# Patient Record
Sex: Female | Born: 1950 | Race: Black or African American | Hispanic: No | Marital: Single | Smoking: Current every day smoker
Health system: Southern US, Community
[De-identification: ages and names within clinical notes are randomized; demographics above are authoritative.]

## PROBLEM LIST (undated history)

## (undated) DIAGNOSIS — C801 Malignant (primary) neoplasm, unspecified: Secondary | ICD-10-CM

## (undated) DIAGNOSIS — M51369 Other intervertebral disc degeneration, lumbar region without mention of lumbar back pain or lower extremity pain: Secondary | ICD-10-CM

## (undated) DIAGNOSIS — F419 Anxiety disorder, unspecified: Secondary | ICD-10-CM

## (undated) DIAGNOSIS — T7840XA Allergy, unspecified, initial encounter: Secondary | ICD-10-CM

## (undated) DIAGNOSIS — E785 Hyperlipidemia, unspecified: Secondary | ICD-10-CM

## (undated) DIAGNOSIS — K219 Gastro-esophageal reflux disease without esophagitis: Secondary | ICD-10-CM

## (undated) DIAGNOSIS — G709 Myoneural disorder, unspecified: Secondary | ICD-10-CM

## (undated) DIAGNOSIS — I10 Essential (primary) hypertension: Secondary | ICD-10-CM

## (undated) DIAGNOSIS — M5136 Other intervertebral disc degeneration, lumbar region: Secondary | ICD-10-CM

## (undated) HISTORY — DX: Gastro-esophageal reflux disease without esophagitis: K21.9

## (undated) HISTORY — DX: Myoneural disorder, unspecified: G70.9

## (undated) HISTORY — PX: CHOLECYSTECTOMY: SHX55

## (undated) HISTORY — DX: Allergy, unspecified, initial encounter: T78.40XA

## (undated) HISTORY — DX: Malignant (primary) neoplasm, unspecified: C80.1

## (undated) HISTORY — DX: Hyperlipidemia, unspecified: E78.5

## (undated) HISTORY — DX: Anxiety disorder, unspecified: F41.9

## (undated) HISTORY — DX: Other intervertebral disc degeneration, lumbar region without mention of lumbar back pain or lower extremity pain: M51.369

## (undated) HISTORY — DX: Other intervertebral disc degeneration, lumbar region: M51.36

## (undated) HISTORY — PX: ABDOMINAL HYSTERECTOMY: SHX81

---

## 2007-12-20 ENCOUNTER — Emergency Department (HOSPITAL_COMMUNITY): Admission: EM | Admit: 2007-12-20 | Discharge: 2007-12-20 | Payer: Self-pay | Admitting: Emergency Medicine

## 2008-09-25 ENCOUNTER — Emergency Department (HOSPITAL_COMMUNITY): Admission: EM | Admit: 2008-09-25 | Discharge: 2008-09-25 | Payer: Self-pay | Admitting: Emergency Medicine

## 2008-10-30 ENCOUNTER — Encounter: Admission: RE | Admit: 2008-10-30 | Discharge: 2008-10-30 | Payer: Self-pay | Admitting: Internal Medicine

## 2009-04-22 ENCOUNTER — Encounter: Admission: RE | Admit: 2009-04-22 | Discharge: 2009-04-22 | Payer: Self-pay | Admitting: Internal Medicine

## 2009-10-08 ENCOUNTER — Inpatient Hospital Stay (HOSPITAL_COMMUNITY): Admission: EM | Admit: 2009-10-08 | Discharge: 2009-10-10 | Payer: Self-pay | Admitting: Emergency Medicine

## 2010-03-24 ENCOUNTER — Inpatient Hospital Stay (HOSPITAL_COMMUNITY): Admission: EM | Admit: 2010-03-24 | Discharge: 2010-03-27 | Payer: Self-pay | Admitting: Emergency Medicine

## 2010-03-25 ENCOUNTER — Encounter (INDEPENDENT_AMBULATORY_CARE_PROVIDER_SITE_OTHER): Payer: Self-pay | Admitting: General Surgery

## 2010-04-12 ENCOUNTER — Emergency Department (HOSPITAL_COMMUNITY): Admission: EM | Admit: 2010-04-12 | Discharge: 2010-04-13 | Payer: Self-pay | Admitting: Emergency Medicine

## 2010-08-04 ENCOUNTER — Observation Stay (HOSPITAL_COMMUNITY)
Admission: EM | Admit: 2010-08-04 | Discharge: 2010-08-08 | Payer: Self-pay | Source: Home / Self Care | Attending: Internal Medicine | Admitting: Internal Medicine

## 2010-08-05 ENCOUNTER — Encounter (INDEPENDENT_AMBULATORY_CARE_PROVIDER_SITE_OTHER): Payer: Self-pay | Admitting: Internal Medicine

## 2010-08-20 ENCOUNTER — Encounter
Admission: RE | Admit: 2010-08-20 | Discharge: 2010-09-09 | Payer: Self-pay | Source: Home / Self Care | Attending: Internal Medicine | Admitting: Internal Medicine

## 2010-09-09 ENCOUNTER — Encounter
Admission: RE | Admit: 2010-09-09 | Discharge: 2010-09-29 | Payer: Self-pay | Source: Home / Self Care | Attending: Internal Medicine | Admitting: Internal Medicine

## 2010-10-02 ENCOUNTER — Other Ambulatory Visit: Payer: Self-pay | Admitting: General Surgery

## 2010-10-02 DIAGNOSIS — E041 Nontoxic single thyroid nodule: Secondary | ICD-10-CM

## 2010-10-15 ENCOUNTER — Other Ambulatory Visit: Payer: Self-pay

## 2010-10-22 ENCOUNTER — Other Ambulatory Visit (HOSPITAL_COMMUNITY)
Admission: RE | Admit: 2010-10-22 | Discharge: 2010-10-22 | Disposition: A | Discharge status: Home / Self Care | Payer: Medicaid Other | Source: Ambulatory Visit | Attending: Diagnostic Radiology | Admitting: Diagnostic Radiology

## 2010-10-22 ENCOUNTER — Ambulatory Visit
Admission: RE | Admit: 2010-10-22 | Discharge: 2010-10-22 | Disposition: A | Discharge status: Home / Self Care | Payer: Medicaid Other | Source: Ambulatory Visit | Attending: General Surgery | Admitting: General Surgery

## 2010-10-22 ENCOUNTER — Other Ambulatory Visit: Payer: Self-pay | Admitting: Diagnostic Radiology

## 2010-10-22 DIAGNOSIS — E041 Nontoxic single thyroid nodule: Secondary | ICD-10-CM

## 2010-10-22 DIAGNOSIS — E049 Nontoxic goiter, unspecified: Secondary | ICD-10-CM | POA: Insufficient documentation

## 2010-11-03 ENCOUNTER — Other Ambulatory Visit (HOSPITAL_COMMUNITY): Payer: Medicaid Other

## 2010-11-07 ENCOUNTER — Encounter (HOSPITAL_COMMUNITY): Payer: Medicaid Other

## 2010-11-07 ENCOUNTER — Other Ambulatory Visit: Payer: Self-pay | Admitting: General Surgery

## 2010-11-07 DIAGNOSIS — Z01818 Encounter for other preprocedural examination: Secondary | ICD-10-CM | POA: Insufficient documentation

## 2010-11-07 DIAGNOSIS — Z01812 Encounter for preprocedural laboratory examination: Secondary | ICD-10-CM | POA: Insufficient documentation

## 2010-11-07 LAB — SURGICAL PCR SCREEN: Staphylococcus aureus: POSITIVE — AB

## 2010-11-07 LAB — CBC
HCT: 38.4 % (ref 36.0–46.0)
MCH: 28.9 pg (ref 26.0–34.0)
MCV: 92.5 fL (ref 78.0–100.0)
Platelets: 231 10*3/uL (ref 150–400)
RBC: 4.15 MIL/uL (ref 3.87–5.11)

## 2010-11-07 LAB — BASIC METABOLIC PANEL
BUN: 23 mg/dL (ref 6–23)
Chloride: 105 mEq/L (ref 96–112)
Creatinine, Ser: 1.64 mg/dL — ABNORMAL HIGH (ref 0.4–1.2)
Glucose, Bld: 179 mg/dL — ABNORMAL HIGH (ref 70–99)

## 2010-11-10 ENCOUNTER — Ambulatory Visit (HOSPITAL_COMMUNITY)
Admission: RE | Admit: 2010-11-10 | Discharge: 2010-11-11 | Disposition: A | Discharge status: Home / Self Care | Payer: Medicaid Other | Source: Ambulatory Visit | Attending: General Surgery | Admitting: General Surgery

## 2010-11-10 DIAGNOSIS — E119 Type 2 diabetes mellitus without complications: Secondary | ICD-10-CM | POA: Insufficient documentation

## 2010-11-10 DIAGNOSIS — Z794 Long term (current) use of insulin: Secondary | ICD-10-CM | POA: Insufficient documentation

## 2010-11-10 DIAGNOSIS — I10 Essential (primary) hypertension: Secondary | ICD-10-CM | POA: Insufficient documentation

## 2010-11-10 DIAGNOSIS — K219 Gastro-esophageal reflux disease without esophagitis: Secondary | ICD-10-CM | POA: Insufficient documentation

## 2010-11-10 DIAGNOSIS — Z01812 Encounter for preprocedural laboratory examination: Secondary | ICD-10-CM | POA: Insufficient documentation

## 2010-11-10 DIAGNOSIS — R110 Nausea: Secondary | ICD-10-CM | POA: Insufficient documentation

## 2010-11-10 DIAGNOSIS — D34 Benign neoplasm of thyroid gland: Secondary | ICD-10-CM | POA: Insufficient documentation

## 2010-11-10 DIAGNOSIS — E669 Obesity, unspecified: Secondary | ICD-10-CM | POA: Insufficient documentation

## 2010-11-10 LAB — GLUCOSE, CAPILLARY
Glucose-Capillary: 192 mg/dL — ABNORMAL HIGH (ref 70–99)
Glucose-Capillary: 166 mg/dL — ABNORMAL HIGH (ref 70–99)
Glucose-Capillary: 269 mg/dL — ABNORMAL HIGH (ref 70–99)

## 2010-11-10 LAB — URINALYSIS, ROUTINE W REFLEX MICROSCOPIC
Bilirubin Urine: NEGATIVE
Glucose, UA: NEGATIVE mg/dL
Hgb urine dipstick: NEGATIVE
Specific Gravity, Urine: 1.017 (ref 1.005–1.030)
Urobilinogen, UA: 1 mg/dL (ref 0.0–1.0)
pH: 5.5 (ref 5.0–8.0)

## 2010-11-11 ENCOUNTER — Other Ambulatory Visit: Payer: Self-pay | Admitting: General Surgery

## 2010-11-11 LAB — CBC
HCT: 34.4 % — ABNORMAL LOW (ref 36.0–46.0)
HCT: 35.2 % — ABNORMAL LOW (ref 36.0–46.0)
HCT: 35.5 % — ABNORMAL LOW (ref 36.0–46.0)
HCT: 37.6 % (ref 36.0–46.0)
Hemoglobin: 11.3 g/dL — ABNORMAL LOW (ref 12.0–15.0)
Hemoglobin: 12 g/dL (ref 12.0–15.0)
MCH: 29.4 pg (ref 26.0–34.0)
MCHC: 32.8 g/dL (ref 30.0–36.0)
MCHC: 33.8 g/dL (ref 30.0–36.0)
MCHC: 33.8 g/dL (ref 30.0–36.0)
MCHC: 34.3 g/dL (ref 30.0–36.0)
MCV: 87 fL (ref 78.0–100.0)
RBC: 3.85 MIL/uL — ABNORMAL LOW (ref 3.87–5.11)
RDW: 14 % (ref 11.5–15.5)
RDW: 14.2 % (ref 11.5–15.5)
WBC: 12.8 10*3/uL — ABNORMAL HIGH (ref 4.0–10.5)

## 2010-11-11 LAB — BASIC METABOLIC PANEL
BUN: 13 mg/dL (ref 6–23)
BUN: 17 mg/dL (ref 6–23)
CO2: 25 mEq/L (ref 19–32)
CO2: 25 mEq/L (ref 19–32)
Calcium: 10.3 mg/dL (ref 8.4–10.5)
Chloride: 107 mEq/L (ref 96–112)
GFR calc Af Amer: 45 mL/min — ABNORMAL LOW (ref >60)
GFR calc Af Amer: 51 mL/min — ABNORMAL LOW (ref >60)
GFR calc non Af Amer: 39 mL/min — ABNORMAL LOW (ref >60)
Glucose, Bld: 155 mg/dL — ABNORMAL HIGH (ref 70–99)
Glucose, Bld: 305 mg/dL — ABNORMAL HIGH (ref 70–99)
Potassium: 4.8 mEq/L (ref 3.5–5.1)
Potassium: 4.8 mEq/L (ref 3.5–5.1)
Sodium: 137 mEq/L (ref 135–145)
Sodium: 138 mEq/L (ref 135–145)
Sodium: 138 mEq/L (ref 135–145)

## 2010-11-11 LAB — GLUCOSE, CAPILLARY
Glucose-Capillary: 108 mg/dL — ABNORMAL HIGH (ref 70–99)
Glucose-Capillary: 130 mg/dL — ABNORMAL HIGH (ref 70–99)
Glucose-Capillary: 174 mg/dL — ABNORMAL HIGH (ref 70–99)
Glucose-Capillary: 177 mg/dL — ABNORMAL HIGH (ref 70–99)
Glucose-Capillary: 224 mg/dL — ABNORMAL HIGH (ref 70–99)
Glucose-Capillary: 313 mg/dL — ABNORMAL HIGH (ref 70–99)
Glucose-Capillary: 83 mg/dL (ref 70–99)
Glucose-Capillary: 351 mg/dL — ABNORMAL HIGH (ref 70–99)

## 2010-11-11 LAB — DIFFERENTIAL
Basophils Absolute: 0 10*3/uL (ref 0.0–0.1)
Basophils Absolute: 0 10*3/uL (ref 0.0–0.1)
Basophils Relative: 0 % (ref 0–1)
Basophils Relative: 0 % (ref 0–1)
Lymphocytes Relative: 23 % (ref 12–46)
Monocytes Absolute: 0.3 10*3/uL (ref 0.1–1.0)
Neutro Abs: 7.9 10*3/uL — ABNORMAL HIGH (ref 1.7–7.7)
Neutro Abs: 9.5 10*3/uL — ABNORMAL HIGH (ref 1.7–7.7)
Neutrophils Relative %: 55 % (ref 43–77)
Neutrophils Relative %: 75 % (ref 43–77)

## 2010-11-11 LAB — COMPREHENSIVE METABOLIC PANEL
ALT: 50 U/L — ABNORMAL HIGH (ref 0–35)
Alkaline Phosphatase: 108 U/L (ref 39–117)
Alkaline Phosphatase: 97 U/L (ref 39–117)
BUN: 18 mg/dL (ref 6–23)
CO2: 23 mEq/L (ref 19–32)
Calcium: 9.8 mg/dL (ref 8.4–10.5)
Chloride: 104 mEq/L (ref 96–112)
Chloride: 98 mEq/L (ref 96–112)
GFR calc non Af Amer: 41 mL/min — ABNORMAL LOW (ref >60)
Glucose, Bld: 124 mg/dL — ABNORMAL HIGH (ref 70–99)
Glucose, Bld: 152 mg/dL — ABNORMAL HIGH (ref 70–99)
Potassium: 4.6 mEq/L (ref 3.5–5.1)
Sodium: 138 mEq/L (ref 135–145)
Total Bilirubin: 0.7 mg/dL (ref 0.3–1.2)
Total Bilirubin: 1 mg/dL (ref 0.3–1.2)

## 2010-11-11 LAB — HEMOGLOBIN A1C: Hgb A1c MFr Bld: 8 % — ABNORMAL HIGH (ref <5.7)

## 2010-11-11 LAB — HEPATIC FUNCTION PANEL
ALT: 54 U/L — ABNORMAL HIGH (ref 0–35)
AST: 42 U/L — ABNORMAL HIGH (ref 0–37)
Bilirubin, Direct: 0.1 mg/dL (ref 0.0–0.3)
Total Bilirubin: 0.7 mg/dL (ref 0.3–1.2)

## 2010-11-11 LAB — CARDIAC PANEL(CRET KIN+CKTOT+MB+TROPI)
CK, MB: 1.1 ng/mL (ref 0.3–4.0)
CK, MB: 1.2 ng/mL (ref 0.3–4.0)
Relative Index: INVALID (ref 0.0–2.5)
Troponin I: 0.02 ng/mL (ref 0.00–0.06)
Troponin I: 0.03 ng/mL (ref 0.00–0.06)

## 2010-11-11 LAB — MAGNESIUM: Magnesium: 1.5 mg/dL (ref 1.5–2.5)

## 2010-11-11 LAB — LIPID PANEL
LDL Cholesterol: 81 mg/dL (ref 0–99)
VLDL: 55 mg/dL — ABNORMAL HIGH (ref 0–40)

## 2010-11-11 LAB — URINE MICROSCOPIC-ADD ON

## 2010-11-11 LAB — URINALYSIS, ROUTINE W REFLEX MICROSCOPIC
Leukocytes, UA: NEGATIVE
Nitrite: NEGATIVE
pH: 5.5 (ref 5.0–8.0)

## 2010-11-11 LAB — CK TOTAL AND CKMB (NOT AT ARMC): Total CK: 76 U/L (ref 7–177)

## 2010-11-11 LAB — POCT PREGNANCY, URINE: Preg Test, Ur: NEGATIVE

## 2010-11-11 NOTE — Op Note (Signed)
NAMEHARVI, CULVER                ACCOUNT NO.:  [id number]  MEDICAL RECORD NO.:  [id number]           PATIENT TYPE:  O  LOCATION:  [id number]                         FACILITY:  [facility]  PHYSICIAN:  Marland Kitchen T. Ajay Strubel, M.D.DATE OF BIRTH:  03/26/1951  DATE OF PROCEDURE: DATE OF DISCHARGE:                              OPERATIVE REPORT   PREOPERATIVE DIAGNOSIS:  Left thyroid nodule.  POSTOPERATIVE DIAGNOSIS:  Left thyroid nodule.  SURGICAL PROCEDURE:  Left thyroid lobectomy.  SURGEON:  Darene Lamer. Valla Pacey, MD  ASSISTANT:  Orson Ape. Rise Patience, MD  ANESTHESIA:  General.  BRIEF HISTORY:  [name] is a 60 year old female who on a recent workup for neck and thoracic pain underwent MRI that incidentally showed approximately 2-cm nodule in the left lower lobe of the thyroid.  We obtained a thyroid ultrasound which confirmed an approximately 2-1/2 cm nodule in the lower pole of the left thyroid lobe.  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  With this constellation of findings, we discussed observation versus thyroidectomy and elected to proceed with thyroid lobectomy and possible total thyroidectomy.  The nature of the procedure, its indications, alternatives, risks of anesthetic complications, bleeding, infection, possible need for delayed surgery based on pathology findings and risks of permanent hoarseness and hypocalcemia have been discussed and understood.  She is now brought to the operating room for the procedure.  DESCRIPTION OF OPERATION:  The patient was brought to the operating room, placed in supine position on the operating table, and general endotracheal anesthesia was introduced.  Her neck was positioned carefully, extended, padded, and the entire neck, upper chest, chin were widely, sterilely prepped and draped.  Correct patient and procedure were verified.  I made a curvilinear incision in the anterior  neck midway between the sternal notch and thyroid cartilage and dissection was carried down the through subcutaneous tissue and platysma using cautery.  Subplatysmal flaps were then raised superiorly up to the thyroid cartilage, inferior down to the sternal notch, and laterally out to the borders of sternocleidomastoid.  The Mahorner retractor was then placed and the strap muscles were divided in the avascular plane along the midline and dissection carried down on the anterior thyroid.  The left lobe was exposed with careful blunt dissection anteriorly.  There was a good size, somewhat firm nodule, palpable in the lower pole. Strap muscles were retracted laterally using careful blunt dissection, anterior thyroid gland was fully exposed.  The middle thyroid vein was identified, dissected free, clipped proximally, and divided with the Harmonic scalpel.  The gland was then further mobilized bluntly and the nodule which was in extreme inferior pole was bluntly mobilized somewhat up out of the neck.  Attention was turned to the upper pole and the superior thyroid vessels were exposed and with careful blunt dissection isolated and were divided between proximal clips and Harmonic scalpel fully mobilizing the upper pole.  Some further dissection was then done around the nodule in the lower pole medially further mobilizing the inferior gland and then attention was turned to the middle thyroid vessels.  The tracheoesophageal groove was  identified and with careful blunt dissection, we identified which we felt was the recurrent laryngeal nerve in the tracheoesophageal groove.  Staying medial to this individual branches of the inferior thyroid artery were divided between baby clips proximally and the Harmonic scalpel mobilizing the thyroid up out of the tracheoesophageal groove.  A very small amount of thyroid tissue was dissected left just over the top of the insertion of the nerve.  Dissection was  continued back toward the trachea and as the suspensory ligament of Gwenlyn Found was approached.  This was divided with cautery, mobilizing the gland and isthmus up off the trachea and the isthmus was divided over toward the right lobe with Harmonic scalpel. The specimen was oriented and sent for frozen section.  Frozen section showed a follicular lesion with no evidence of capsular invasion on the sections identified, I elected to stop that lobectomy, as planned.  The neck was irrigated and complete hemostasis obtained and Fibrillar hemostatic agent placed in the tracheoesophageal groove.  The strap muscles were then closed in the midline with interrupted 3-0 Vicryl. Scarpa fascia was closed with interrupted 3-0 Vicryl.  The skin was closed with subcuticular Monocryl and Dermabond.  Sponge and needle counts were correct.  The patient taken to Recovery in good condition.     Darene Lamer. Coy Rochford, M.D.     [place]  D:  11/10/2010  T:  11/10/2010  Job:  [id number]  Electronically Signed by Excell Seltzer M.D. on 11/11/2010 07:49:00 PM

## 2010-11-14 LAB — COMPREHENSIVE METABOLIC PANEL
BUN: 17 mg/dL (ref 6–23)
CO2: 25 mEq/L (ref 19–32)
Calcium: 9.9 mg/dL (ref 8.4–10.5)
Creatinine, Ser: 1.53 mg/dL — ABNORMAL HIGH (ref 0.4–1.2)
GFR calc Af Amer: 42 mL/min — ABNORMAL LOW (ref >60)
GFR calc non Af Amer: 35 mL/min — ABNORMAL LOW (ref >60)
Glucose, Bld: 180 mg/dL — ABNORMAL HIGH (ref 70–99)
Total Bilirubin: 1.1 mg/dL (ref 0.3–1.2)

## 2010-11-14 LAB — GLUCOSE, CAPILLARY: Glucose-Capillary: 172 mg/dL — ABNORMAL HIGH (ref 70–99)

## 2010-11-14 LAB — DIFFERENTIAL
Basophils Absolute: 0.1 10*3/uL (ref 0.0–0.1)
Lymphocytes Relative: 35 % (ref 12–46)
Lymphs Abs: 5.1 10*3/uL — ABNORMAL HIGH (ref 0.7–4.0)
Neutro Abs: 8 10*3/uL — ABNORMAL HIGH (ref 1.7–7.7)
Neutrophils Relative %: 55 % (ref 43–77)

## 2010-11-14 LAB — URINALYSIS, ROUTINE W REFLEX MICROSCOPIC
Bilirubin Urine: NEGATIVE
Hgb urine dipstick: NEGATIVE
Ketones, ur: NEGATIVE mg/dL
Nitrite: NEGATIVE
pH: 7 (ref 5.0–8.0)

## 2010-11-14 LAB — CBC
Hemoglobin: 12.2 g/dL (ref 12.0–15.0)
MCH: 30.1 pg (ref 26.0–34.0)
MCHC: 34 g/dL (ref 30.0–36.0)
MCV: 88.4 fL (ref 78.0–100.0)

## 2010-11-15 LAB — CBC
HCT: 33.2 % — ABNORMAL LOW (ref 36.0–46.0)
HCT: 35 % — ABNORMAL LOW (ref 36.0–46.0)
HCT: 36.5 % (ref 36.0–46.0)
Hemoglobin: 11.3 g/dL — ABNORMAL LOW (ref 12.0–15.0)
Hemoglobin: 11.6 g/dL — ABNORMAL LOW (ref 12.0–15.0)
Hemoglobin: 12.6 g/dL (ref 12.0–15.0)
MCH: 30.2 pg (ref 26.0–34.0)
MCHC: 33.9 g/dL (ref 30.0–36.0)
MCHC: 34.6 g/dL (ref 30.0–36.0)
MCV: 88.7 fL (ref 78.0–100.0)
MCV: 89.6 fL (ref 78.0–100.0)
RBC: 3.84 MIL/uL — ABNORMAL LOW (ref 3.87–5.11)
RDW: 15 % (ref 11.5–15.5)
RDW: 15 % (ref 11.5–15.5)
WBC: 12.5 10*3/uL — ABNORMAL HIGH (ref 4.0–10.5)
WBC: 13.9 10*3/uL — ABNORMAL HIGH (ref 4.0–10.5)
WBC: 14.8 10*3/uL — ABNORMAL HIGH (ref 4.0–10.5)
WBC: 15.3 10*3/uL — ABNORMAL HIGH (ref 4.0–10.5)

## 2010-11-15 LAB — DIFFERENTIAL
Basophils Absolute: 0 10*3/uL (ref 0.0–0.1)
Basophils Relative: 0 % (ref 0–1)
Eosinophils Absolute: 0.1 10*3/uL (ref 0.0–0.7)
Lymphocytes Relative: 33 % (ref 12–46)
Lymphs Abs: 4.9 10*3/uL — ABNORMAL HIGH (ref 0.7–4.0)
Monocytes Absolute: 0.7 10*3/uL (ref 0.1–1.0)
Neutro Abs: 9.1 10*3/uL — ABNORMAL HIGH (ref 1.7–7.7)

## 2010-11-15 LAB — URINALYSIS, ROUTINE W REFLEX MICROSCOPIC
Glucose, UA: NEGATIVE mg/dL
Nitrite: NEGATIVE
pH: 5 (ref 5.0–8.0)

## 2010-11-15 LAB — COMPREHENSIVE METABOLIC PANEL
ALT: 72 U/L — ABNORMAL HIGH (ref 0–35)
ALT: 83 U/L — ABNORMAL HIGH (ref 0–35)
ALT: 88 U/L — ABNORMAL HIGH (ref 0–35)
AST: 69 U/L — ABNORMAL HIGH (ref 0–37)
AST: 74 U/L — ABNORMAL HIGH (ref 0–37)
Albumin: 4 g/dL (ref 3.5–5.2)
Alkaline Phosphatase: 74 U/L (ref 39–117)
Alkaline Phosphatase: 86 U/L (ref 39–117)
CO2: 22 mEq/L (ref 19–32)
CO2: 28 mEq/L (ref 19–32)
CO2: 29 mEq/L (ref 19–32)
Calcium: 9.9 mg/dL (ref 8.4–10.5)
Chloride: 101 mEq/L (ref 96–112)
Chloride: 97 mEq/L (ref 96–112)
Creatinine, Ser: 1.48 mg/dL — ABNORMAL HIGH (ref 0.4–1.2)
GFR calc Af Amer: 44 mL/min — ABNORMAL LOW (ref >60)
GFR calc Af Amer: 45 mL/min — ABNORMAL LOW (ref >60)
GFR calc non Af Amer: 36 mL/min — ABNORMAL LOW (ref >60)
GFR calc non Af Amer: 38 mL/min — ABNORMAL LOW (ref >60)
GFR calc non Af Amer: 40 mL/min — ABNORMAL LOW (ref >60)
Glucose, Bld: 154 mg/dL — ABNORMAL HIGH (ref 70–99)
Potassium: 4.6 mEq/L (ref 3.5–5.1)
Potassium: 5 mEq/L (ref 3.5–5.1)
Sodium: 131 mEq/L — ABNORMAL LOW (ref 135–145)
Sodium: 138 mEq/L (ref 135–145)
Sodium: 138 mEq/L (ref 135–145)
Total Bilirubin: 0.7 mg/dL (ref 0.3–1.2)
Total Bilirubin: 1.2 mg/dL (ref 0.3–1.2)

## 2010-11-15 LAB — GLUCOSE, CAPILLARY
Glucose-Capillary: 121 mg/dL — ABNORMAL HIGH (ref 70–99)
Glucose-Capillary: 137 mg/dL — ABNORMAL HIGH (ref 70–99)
Glucose-Capillary: 146 mg/dL — ABNORMAL HIGH (ref 70–99)
Glucose-Capillary: 156 mg/dL — ABNORMAL HIGH (ref 70–99)
Glucose-Capillary: 159 mg/dL — ABNORMAL HIGH (ref 70–99)
Glucose-Capillary: 218 mg/dL — ABNORMAL HIGH (ref 70–99)
Glucose-Capillary: 221 mg/dL — ABNORMAL HIGH (ref 70–99)

## 2010-11-15 LAB — BASIC METABOLIC PANEL
BUN: 20 mg/dL (ref 6–23)
CO2: 21 mEq/L (ref 19–32)
Chloride: 102 mEq/L (ref 96–112)
Creatinine, Ser: 1.38 mg/dL — ABNORMAL HIGH (ref 0.4–1.2)
Potassium: 4.2 mEq/L (ref 3.5–5.1)

## 2010-11-19 LAB — DIFFERENTIAL
Basophils Absolute: 0 10*3/uL (ref 0.0–0.1)
Basophils Absolute: 0 10*3/uL (ref 0.0–0.1)
Eosinophils Relative: 1 % (ref 0–5)
Lymphs Abs: 6.8 10*3/uL — ABNORMAL HIGH (ref 0.7–4.0)
Monocytes Absolute: 1 10*3/uL (ref 0.1–1.0)
Monocytes Absolute: 1.1 10*3/uL — ABNORMAL HIGH (ref 0.1–1.0)
Monocytes Relative: 6 % (ref 3–12)
Neutrophils Relative %: 46 % (ref 43–77)

## 2010-11-19 LAB — GLUCOSE, CAPILLARY
Glucose-Capillary: 110 mg/dL — ABNORMAL HIGH (ref 70–99)
Glucose-Capillary: 144 mg/dL — ABNORMAL HIGH (ref 70–99)
Glucose-Capillary: 150 mg/dL — ABNORMAL HIGH (ref 70–99)
Glucose-Capillary: 173 mg/dL — ABNORMAL HIGH (ref 70–99)
Glucose-Capillary: 175 mg/dL — ABNORMAL HIGH (ref 70–99)
Glucose-Capillary: 77 mg/dL (ref 70–99)
Glucose-Capillary: 89 mg/dL (ref 70–99)

## 2010-11-19 LAB — COMPREHENSIVE METABOLIC PANEL
Alkaline Phosphatase: 61 U/L (ref 39–117)
BUN: 32 mg/dL — ABNORMAL HIGH (ref 6–23)
Calcium: 9 mg/dL (ref 8.4–10.5)
GFR calc non Af Amer: 36 mL/min — ABNORMAL LOW (ref >60)
Glucose, Bld: 88 mg/dL (ref 70–99)
Potassium: 4.9 mEq/L (ref 3.5–5.1)
Total Protein: 6.8 g/dL (ref 6.0–8.3)

## 2010-11-19 LAB — CBC
HCT: 33 % — ABNORMAL LOW (ref 36.0–46.0)
HCT: 35.9 % — ABNORMAL LOW (ref 36.0–46.0)
Hemoglobin: 11.3 g/dL — ABNORMAL LOW (ref 12.0–15.0)
MCHC: 34.1 g/dL (ref 30.0–36.0)
MCV: 89.8 fL (ref 78.0–100.0)
Platelets: 275 10*3/uL (ref 150–400)
RDW: 14.7 % (ref 11.5–15.5)
RDW: 14.9 % (ref 11.5–15.5)

## 2010-11-19 LAB — BASIC METABOLIC PANEL
BUN: 12 mg/dL (ref 6–23)
BUN: 30 mg/dL — ABNORMAL HIGH (ref 6–23)
Chloride: 107 mEq/L (ref 96–112)
GFR calc Af Amer: 58 mL/min — ABNORMAL LOW (ref >60)
GFR calc non Af Amer: 33 mL/min — ABNORMAL LOW (ref >60)
GFR calc non Af Amer: 48 mL/min — ABNORMAL LOW (ref >60)
Glucose, Bld: 106 mg/dL — ABNORMAL HIGH (ref 70–99)
Potassium: 4.5 mEq/L (ref 3.5–5.1)
Potassium: 5.2 mEq/L — ABNORMAL HIGH (ref 3.5–5.1)
Sodium: 140 mEq/L (ref 135–145)

## 2010-11-19 LAB — D-DIMER, QUANTITATIVE: D-Dimer, Quant: 0.22 ug/mL-FEU (ref 0.00–0.48)

## 2010-11-19 LAB — URINALYSIS, ROUTINE W REFLEX MICROSCOPIC
Glucose, UA: NEGATIVE mg/dL
Hgb urine dipstick: NEGATIVE
Protein, ur: NEGATIVE mg/dL
Specific Gravity, Urine: 1.016 (ref 1.005–1.030)

## 2010-11-19 LAB — HEPATIC FUNCTION PANEL
ALT: 42 U/L — ABNORMAL HIGH (ref 0–35)
AST: 41 U/L — ABNORMAL HIGH (ref 0–37)
Total Protein: 7.3 g/dL (ref 6.0–8.3)

## 2011-02-05 ENCOUNTER — Emergency Department (HOSPITAL_COMMUNITY): Payer: Medicaid Other

## 2011-02-05 ENCOUNTER — Emergency Department (HOSPITAL_COMMUNITY)
Admission: EM | Admit: 2011-02-05 | Discharge: 2011-02-05 | Disposition: A | Discharge status: Home / Self Care | Payer: Medicaid Other | Attending: Emergency Medicine | Admitting: Emergency Medicine

## 2011-02-05 DIAGNOSIS — K5732 Diverticulitis of large intestine without perforation or abscess without bleeding: Secondary | ICD-10-CM | POA: Insufficient documentation

## 2011-02-05 DIAGNOSIS — Z794 Long term (current) use of insulin: Secondary | ICD-10-CM | POA: Insufficient documentation

## 2011-02-05 DIAGNOSIS — E119 Type 2 diabetes mellitus without complications: Secondary | ICD-10-CM | POA: Insufficient documentation

## 2011-02-05 DIAGNOSIS — K219 Gastro-esophageal reflux disease without esophagitis: Secondary | ICD-10-CM | POA: Insufficient documentation

## 2011-02-05 DIAGNOSIS — R112 Nausea with vomiting, unspecified: Secondary | ICD-10-CM | POA: Insufficient documentation

## 2011-02-05 DIAGNOSIS — I10 Essential (primary) hypertension: Secondary | ICD-10-CM | POA: Insufficient documentation

## 2011-02-05 DIAGNOSIS — E785 Hyperlipidemia, unspecified: Secondary | ICD-10-CM | POA: Insufficient documentation

## 2011-02-05 DIAGNOSIS — R109 Unspecified abdominal pain: Secondary | ICD-10-CM | POA: Insufficient documentation

## 2011-02-05 LAB — DIFFERENTIAL
Basophils Absolute: 0 10*3/uL (ref 0.0–0.1)
Lymphocytes Relative: 40 % (ref 12–46)
Monocytes Relative: 7 % (ref 3–12)
Neutro Abs: 8.5 10*3/uL — ABNORMAL HIGH (ref 1.7–7.7)

## 2011-02-05 LAB — BASIC METABOLIC PANEL
CO2: 24 mEq/L (ref 19–32)
Chloride: 99 mEq/L (ref 96–112)
GFR calc Af Amer: 57 mL/min — ABNORMAL LOW (ref >60)
Sodium: 133 mEq/L — ABNORMAL LOW (ref 135–145)

## 2011-02-05 LAB — URINALYSIS, ROUTINE W REFLEX MICROSCOPIC
Glucose, UA: NEGATIVE mg/dL
Hgb urine dipstick: NEGATIVE
Ketones, ur: NEGATIVE mg/dL
pH: 5.5 (ref 5.0–8.0)

## 2011-02-05 LAB — CBC
Hemoglobin: 12 g/dL (ref 12.0–15.0)
MCH: 28.8 pg (ref 26.0–34.0)
RBC: 4.17 MIL/uL (ref 3.87–5.11)

## 2011-02-17 ENCOUNTER — Ambulatory Visit
Admission: RE | Admit: 2011-02-17 | Discharge: 2011-02-17 | Disposition: A | Discharge status: Home / Self Care | Payer: Medicaid Other | Source: Ambulatory Visit | Attending: Internal Medicine | Admitting: Internal Medicine

## 2011-02-17 ENCOUNTER — Other Ambulatory Visit: Payer: Self-pay | Admitting: Internal Medicine

## 2011-02-17 DIAGNOSIS — R52 Pain, unspecified: Secondary | ICD-10-CM

## 2011-04-08 ENCOUNTER — Encounter (HOSPITAL_COMMUNITY): Payer: Self-pay | Admitting: Radiology

## 2011-04-08 ENCOUNTER — Emergency Department (HOSPITAL_COMMUNITY)
Admission: EM | Admit: 2011-04-08 | Discharge: 2011-04-08 | Disposition: A | Discharge status: Home / Self Care | Payer: Medicaid Other | Attending: Emergency Medicine | Admitting: Emergency Medicine

## 2011-04-08 ENCOUNTER — Emergency Department (HOSPITAL_COMMUNITY): Payer: Medicaid Other

## 2011-04-08 DIAGNOSIS — I10 Essential (primary) hypertension: Secondary | ICD-10-CM | POA: Insufficient documentation

## 2011-04-08 DIAGNOSIS — R630 Anorexia: Secondary | ICD-10-CM | POA: Insufficient documentation

## 2011-04-08 DIAGNOSIS — R109 Unspecified abdominal pain: Secondary | ICD-10-CM | POA: Insufficient documentation

## 2011-04-08 DIAGNOSIS — E119 Type 2 diabetes mellitus without complications: Secondary | ICD-10-CM | POA: Insufficient documentation

## 2011-04-08 DIAGNOSIS — E785 Hyperlipidemia, unspecified: Secondary | ICD-10-CM | POA: Insufficient documentation

## 2011-04-08 DIAGNOSIS — Z79899 Other long term (current) drug therapy: Secondary | ICD-10-CM | POA: Insufficient documentation

## 2011-04-08 DIAGNOSIS — R10819 Abdominal tenderness, unspecified site: Secondary | ICD-10-CM | POA: Insufficient documentation

## 2011-04-08 DIAGNOSIS — Z9889 Other specified postprocedural states: Secondary | ICD-10-CM | POA: Insufficient documentation

## 2011-04-08 DIAGNOSIS — M549 Dorsalgia, unspecified: Secondary | ICD-10-CM | POA: Insufficient documentation

## 2011-04-08 DIAGNOSIS — K219 Gastro-esophageal reflux disease without esophagitis: Secondary | ICD-10-CM | POA: Insufficient documentation

## 2011-04-08 HISTORY — DX: Essential (primary) hypertension: I10

## 2011-04-08 LAB — CBC
HCT: 36.3 % (ref 36.0–46.0)
MCV: 87.5 fL (ref 78.0–100.0)
Platelets: 274 10*3/uL (ref 150–400)
RBC: 4.15 MIL/uL (ref 3.87–5.11)
WBC: 15.8 10*3/uL — ABNORMAL HIGH (ref 4.0–10.5)

## 2011-04-08 LAB — COMPREHENSIVE METABOLIC PANEL
AST: 15 U/L (ref 0–37)
BUN: 21 mg/dL (ref 6–23)
CO2: 28 mEq/L (ref 19–32)
Chloride: 99 mEq/L (ref 96–112)
Creatinine, Ser: 1.7 mg/dL — ABNORMAL HIGH (ref 0.50–1.10)
GFR calc non Af Amer: 31 mL/min — ABNORMAL LOW (ref >60)
Total Bilirubin: 0.5 mg/dL (ref 0.3–1.2)

## 2011-04-08 LAB — DIFFERENTIAL
Basophils Relative: 0 % (ref 0–1)
Eosinophils Relative: 1 % (ref 0–5)
Lymphocytes Relative: 48 % — ABNORMAL HIGH (ref 12–46)
Neutrophils Relative %: 43 % (ref 43–77)

## 2011-04-08 LAB — LIPASE, BLOOD: Lipase: 35 U/L (ref 11–59)

## 2011-04-08 MED ORDER — IOHEXOL 300 MG/ML  SOLN
80.0000 mL | Freq: Once | INTRAMUSCULAR | Status: AC | PRN
  Administered 2011-04-08: 80 mL via INTRAVENOUS

## 2011-05-26 LAB — URINALYSIS, ROUTINE W REFLEX MICROSCOPIC
Glucose, UA: NEGATIVE
Hgb urine dipstick: NEGATIVE
Specific Gravity, Urine: 1.018
pH: 5

## 2011-05-26 LAB — URINE MICROSCOPIC-ADD ON

## 2011-05-26 LAB — POCT I-STAT, CHEM 8
BUN: 36 — ABNORMAL HIGH
Chloride: 101
Creatinine, Ser: 1.8 — ABNORMAL HIGH
Potassium: 4.3
Sodium: 132 — ABNORMAL LOW

## 2011-05-26 LAB — DIFFERENTIAL
Blasts: 0
Metamyelocytes Relative: 0
Monocytes Absolute: 1.1 — ABNORMAL HIGH
Monocytes Relative: 7
Myelocytes: 0
Promyelocytes Absolute: 0
nRBC: 0

## 2011-05-26 LAB — CBC
HCT: 35.3 — ABNORMAL LOW
MCHC: 33.5
Platelets: 262
RDW: 14.2

## 2011-05-26 LAB — URINE CULTURE

## 2012-09-14 ENCOUNTER — Other Ambulatory Visit: Payer: Self-pay | Admitting: Internal Medicine

## 2013-02-01 ENCOUNTER — Encounter (INDEPENDENT_AMBULATORY_CARE_PROVIDER_SITE_OTHER): Payer: Self-pay | Admitting: Ophthalmology

## 2014-05-14 ENCOUNTER — Other Ambulatory Visit: Payer: Self-pay

## 2014-06-05 ENCOUNTER — Other Ambulatory Visit: Payer: Self-pay | Admitting: Internal Medicine

## 2014-06-05 DIAGNOSIS — Z1231 Encounter for screening mammogram for malignant neoplasm of breast: Secondary | ICD-10-CM

## 2014-06-21 ENCOUNTER — Ambulatory Visit: Payer: Medicaid Other

## 2014-07-06 ENCOUNTER — Encounter (HOSPITAL_COMMUNITY): Payer: Self-pay | Admitting: *Deleted

## 2014-07-06 ENCOUNTER — Emergency Department (HOSPITAL_COMMUNITY)
Admission: EM | Admit: 2014-07-06 | Discharge: 2014-07-06 | Disposition: A | Discharge status: Home / Self Care | Payer: Medicaid Other | Attending: Emergency Medicine | Admitting: Emergency Medicine

## 2014-07-06 DIAGNOSIS — Z79899 Other long term (current) drug therapy: Secondary | ICD-10-CM | POA: Insufficient documentation

## 2014-07-06 DIAGNOSIS — Z720 Tobacco use: Secondary | ICD-10-CM | POA: Insufficient documentation

## 2014-07-06 DIAGNOSIS — L02211 Cutaneous abscess of abdominal wall: Secondary | ICD-10-CM | POA: Diagnosis present

## 2014-07-06 DIAGNOSIS — Z794 Long term (current) use of insulin: Secondary | ICD-10-CM | POA: Insufficient documentation

## 2014-07-06 DIAGNOSIS — Z90710 Acquired absence of both cervix and uterus: Secondary | ICD-10-CM | POA: Insufficient documentation

## 2014-07-06 DIAGNOSIS — I10 Essential (primary) hypertension: Secondary | ICD-10-CM | POA: Insufficient documentation

## 2014-07-06 DIAGNOSIS — Z9089 Acquired absence of other organs: Secondary | ICD-10-CM | POA: Insufficient documentation

## 2014-07-06 DIAGNOSIS — E119 Type 2 diabetes mellitus without complications: Secondary | ICD-10-CM | POA: Diagnosis not present

## 2014-07-06 MED ORDER — LIDOCAINE-EPINEPHRINE (PF) 1 %-1:200000 IJ SOLN
10.0000 mL | Freq: Once | INTRAMUSCULAR | Status: AC
  Administered 2014-07-06: 10 mL
  Filled 2014-07-06: qty 30

## 2014-07-06 MED ORDER — ONDANSETRON HCL 4 MG/2ML IJ SOLN
4.0000 mg | Freq: Once | INTRAMUSCULAR | Status: AC
  Administered 2014-07-06: 4 mg via INTRAVENOUS
  Filled 2014-07-06: qty 2

## 2014-07-06 MED ORDER — SULFAMETHOXAZOLE-TRIMETHOPRIM 800-160 MG PO TABS: 2.0000 | ORAL_TABLET | Freq: Two times a day (BID) | ORAL | Status: DC

## 2014-07-06 MED ORDER — HYDROCODONE-ACETAMINOPHEN 5-325 MG PO TABS: 2.0000 | ORAL_TABLET | ORAL | Status: DC | PRN

## 2014-07-06 MED ORDER — CEFAZOLIN SODIUM 1-5 GM-% IV SOLN
1.0000 g | Freq: Once | INTRAVENOUS | Status: AC
  Administered 2014-07-06: 1 g via INTRAVENOUS
  Filled 2014-07-06: qty 50

## 2014-07-06 MED ORDER — MORPHINE SULFATE 4 MG/ML IJ SOLN
4.0000 mg | INTRAMUSCULAR | Status: DC | PRN
  Administered 2014-07-06 (×2): 4 mg via INTRAVENOUS
  Filled 2014-07-06 (×2): qty 1

## 2014-07-06 NOTE — Discharge Instructions (Signed)
Remove 1 inch of gauze each day until the gauze is out.  Return to ER with any worsening.  Abscess An abscess is an infected area that contains a collection of pus and debris.It can occur in almost any part of the body. An abscess is also known as a furuncle or boil. CAUSES  An abscess occurs when tissue gets infected. This can occur from blockage of oil or sweat glands, infection of hair follicles, or a minor injury to the skin. As the body tries to fight the infection, pus collects in the area and creates pressure under the skin. This pressure causes pain. People with weakened immune systems have difficulty fighting infections and get certain abscesses more often.  SYMPTOMS Usually an abscess develops on the skin and becomes a painful mass that is red, warm, and tender. If the abscess forms under the skin, you may feel a moveable soft area under the skin. Some abscesses break open (rupture) on their own, but most will continue to get worse without care. The infection can spread deeper into the body and eventually into the bloodstream, causing you to feel ill.  DIAGNOSIS  Your caregiver will take your medical history and perform a physical exam. A sample of fluid may also be taken from the abscess to determine what is causing your infection. TREATMENT  Your caregiver may prescribe antibiotic medicines to fight the infection. However, taking antibiotics alone usually does not cure an abscess. Your caregiver may need to make a small cut (incision) in the abscess to drain the pus. In some cases, gauze is packed into the abscess to reduce pain and to continue draining the area. HOME CARE INSTRUCTIONS   Only take over-the-counter or prescription medicines for pain, discomfort, or fever as directed by your caregiver.  If you were prescribed antibiotics, take them as directed. Finish them even if you start to feel better.  If gauze is used, follow your caregiver's directions for changing the  gauze.  To avoid spreading the infection:  Keep your draining abscess covered with a bandage.  Wash your hands well.  Do not share personal care items, towels, or whirlpools with others.  Avoid skin contact with others.  Keep your skin and clothes clean around the abscess.  Keep all follow-up appointments as directed by your caregiver. SEEK MEDICAL CARE IF:   You have increased pain, swelling, redness, fluid drainage, or bleeding.  You have muscle aches, chills, or a general ill feeling.  You have a fever. MAKE SURE YOU:   Understand these instructions.  Will watch your condition.  Will get help right away if you are not doing well or get worse. Document Released: 05/27/2005 Document Revised: 02/16/2012 Document Reviewed: 10/30/2011 [facility] Patient Information 2015 [place], [place]. This information is not intended to replace advice given to you by your health care provider. Make sure you discuss any questions you have with your health care provider.

## 2014-07-06 NOTE — ED Provider Notes (Signed)
CSN: [id number]     Arrival date & time 07/06/14  1111 History   First MD Initiated Contact with Patient 07/06/14 1133     Chief Complaint  Patient presents with  . Abscess      HPI  Patient denies of a red painful area adjacent her bellybutton. Present for the last 2 days. "Started as "a pimple". Red and indurated today. No fevers chills nausea vomiting diarrhea or any systemic signs of illness.  Past Medical History  Diagnosis Date  . Hypertension   . Diabetes mellitus    Past Surgical History  Procedure Laterality Date  . Cholecystectomy    . Abdominal hysterectomy     No family history on file. History  Substance Use Topics  . Smoking status: Current Every Day Smoker    Types: Cigarettes  . Smokeless tobacco: Not on file  . Alcohol Use: Yes     Comment: occasionally   OB History    No data available     Review of Systems  Constitutional: Negative for fever, chills, diaphoresis, appetite change and fatigue.  HENT: Negative for mouth sores, sore throat and trouble swallowing.   Eyes: Negative for visual disturbance.  Respiratory: Negative for cough, chest tightness, shortness of breath and wheezing.   Cardiovascular: Negative for chest pain.  Gastrointestinal: Negative for nausea, vomiting, abdominal pain, diarrhea and abdominal distention.  Endocrine: Negative for polydipsia, polyphagia and polyuria.  Genitourinary: Negative for dysuria, frequency and hematuria.  Musculoskeletal: Negative for gait problem.  Skin: Positive for color change. Negative for pallor and rash.       Red tender area next to her bellybutton  Neurological: Negative for dizziness, syncope, light-headedness and headaches.  Hematological: Does not bruise/bleed easily.  Psychiatric/Behavioral: Negative for behavioral problems and confusion.      Allergies  Review of patient's allergies indicates no known allergies.  Home Medications   Prior to Admission medications   Medication Sig  Start Date End Date Taking? Authorizing Provider  amitriptyline (ELAVIL) 25 MG tablet Take 25 mg by mouth at bedtime.   Yes Historical Provider, MD  amLODipine (NORVASC) 10 MG tablet Take 10 mg by mouth daily.   Yes Historical Provider, MD  enalapril (VASOTEC) 20 MG tablet Take 20 mg by mouth 2 (two) times daily.   Yes Historical Provider, MD  gabapentin (NEURONTIN) 300 MG capsule Take 300 mg by mouth 2 (two) times daily.   Yes Historical Provider, MD  hydrochlorothiazide (HYDRODIURIL) 25 MG tablet Take 25 mg by mouth daily.   Yes Historical Provider, MD  insulin NPH Human (NOVOLIN N) 100 UNIT/ML injection Inject 25 Units into the skin 2 (two) times daily.   Yes Historical Provider, MD  metFORMIN (GLUCOPHAGE) 500 MG tablet Take 500 mg by mouth 2 (two) times daily.   Yes Historical Provider, MD  pravastatin (PRAVACHOL) 80 MG tablet Take 80 mg by mouth at bedtime.   Yes Historical Provider, MD  HYDROcodone-acetaminophen (NORCO/VICODIN) 5-325 MG per tablet Take 2 tablets by mouth every 4 (four) hours as needed. 07/06/14   Tanna Furry, MD  lisinopril (PRINIVIL,ZESTRIL) 20 MG tablet Take 20 mg by mouth daily.    Historical Provider, MD  sulfamethoxazole-trimethoprim (SEPTRA DS) 800-160 MG per tablet Take 2 tablets by mouth 2 (two) times daily. 07/06/14   Tanna Furry, MD   BP 132/77 mmHg  Pulse 113  Temp(Src) 99.2 F (37.3 C) (Oral)  Resp 22  Ht 5\' 6"  (1.676 m)  Wt 170 lb (77.111 kg)  BMI  27.45 kg/m2  SpO2 100% Physical Exam  Constitutional: She is oriented to person, place, and time. She appears well-developed and well-nourished. No distress.  HENT:  Head: Normocephalic.  Eyes: Conjunctivae are normal. Pupils are equal, round, and reactive to light. No scleral icterus.  Neck: Normal range of motion. Neck supple. No thyromegaly present.  Cardiovascular: Normal rate and regular rhythm.  Exam reveals no gallop and no friction rub.   No murmur heard. Pulmonary/Chest: Effort normal and breath sounds  normal. No respiratory distress. She has no wheezes. She has no rales.  Abdominal: Soft. Bowel sounds are normal. She exhibits no distension. There is no tenderness. There is no rebound.    Musculoskeletal: Normal range of motion.  Neurological: She is alert and oriented to person, place, and time.  Skin: Skin is warm and dry. No rash noted.  Psychiatric: She has a normal mood and affect. Her behavior is normal.    ED Course  INCISION AND DRAINAGE Date/Time: 07/06/2014 2:26 PM Performed by: Tanna Furry Authorized by: Tanna Furry Consent: Verbal consent obtained. Written consent obtained. Risks and benefits: risks, benefits and alternatives were discussed Consent given by: patient Patient understanding: patient states understanding of the procedure being performed Patient identity confirmed: verbally with patient Time out: Immediately prior to procedure a "time out" was called to verify the correct patient, procedure, equipment, support staff and site/side marked as required. Type: abscess Body area: trunk Location details: abdomen Anesthesia: local infiltration Local anesthetic: lidocaine 1% with epinephrine Anesthetic total: 6 ml Patient sedated: no Scalpel size: 11 Needle gauge: 27. Incision type: single straight Complexity: simple Drainage: purulent Drainage amount: moderate Wound treatment: drain placed Packing material: 1/2 in iodoform gauze Patient tolerance: Patient tolerated the procedure well with no immediate complications   (including critical care time) Labs Review Labs Reviewed - No data to display  Imaging Review No results found.   EKG Interpretation None      MDM   Final diagnoses:  Abdominal wall abscess    Wound dressed. Discharged home. Remove 1 inch of gauze daily until all removed. Return to the emergency room with any worsening symptoms. Orthopedic difficulties managing this at home.    Tanna Furry, MD 07/06/14 [phone]

## 2014-07-06 NOTE — ED Notes (Signed)
Pt presents to ed with c/o abscess to abdomen, umbilical area x 3 days. No fever, chills, no drainage.

## 2014-12-14 ENCOUNTER — Encounter: Payer: Self-pay | Admitting: Cardiovascular Disease

## 2014-12-14 DIAGNOSIS — K219 Gastro-esophageal reflux disease without esophagitis: Secondary | ICD-10-CM | POA: Insufficient documentation

## 2014-12-18 ENCOUNTER — Ambulatory Visit: Payer: Medicaid Other | Admitting: Cardiovascular Disease

## 2015-02-23 ENCOUNTER — Emergency Department (HOSPITAL_COMMUNITY): Payer: Medicaid Other

## 2015-02-23 ENCOUNTER — Encounter (HOSPITAL_COMMUNITY): Payer: Self-pay | Admitting: Emergency Medicine

## 2015-02-23 ENCOUNTER — Emergency Department (HOSPITAL_COMMUNITY)
Admission: EM | Admit: 2015-02-23 | Discharge: 2015-02-23 | Disposition: A | Discharge status: Home / Self Care | Payer: Medicaid Other | Attending: Emergency Medicine | Admitting: Emergency Medicine

## 2015-02-23 DIAGNOSIS — Z79899 Other long term (current) drug therapy: Secondary | ICD-10-CM | POA: Insufficient documentation

## 2015-02-23 DIAGNOSIS — I10 Essential (primary) hypertension: Secondary | ICD-10-CM | POA: Diagnosis not present

## 2015-02-23 DIAGNOSIS — R109 Unspecified abdominal pain: Secondary | ICD-10-CM | POA: Insufficient documentation

## 2015-02-23 DIAGNOSIS — M549 Dorsalgia, unspecified: Secondary | ICD-10-CM | POA: Diagnosis not present

## 2015-02-23 DIAGNOSIS — E785 Hyperlipidemia, unspecified: Secondary | ICD-10-CM | POA: Diagnosis not present

## 2015-02-23 DIAGNOSIS — E119 Type 2 diabetes mellitus without complications: Secondary | ICD-10-CM | POA: Insufficient documentation

## 2015-02-23 DIAGNOSIS — Z792 Long term (current) use of antibiotics: Secondary | ICD-10-CM | POA: Diagnosis not present

## 2015-02-23 DIAGNOSIS — K219 Gastro-esophageal reflux disease without esophagitis: Secondary | ICD-10-CM | POA: Diagnosis not present

## 2015-02-23 DIAGNOSIS — Z794 Long term (current) use of insulin: Secondary | ICD-10-CM | POA: Insufficient documentation

## 2015-02-23 LAB — CBC WITH DIFFERENTIAL/PLATELET
Basophils Absolute: 0 10*3/uL (ref 0.0–0.1)
Basophils Relative: 0 % (ref 0–1)
Eosinophils Absolute: 0.1 10*3/uL (ref 0.0–0.7)
Eosinophils Relative: 1 % (ref 0–5)
HCT: 42.7 % (ref 36.0–46.0)
Hemoglobin: 13.2 g/dL (ref 12.0–15.0)
Lymphocytes Relative: 44 % (ref 12–46)
Lymphs Abs: 6 10*3/uL — ABNORMAL HIGH (ref 0.7–4.0)
MCH: 29.8 pg (ref 26.0–34.0)
MCHC: 30.9 g/dL (ref 30.0–36.0)
MCV: 96.4 fL (ref 78.0–100.0)
Monocytes Absolute: 1.1 10*3/uL — ABNORMAL HIGH (ref 0.1–1.0)
Monocytes Relative: 8 % (ref 3–12)
Neutro Abs: 6.5 10*3/uL (ref 1.7–7.7)
Neutrophils Relative %: 47 % (ref 43–77)
Platelets: 193 10*3/uL (ref 150–400)
RBC: 4.43 MIL/uL (ref 3.87–5.11)
RDW: 14.4 % (ref 11.5–15.5)
WBC: 13.7 10*3/uL — ABNORMAL HIGH (ref 4.0–10.5)

## 2015-02-23 LAB — URINALYSIS, ROUTINE W REFLEX MICROSCOPIC
Bilirubin Urine: NEGATIVE
GLUCOSE, UA: 500 mg/dL — AB
HGB URINE DIPSTICK: NEGATIVE
Ketones, ur: NEGATIVE mg/dL
LEUKOCYTES UA: NEGATIVE
Nitrite: NEGATIVE
PH: 5.5 (ref 5.0–8.0)
Protein, ur: NEGATIVE mg/dL
Specific Gravity, Urine: 1.022 (ref 1.005–1.030)
UROBILINOGEN UA: 0.2 mg/dL (ref 0.0–1.0)

## 2015-02-23 LAB — COMPREHENSIVE METABOLIC PANEL
ALT: 39 U/L (ref 14–54)
ANION GAP: 11 (ref 5–15)
AST: 39 U/L (ref 15–41)
Albumin: 4.4 g/dL (ref 3.5–5.0)
Alkaline Phosphatase: 95 U/L (ref 38–126)
BILIRUBIN TOTAL: 1.2 mg/dL (ref 0.3–1.2)
BUN: 26 mg/dL — ABNORMAL HIGH (ref 6–20)
CO2: 20 mmol/L — ABNORMAL LOW (ref 22–32)
Calcium: 10.3 mg/dL (ref 8.9–10.3)
Chloride: 106 mmol/L (ref 101–111)
Creatinine, Ser: 1.84 mg/dL — ABNORMAL HIGH (ref 0.44–1.00)
GFR calc Af Amer: 32 mL/min — ABNORMAL LOW (ref >60)
GFR calc non Af Amer: 28 mL/min — ABNORMAL LOW (ref >60)
GLUCOSE: 240 mg/dL — AB (ref 65–99)
Potassium: 4.6 mmol/L (ref 3.5–5.1)
SODIUM: 137 mmol/L (ref 135–145)
Total Protein: 8.2 g/dL — ABNORMAL HIGH (ref 6.5–8.1)

## 2015-02-23 LAB — LIPASE, BLOOD: Lipase: 29 U/L (ref 22–51)

## 2015-02-23 MED ORDER — PROMETHAZINE HCL 25 MG PO TABS: 25.0000 mg | ORAL_TABLET | Freq: Four times a day (QID) | ORAL | Status: DC | PRN

## 2015-02-23 MED ORDER — HYDROCODONE-ACETAMINOPHEN 5-325 MG PO TABS: 1.0000 | ORAL_TABLET | ORAL | Status: DC | PRN

## 2015-02-23 MED ORDER — SODIUM CHLORIDE 0.9 % IV BOLUS (SEPSIS)
500.0000 mL | Freq: Once | INTRAVENOUS | Status: AC
  Administered 2015-02-23: 500 mL via INTRAVENOUS

## 2015-02-23 MED ORDER — FENTANYL CITRATE (PF) 100 MCG/2ML IJ SOLN
50.0000 ug | Freq: Once | INTRAMUSCULAR | Status: AC
  Administered 2015-02-23: 50 ug via INTRAVENOUS
  Filled 2015-02-23: qty 2

## 2015-02-23 MED ORDER — IOHEXOL 300 MG/ML  SOLN
50.0000 mL | Freq: Once | INTRAMUSCULAR | Status: AC | PRN
  Administered 2015-02-23: 50 mL via ORAL

## 2015-02-23 MED ORDER — IOHEXOL 300 MG/ML  SOLN
100.0000 mL | Freq: Once | INTRAMUSCULAR | Status: AC | PRN
  Administered 2015-02-23: 80 mL via INTRAVENOUS

## 2015-02-23 MED ORDER — ONDANSETRON HCL 4 MG/2ML IJ SOLN
4.0000 mg | Freq: Once | INTRAMUSCULAR | Status: AC
  Administered 2015-02-23: 4 mg via INTRAVENOUS
  Filled 2015-02-23: qty 2

## 2015-02-23 NOTE — ED Provider Notes (Signed)
CSN: [id number]     Arrival date & time 02/23/15  1322 History   First MD Initiated Contact with Patient 02/23/15 1506     Chief Complaint  Patient presents with  . Back Pain  . Abdominal Pain     (Consider location/radiation/quality/duration/timing/severity/associated sxs/prior Treatment) HPI..... Right-sided abdominal pain with radiation to right flank after an accidental fall approximately 3 weeks ago.  She has been eating without vomiting. No dysuria, fever, chills, hematuria. Review systems positive for acid reflux. Status post cholecystectomy and cesarean section. She has been taking tramadol for pain with minimal relief.  Past Medical History  Diagnosis Date  . Hypertension   . Diabetes mellitus   . Hyperlipidemia   . Gastroesophageal reflux    Past Surgical History  Procedure Laterality Date  . Cholecystectomy    . Abdominal hysterectomy     History reviewed. No pertinent family history. History  Substance Use Topics  . Smoking status: Current Every Day Smoker    Types: Cigarettes  . Smokeless tobacco: Not on file  . Alcohol Use: Yes     Comment: occasionally   OB History    No data available     Review of Systems  All other systems reviewed and are negative.     Allergies  Review of patient's allergies indicates no known allergies.  Home Medications   Prior to Admission medications   Medication Sig Start Date End Date Taking? Authorizing Provider  enalapril (VASOTEC) 20 MG tablet Take 20 mg by mouth 2 (two) times daily.   Yes Historical Provider, MD  gabapentin (NEURONTIN) 300 MG capsule Take 300 mg by mouth 2 (two) times daily.   Yes Historical Provider, MD  hydrochlorothiazide (HYDRODIURIL) 25 MG tablet Take 25 mg by mouth daily.   Yes Historical Provider, MD  insulin aspart protamine- aspart (NOVOLOG MIX 70/30) (70-30) 100 UNIT/ML injection Inject 30 Units into the skin 2 (two) times daily with a meal.   Yes Historical Provider, MD  metFORMIN  (GLUCOPHAGE) 500 MG tablet Take 500 mg by mouth 2 (two) times daily.   Yes Historical Provider, MD  naproxen sodium (ANAPROX) 220 MG tablet Take 440 mg by mouth 2 (two) times daily as needed (pain).   Yes Historical Provider, MD  omeprazole (PRILOSEC) 20 MG capsule Take 20 mg by mouth daily.   Yes Historical Provider, MD  pravastatin (PRAVACHOL) 80 MG tablet Take 80 mg by mouth at bedtime.   Yes Historical Provider, MD  traMADol (ULTRAM) 50 MG tablet Take 50 mg by mouth 2 (two) times daily.   Yes Historical Provider, MD  HYDROcodone-acetaminophen (NORCO/VICODIN) 5-325 MG per tablet Take 1-2 tablets by mouth every 4 (four) hours as needed. 02/23/15   Nat Christen, MD  promethazine (PHENERGAN) 25 MG tablet Take 1 tablet (25 mg total) by mouth every 6 (six) hours as needed for nausea or vomiting. 02/23/15   Nat Christen, MD  sulfamethoxazole-trimethoprim (SEPTRA DS) 800-160 MG per tablet Take 2 tablets by mouth 2 (two) times daily. Patient not taking: Reported on 02/23/2015 07/06/14   Tanna Furry, MD   BP 158/77 mmHg  Pulse 98  Temp(Src) 98.6 F (37 C) (Oral)  Resp 15  SpO2 99% Physical Exam  Constitutional: She is oriented to person, place, and time. She appears well-developed and well-nourished.  HENT:  Head: Normocephalic and atraumatic.  Eyes: Conjunctivae and EOM are normal. Pupils are equal, round, and reactive to light.  Neck: Normal range of motion. Neck supple.  Cardiovascular: Normal rate  and regular rhythm.   Pulmonary/Chest: Effort normal and breath sounds normal.  Abdominal: Soft. Bowel sounds are normal.  Right-sided abdominal tenderness  Musculoskeletal: Normal range of motion.  Neurological: She is alert and oriented to person, place, and time.  Skin: Skin is warm and dry.  Psychiatric: She has a normal mood and affect. Her behavior is normal.  Nursing note and vitals reviewed.   ED Course  Procedures (including critical care time) Labs Review Labs Reviewed  CBC WITH  DIFFERENTIAL/PLATELET - Abnormal; Notable for the following:    WBC 13.7 (*)    Lymphs Abs 6.0 (*)    Monocytes Absolute 1.1 (*)    All other components within normal limits  COMPREHENSIVE METABOLIC PANEL - Abnormal; Notable for the following:    CO2 20 (*)    Glucose, Bld 240 (*)    BUN 26 (*)    Creatinine, Ser 1.84 (*)    Total Protein 8.2 (*)    GFR calc non Af Amer 28 (*)    GFR calc Af Amer 32 (*)    All other components within normal limits  URINALYSIS, ROUTINE W REFLEX MICROSCOPIC (NOT AT [facility]) - Abnormal; Notable for the following:    Glucose, UA 500 (*)    All other components within normal limits  LIPASE, BLOOD    Imaging Review Ct Abdomen Pelvis W Contrast  02/23/2015   CLINICAL DATA:  Fall 3 weeks ago.  Increasing back pain.  EXAM: CT ABDOMEN AND PELVIS WITH CONTRAST  TECHNIQUE: Multidetector CT imaging of the abdomen and pelvis was performed using the standard protocol following bolus administration of intravenous contrast.  CONTRAST:  33mL OMNIPAQUE IOHEXOL 300 MG/ML SOLN, 21mL OMNIPAQUE IOHEXOL 300 MG/ML SOLN  COMPARISON:  04/08/2011  FINDINGS: Scanner malfunction cause the patient have to be scanned in the far delayed phase. This lowers diagnostic sensitivity and specificity.  Lower chest:  Mild calcification inferiorly in the mitral valve.  Hepatobiliary: Cholecystectomy.  No perihepatic ascites.  Pancreas: Unremarkable  Spleen: Unremarkable  Adrenals/Urinary Tract: Stable fullness of both adrenal glands without an overt mass. No renal lesion identified. The delayed contrast phase images demonstrate no filling defect along the urothelium.  Stomach/Bowel: Appendix normal.  Multiple sigmoid colon diverticula.  Vascular/Lymphatic: Aortoiliac atherosclerotic vascular disease. No adenopathy identified.  Reproductive: Uterus absent. Similar appearance the right ovary/adnexa compared to 2012.  Other: No supplemental non-categorized findings.  Musculoskeletal: Lumbar spondylosis and  degenerative disc disease, with intervertebral spurring causing mild to moderate foraminal impingement at L4-5.  IMPRESSION: 1. A specific cause for the patient's continued right flank pain is not identified. 2. Lumbar spondylosis and degenerative disc disease causing bilateral mild to moderate foraminal impingement at L4-5. 3. Other imaging findings of potential clinical significance: Mildly calcified mitral valve ; scattered sigmoid colon diverticula; and aortoiliac atherosclerosis. 4. Sensitivity mildly lower due to scanner malfunction, causing the patient to be scanned in the delayed contrast phase.   Electronically Signed   By: Van Clines M.D.   On: 02/23/2015 18:08     EKG Interpretation None      MDM   Final diagnoses:  Abdominal pain, unspecified abdominal location    White count minimally elevated. Glucose 240. Creatinine 1.84. CT scan shows no acute findings. Patient exhibits no acute abdomen. Test results were discussed with the patient. She has primary care follow-up. Discharge medications Norco and Phenergan 25 mg.    Nat Christen, MD 02/23/15 [phone]

## 2015-02-23 NOTE — ED Notes (Signed)
Patient transported to CT 

## 2015-02-23 NOTE — ED Notes (Signed)
Pt reports she fell 3 weeks ago. For over the past week pt has had increasing back pain radiating around to her abdomen. No emesis or diarrhea

## 2015-02-23 NOTE — ED Notes (Signed)
Dr Cook at bedside

## 2015-02-23 NOTE — Discharge Instructions (Signed)
CT scan showed no acute findings. Medication for pain and nausea. Follow-up your primary care doctor.

## 2015-04-24 ENCOUNTER — Other Ambulatory Visit: Payer: Self-pay | Admitting: Internal Medicine

## 2015-04-24 DIAGNOSIS — Z1231 Encounter for screening mammogram for malignant neoplasm of breast: Secondary | ICD-10-CM

## 2015-12-20 ENCOUNTER — Encounter (HOSPITAL_COMMUNITY): Payer: Self-pay

## 2015-12-20 ENCOUNTER — Emergency Department (HOSPITAL_COMMUNITY)
Admission: EM | Admit: 2015-12-20 | Discharge: 2015-12-20 | Disposition: A | Discharge status: Home / Self Care | Payer: No Typology Code available for payment source | Attending: Emergency Medicine | Admitting: Emergency Medicine

## 2015-12-20 DIAGNOSIS — Z79891 Long term (current) use of opiate analgesic: Secondary | ICD-10-CM | POA: Diagnosis not present

## 2015-12-20 DIAGNOSIS — S161XXA Strain of muscle, fascia and tendon at neck level, initial encounter: Secondary | ICD-10-CM | POA: Diagnosis not present

## 2015-12-20 DIAGNOSIS — E119 Type 2 diabetes mellitus without complications: Secondary | ICD-10-CM | POA: Insufficient documentation

## 2015-12-20 DIAGNOSIS — F17210 Nicotine dependence, cigarettes, uncomplicated: Secondary | ICD-10-CM | POA: Diagnosis not present

## 2015-12-20 DIAGNOSIS — Z90710 Acquired absence of both cervix and uterus: Secondary | ICD-10-CM | POA: Diagnosis not present

## 2015-12-20 DIAGNOSIS — Z9049 Acquired absence of other specified parts of digestive tract: Secondary | ICD-10-CM | POA: Insufficient documentation

## 2015-12-20 DIAGNOSIS — M542 Cervicalgia: Secondary | ICD-10-CM | POA: Diagnosis present

## 2015-12-20 DIAGNOSIS — Y9389 Activity, other specified: Secondary | ICD-10-CM | POA: Insufficient documentation

## 2015-12-20 DIAGNOSIS — I10 Essential (primary) hypertension: Secondary | ICD-10-CM | POA: Insufficient documentation

## 2015-12-20 DIAGNOSIS — Y999 Unspecified external cause status: Secondary | ICD-10-CM | POA: Insufficient documentation

## 2015-12-20 DIAGNOSIS — Y92410 Unspecified street and highway as the place of occurrence of the external cause: Secondary | ICD-10-CM | POA: Diagnosis not present

## 2015-12-20 DIAGNOSIS — Z794 Long term (current) use of insulin: Secondary | ICD-10-CM | POA: Diagnosis not present

## 2015-12-20 DIAGNOSIS — Z791 Long term (current) use of non-steroidal anti-inflammatories (NSAID): Secondary | ICD-10-CM | POA: Diagnosis not present

## 2015-12-20 DIAGNOSIS — M549 Dorsalgia, unspecified: Secondary | ICD-10-CM | POA: Diagnosis not present

## 2015-12-20 DIAGNOSIS — E785 Hyperlipidemia, unspecified: Secondary | ICD-10-CM | POA: Diagnosis not present

## 2015-12-20 DIAGNOSIS — Z7984 Long term (current) use of oral hypoglycemic drugs: Secondary | ICD-10-CM | POA: Diagnosis not present

## 2015-12-20 DIAGNOSIS — Z79899 Other long term (current) drug therapy: Secondary | ICD-10-CM | POA: Diagnosis not present

## 2015-12-20 MED ORDER — CYCLOBENZAPRINE HCL 10 MG PO TABS
5.0000 mg | ORAL_TABLET | Freq: Once | ORAL | Status: AC
Start: 2015-12-20 — End: 2015-12-20
  Administered 2015-12-20: 5 mg via ORAL
  Filled 2015-12-20: qty 1

## 2015-12-20 MED ORDER — METHOCARBAMOL 500 MG PO TABS: 500.0000 mg | ORAL_TABLET | Freq: Two times a day (BID) | ORAL | Status: DC | PRN

## 2015-12-20 MED ORDER — HYDROCODONE-ACETAMINOPHEN 5-325 MG PO TABS: 1.0000 | ORAL_TABLET | Freq: Four times a day (QID) | ORAL | Status: DC | PRN

## 2015-12-20 MED ORDER — ONDANSETRON HCL 4 MG/2ML IJ SOLN: 4.0000 mg | Freq: Once | INTRAMUSCULAR | Status: DC

## 2015-12-20 MED ORDER — OXYCODONE-ACETAMINOPHEN 5-325 MG PO TABS
1.0000 | ORAL_TABLET | Freq: Once | ORAL | Status: AC
  Administered 2015-12-20: 1 via ORAL
  Filled 2015-12-20: qty 1
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
## 2016-05-14 ENCOUNTER — Other Ambulatory Visit: Payer: Self-pay | Admitting: Internal Medicine

## 2016-05-14 ENCOUNTER — Ambulatory Visit
Admission: RE | Admit: 2016-05-14 | Discharge: 2016-05-14 | Disposition: A | Discharge status: Home / Self Care | Payer: Medicaid Other | Source: Ambulatory Visit | Attending: Internal Medicine | Admitting: Internal Medicine

## 2016-05-14 DIAGNOSIS — M25511 Pain in right shoulder: Secondary | ICD-10-CM

## 2016-07-17 ENCOUNTER — Ambulatory Visit: Payer: Medicare Other | Attending: Internal Medicine

## 2016-07-17 DIAGNOSIS — R252 Cramp and spasm: Secondary | ICD-10-CM

## 2016-07-17 DIAGNOSIS — M25611 Stiffness of right shoulder, not elsewhere classified: Secondary | ICD-10-CM | POA: Insufficient documentation

## 2016-07-17 DIAGNOSIS — M25511 Pain in right shoulder: Secondary | ICD-10-CM | POA: Insufficient documentation

## 2016-07-17 DIAGNOSIS — R293 Abnormal posture: Secondary | ICD-10-CM | POA: Diagnosis present
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
## 2016-07-27 ENCOUNTER — Ambulatory Visit: Payer: Medicare Other

## 2016-07-29 ENCOUNTER — Ambulatory Visit: Payer: Medicare Other

## 2016-07-29 DIAGNOSIS — M25611 Stiffness of right shoulder, not elsewhere classified: Secondary | ICD-10-CM

## 2016-07-29 DIAGNOSIS — M25511 Pain in right shoulder: Secondary | ICD-10-CM | POA: Diagnosis not present

## 2016-07-29 DIAGNOSIS — R293 Abnormal posture: Secondary | ICD-10-CM

## 2016-07-29 DIAGNOSIS — R252 Cramp and spasm: Secondary | ICD-10-CM

## 2016-07-29 NOTE — Therapy (Signed)
[place], [place], [address] Phone: [phone]   Fax:  [phone]  Physical Therapy Treatment  Patient Details  Name: Nichole Franklin MRN: [id number] Date of Birth: 08/07/1951 Referring Provider: Harlow Ohms, MD  Encounter Date: 07/29/2016      PT End of Session - 07/29/16 1022    Visit Number 2   Number of Visits 16   Date for PT Re-Evaluation 09/25/16   Authorization Type Medicare   PT Start Time 4270   PT Stop Time 1110   PT Time Calculation (min) 55 min   Activity Tolerance Patient tolerated treatment well   Behavior During Therapy [facility] for tasks assessed/performed      Past Medical History:  Diagnosis Date  . Diabetes mellitus   . Gastroesophageal reflux   . Hyperlipidemia   . Hypertension     Past Surgical History:  Procedure Laterality Date  . ABDOMINAL HYSTERECTOMY    . CHOLECYSTECTOMY      There were no vitals filed for this visit.          [facility] PT Assessment - 07/29/16 0001      AROM   Right Shoulder Extension 45 Degrees   Right Shoulder Flexion 135 Degrees   Right Shoulder ABduction 140 Degrees   Right Shoulder External Rotation 80 Degrees   Right Shoulder Horizontal ABduction 0 Degrees   Right Shoulder Horizontal  ADduction 80 Degrees   Left Shoulder Flexion 135 Degrees  this was RT shoulder at eval   Left Shoulder ABduction 150 Degrees  at eval measure wass in RT shoulder ROM                     [facility] Adult PT Treatment/Exercise - 07/29/16 0001      Exercises   Exercises Shoulder     Shoulder Exercises: Standing   Other Standing Exercises isometrics  flex/abduct/ER /IR /extension 5 x 5 sec demo and cue by PT     Moist Heat Therapy   Number Minutes Moist Heat 12 Minutes   Moist Heat Location Shoulder  RT      Manual Therapy   Manual Therapy Soft tissue mobilization   Soft tissue mobilization tool mobilization to RT shoulder and neck                  PT Education - 07/29/16 1037    Education provided Yes   Education Details isometrics   Person(s) Educated Patient   Methods Explanation;Demonstration;Tactile cues;Verbal cues;Handout   Comprehension Returned demonstration;Verbalized understanding          PT Short Term Goals - 07/17/16 0950      PT SHORT TERM GOAL #1   Title She will be independent with inital HEP   Time 4   Period Weeks   Status New     PT SHORT TERM GOAL #2   Title She will   improve RT shoulder motion to    90 degrees  flexion and abduction .   Time 4   Period Weeks   Status New     PT SHORT TERM GOAL #3   Title 30% or more  decreased pain in RT shoulder   Time 4   Period Weeks   Status New     PT SHORT TERM GOAL #4   Title She will report being able to lift light objets with RT arm to asssit with feeding with RT arm   Time 4  Period Weeks   Status New           PT Long Term Goals - 07/17/16 0947      PT LONG TERM GOAL #1   Title She will be independent with all HEP issued   Time 8   Period Weeks   Status New     PT LONG TERM GOAL #2   Title She will improve RT shoulder motion to equal LT to allow normal self care with RT arm   Time 8   Period Weeks   Status New     PT LONG TERM GOAL #3   Title She will report pain decreased 75% or more with normal self care with RT arm   Time 8   Period Weeks   Status New               Plan - 07/29/16 1054    Clinical Impression Statement She is much improved post injections but still painful at end range and sore after isometrics so I cautioned her to ease off pressure to tolerance.  Pain eased post manual.   PT Treatment/Interventions Iontophoresis 4mg /ml Dexamethasone;Moist Heat;Ultrasound;Patient/family education;Passive range of motion;Taping;Manual techniques;Dry needling;Therapeutic exercise   PT Next Visit Plan Modalties and manual for ROM and PAIN  attempt to initate HEP   NO ELECTRIC STIM as she has not  liked this in past.    PT Home Exercise Plan isometrics   Consulted and Agree with Plan of Care Patient      Patient will benefit from skilled therapeutic intervention in order to improve the following deficits and impairments:  Pain, Decreased strength, Decreased range of motion, Decreased activity tolerance, Impaired UE functional use  Visit Diagnosis: Right shoulder pain, unspecified chronicity  Stiffness of right shoulder, not elsewhere classified  Cramp and spasm  Abnormal posture     Problem List Patient Active Problem List   Diagnosis Date Noted  . Gastroesophageal reflux     Darrel Hoover PT 07/29/2016, 11:07 AM  [facility] [address] [place], [place], [address] Phone: [phone]   Fax:  [phone]  Name: Nichole Franklin MRN: [id number] Date of Birth: 11/24/1950

## 2016-07-29 NOTE — Patient Instructions (Addendum)
Strengthening: Isometric Flexion  Using wall for resistance, press right fist into ball using light pressure. Hold ____ seconds. Repeat ____ times per set. Do ____ sets per session. Do ____ sessions per day.  SHOULDER: Abduction (Isometric)  Use wall as resistance. Press arm against pillow. Keep elbow straight. Hold ___ seconds. ___ reps per set, ___ sets per day, ___ days per week  Extension (Isometric)  Place left bent elbow and back of arm against wall. Press elbow against wall. Hold ____ seconds. Repeat ____ times. Do ____ sessions per day.  Internal Rotation (Isometric)  Place palm of right fist against door frame, with elbow bent. Press fist against door frame. Hold ____ seconds. Repeat ____ times. Do ____ sessions per day.  External Rotation (Isometric)  Place back of left fist against door frame, with elbow bent. Press fist against door frame. Hold ____ seconds. Repeat ____ times. Do ____ sessions per day.  Copyright  VHI. All rights reserved.      ALL EXERC 5-10 REPS 5-10 SEC  DAILY

## 2016-08-03 ENCOUNTER — Ambulatory Visit: Payer: Medicare Other | Attending: Internal Medicine | Admitting: Physical Therapy

## 2016-08-03 DIAGNOSIS — R252 Cramp and spasm: Secondary | ICD-10-CM | POA: Diagnosis present

## 2016-08-03 DIAGNOSIS — R293 Abnormal posture: Secondary | ICD-10-CM | POA: Insufficient documentation

## 2016-08-03 DIAGNOSIS — M25511 Pain in right shoulder: Secondary | ICD-10-CM | POA: Insufficient documentation

## 2016-08-03 DIAGNOSIS — M25611 Stiffness of right shoulder, not elsewhere classified: Secondary | ICD-10-CM | POA: Diagnosis present

## 2016-08-03 NOTE — Patient Instructions (Signed)
SHOULDER: External Rotation - Supine (Cane)   Hold cane with both hands. Rotate arm away from body. Keep elbow on floor and next to body. _10__ reps per set, _2__ sets per day, _7__ days per week Add towel to keep elbow at side.  Copyright  VHI. All rights reserved.  Cane Horizontal - Supine   With straight arms holding cane above shoulders, bring cane out to right, center, out to left, and back to above head. Repeat __10_ times. Do __2_ times per day.  Copyright  VHI. All rights reserved.  Cane Exercise: Flexion   Lie on back, holding cane above chest. Keeping arms as straight as possible, lower cane toward floor beyond head. Hold __5__ seconds. Repeat __10__ times. Do _2___ sessions per day.  http://gt2.exer.us/91   Copyright  VHI. All rights reserved.

## 2016-08-03 NOTE — Therapy (Signed)
[place], [place], [address] Phone: [phone]   Fax:  [phone]  Physical Therapy Treatment  Patient Details  Name: Nichole Franklin MRN: [id number] Date of Birth: February 19, 1951 Referring Provider: Harlow Ohms, MD  Encounter Date: 08/03/2016      PT End of Session - 08/03/16 1105    Visit Number 3   Number of Visits 16   Date for PT Re-Evaluation 09/25/16   Authorization Type Medicare   PT Start Time 1102   PT Stop Time 1150   PT Time Calculation (min) 48 min      Past Medical History:  Diagnosis Date  . Diabetes mellitus   . Gastroesophageal reflux   . Hyperlipidemia   . Hypertension     Past Surgical History:  Procedure Laterality Date  . ABDOMINAL HYSTERECTOMY    . CHOLECYSTECTOMY      There were no vitals filed for this visit.      Subjective Assessment - 08/03/16 1105    Subjective My hand has been hurting    Currently in Pain? No/denies   Aggravating Factors  abduction up to 6/10    Pain Relieving Factors not doing motions that cause pain.             [facility] PT Assessment - 08/03/16 0001      AROM   Right Shoulder Flexion 125 Degrees   Right Shoulder ABduction 90 Degrees   Right Shoulder Internal Rotation --  reach to right buttock   Right Shoulder External Rotation --  Reach to T-2                     [facility] Adult PT Treatment/Exercise - 08/03/16 0001      Shoulder Exercises: Supine   Other Supine Exercises supine cane AAROM chest press, pullovers, ER, Horizontal abduction/ adduction. x 10 each, cues to keep comfortable.      Shoulder Exercises: Standing   Other Standing Exercises isometrics  flex/abduct/ER /IR /extension 5 x 5 sec demo and cue by PT     Shoulder Exercises: Pulleys   Flexion 2 minutes     Moist Heat Therapy   Number Minutes Moist Heat 10 Minutes   Moist Heat Location Shoulder     Manual Therapy   Soft tissue mobilization pain reports  soft tissue is "aggravating" so disc. upper trap work                   PT Short Term Goals - 07/17/16 0950      PT SHORT TERM GOAL #1   Title She will be independent with inital HEP   Time 4   Period Weeks   Status New     PT SHORT TERM GOAL #2   Title She will   improve RT shoulder motion to    90 degrees  flexion and abduction .   Time 4   Period Weeks   Status New     PT SHORT TERM GOAL #3   Title 30% or more  decreased pain in RT shoulder   Time 4   Period Weeks   Status New     PT SHORT TERM GOAL #4   Title She will report being able to lift light objets with RT arm to asssit with feeding with RT arm   Time 4   Period Weeks   Status New           PT Long Term  Goals - 07/17/16 0947      PT LONG TERM GOAL #1   Title She will be independent with all HEP issued   Time 8   Period Weeks   Status New     PT LONG TERM GOAL #2   Title She will improve RT shoulder motion to equal LT to allow normal self care with RT arm   Time 8   Period Weeks   Status New     PT LONG TERM GOAL #3   Title She will report pain decreased 75% or more with normal self care with RT arm   Time 8   Period Weeks   Status New               Plan - 08/03/16 1137    Clinical Impression Statement Pt reports h/o cervical cancer she forgot to mention on eval. She c/o finger/hand pain due to puching into wall for isometric HEP. Instructed pt in using towel roll with much more comfort. Atttempted yellow band which increased pain in IR so disc. Pulleys for flexion tolerated well. Supine cane exercises toerated well if pt keeps in pain free ROM. Given for HEP and asked to stop if painful.    PT Next Visit Plan Modalties and manual for ROM and PAIN  attempt to initate HEP   NO ELECTRIC STIM as she has not liked this in past and H/O CA    PT Home Exercise Plan isometrics; supine cane    Consulted and Agree with Plan of Care Patient      Patient will benefit from skilled  therapeutic intervention in order to improve the following deficits and impairments:  Pain, Decreased strength, Decreased range of motion, Decreased activity tolerance, Impaired UE functional use  Visit Diagnosis: No diagnosis found.     Problem List Patient Active Problem List   Diagnosis Date Noted  . Gastroesophageal reflux     Dorene Ar, [place] 08/03/2016, 2:57 PM  [facility] [address] [place], [place], [address] Phone: [phone]   Fax:  [phone]  Name: Bonita Brindisi MRN: [id number] Date of Birth: 08-16-1951

## 2016-08-05 ENCOUNTER — Ambulatory Visit: Payer: Medicare Other

## 2016-08-05 DIAGNOSIS — R252 Cramp and spasm: Secondary | ICD-10-CM

## 2016-08-05 DIAGNOSIS — R293 Abnormal posture: Secondary | ICD-10-CM

## 2016-08-05 DIAGNOSIS — M25511 Pain in right shoulder: Secondary | ICD-10-CM | POA: Diagnosis not present

## 2016-08-05 DIAGNOSIS — M25611 Stiffness of right shoulder, not elsewhere classified: Secondary | ICD-10-CM

## 2016-08-05 NOTE — Therapy (Addendum)
[place], [place], [address] Phone: [phone]   Fax:  [phone]  Physical Therapy Treatment/ Discharge  Patient Details  Name: Nichole Franklin MRN: [id number] Date of Birth: Dec 24, 1950 Referring Provider: Harlow Ohms, MD  Encounter Date: 08/05/2016      PT End of Session - 08/05/16 1101    Visit Number 4   Number of Visits 16   Date for PT Re-Evaluation 09/25/16   Authorization Type Medicare   PT Start Time 1000   PT Stop Time 1040   PT Time Calculation (min) 40 min   Activity Tolerance Patient tolerated treatment well;No increased pain   Behavior During Therapy WFL for tasks assessed/performed      Past Medical History:  Diagnosis Date  . Diabetes mellitus   . Gastroesophageal reflux   . Hyperlipidemia   . Hypertension     Past Surgical History:  Procedure Laterality Date  . ABDOMINAL HYSTERECTOMY    . CHOLECYSTECTOMY      There were no vitals filed for this visit.      Subjective Assessment - 08/05/16 1010    Subjective May be last day. Doing well carried bags with shopping   Currently in Pain? No/denies            [facility] PT Assessment - 08/05/16 0001      AROM   Right Shoulder Flexion 140 Degrees   Right Shoulder ABduction 143 Degrees   Right Shoulder External Rotation 80 Degrees                     [facility] Adult PT Treatment/Exercise - 08/05/16 0001      Shoulder Exercises: Standing   Other Standing Exercises rockwood yellow band x 12 cued for ROM and tension and isntructed on how to attach at home     Shoulder Exercises: Pulleys   Flexion 2 minutes     She was able to demo other HEP and is doing 2x/day so will have her do these 1x and Rockwood 1x per day She was cautioned to modify reps or tension if pain occurs           PT Education - 08/05/16 1100    Education provided Yes   Education Details ROCKWOOD   Person(s) Educated Patient   Methods  Explanation;Demonstration;Tactile cues;Verbal cues;Handout   Comprehension Returned demonstration;Verbalized understanding          PT Short Term Goals - 08/05/16 1106      PT SHORT TERM GOAL #1   Title She will be independent with inital HEP   Status Achieved     PT SHORT TERM GOAL #2   Title She will   improve RT shoulder motion to    90 degrees  flexion and abduction .   Status Achieved     PT SHORT TERM GOAL #3   Title 30% or more  decreased pain in RT shoulder   Status Achieved     PT SHORT TERM GOAL #4   Title She will report being able to lift light objects with RT arm to assit with feeding with RT arm   Status Achieved           PT Long Term Goals - 08/05/16 1106      PT LONG TERM GOAL #1   Title She will be independent with all HEP issued   Status Achieved     PT LONG TERM GOAL #2   Title She  will improve RT shoulder motion to equal LT to allow normal self care with RT arm   Status Achieved     PT LONG TERM GOAL #3   Title She will report pain decreased 75% or more with normal self care with RT arm   Status Achieved               Plan - 08/05/16 1101    Clinical Impression Statement Pt reports no pain generally and pain only at end range and she was able to use RT arm for normal home tasks. She is still avoiding heavy weight for now. She was able to perform rockwood without pain as long as she did not go to end range with rotation.   She was ready for discharge  but I though she should keep appoinments unless by next week she felt she was good on own and she could cancel appointments if she wanted at that time   PT Treatment/Interventions Iontophoresis 68m/ml Dexamethasone;Moist Heat;Ultrasound;Patient/family education;Passive range of motion;Taping;Manual techniques;Dry needling;Therapeutic exercise   PT Next Visit Plan Continue strength and modalities if neede if pain returns.   PT Home Exercise Plan isometrics; supine cane , rockwood   Consulted  and Agree with Plan of Care Patient      Patient will benefit from skilled therapeutic intervention in order to improve the following deficits and impairments:  Pain, Decreased strength, Decreased range of motion, Decreased activity tolerance, Impaired UE functional use  Visit Diagnosis: Right shoulder pain, unspecified chronicity  Stiffness of right shoulder, not elsewhere classified  Cramp and spasm  Abnormal posture     Problem List Patient Active Problem List   Diagnosis Date Noted  . Gastroesophageal reflux     CDarrel Hoover PT 08/05/2016, 11:07 AM  [facility] [address]Phone: [phone]  Fax:  [phone] Name: Nichole LuepkeMRN: [id number] of Birth: 826-Sep-1952  PHYSICAL THERAPY DISCHARGE SUMMARY  Visits from Start of Care: 4  Current functional level related to goals / functional outcomes: See above  Nichole CBaruchwas called today after no show today's appoinment and reported her arm was doing great and did not need PT anymore    Remaining deficits: See above   Education / Equipment: HEP Plan: Patient agrees to discharge.  Patient goals were met. Patient is being discharged due to meeting the stated rehab goals.  ?????    SDarrel Hoover PT  08/17/16   11:02 AM

## 2016-08-05 NOTE — Patient Instructions (Signed)
Strengthening: Resisted Internal Rotation   Hold tubing in left hand, elbow at side and forearm out. Rotate forearm in across body. Repeat ____ times per set. Do ____ sets per session. Do ____ sessions per day.  http://orth.exer.us/830   Copyright  VHI. All rights reserved.  Strengthening: Resisted External Rotation   Hold tubing in right hand, elbow at side and forearm across body. Rotate forearm out. Repeat ____ times per set. Do ____ sets per session. Do ____ sessions per day.  http://orth.exer.us/828   Copyright  VHI. All rights reserved.  Strengthening: Resisted Flexion   Hold tubing with left arm at side. Pull forward and up. Move shoulder through pain-free range of motion. Repeat ____ times per set. Do ____ sets per session. Do ____ sessions per day.  http://orth.exer.us/824   Copyright  VHI. All rights reserved.  Strengthening: Resisted Extension   Hold tubing in right hand, arm forward. Pull arm back, elbow straight. Repeat ____ times per set. Do ____ sets per session. Do ____ sessions per day.            ALL DONE WITH YELLOW BAND ISSUED AND ALSO RED BAND ISSUED FOR LATER, X 10-20 REPS MODIFYING TENSION AND ROM  AS NEEDED TO NOT HAVE PAIN. DO THESE 1X/DAY AND OTHER EXERCISES 1X/DAY  http://orth.exer.us/832   Copyright  VHI. All rights reserved.

## 2016-08-10 ENCOUNTER — Ambulatory Visit: Payer: Medicare Other | Admitting: Physical Therapy

## 2016-08-12 ENCOUNTER — Ambulatory Visit: Payer: Medicare Other

## 2016-08-17 ENCOUNTER — Ambulatory Visit: Payer: Medicare Other

## 2016-08-19 ENCOUNTER — Ambulatory Visit: Payer: Medicare Other | Admitting: Physical Therapy

## 2016-12-03 ENCOUNTER — Encounter: Payer: Self-pay | Admitting: Internal Medicine

## 2016-12-14 ENCOUNTER — Encounter (INDEPENDENT_AMBULATORY_CARE_PROVIDER_SITE_OTHER): Payer: Self-pay

## 2016-12-14 ENCOUNTER — Other Ambulatory Visit (INDEPENDENT_AMBULATORY_CARE_PROVIDER_SITE_OTHER): Payer: Medicare HMO

## 2016-12-14 ENCOUNTER — Encounter: Payer: Self-pay | Admitting: Physician Assistant

## 2016-12-14 ENCOUNTER — Ambulatory Visit (INDEPENDENT_AMBULATORY_CARE_PROVIDER_SITE_OTHER): Payer: Medicare HMO | Admitting: Physician Assistant

## 2016-12-14 VITALS — BP 140/80 | HR 96 | Ht 66.0 in | Wt 164.4 lb

## 2016-12-14 DIAGNOSIS — Z8719 Personal history of other diseases of the digestive system: Secondary | ICD-10-CM

## 2016-12-14 DIAGNOSIS — R1084 Generalized abdominal pain: Secondary | ICD-10-CM

## 2016-12-14 DIAGNOSIS — I10 Essential (primary) hypertension: Secondary | ICD-10-CM

## 2016-12-14 DIAGNOSIS — E119 Type 2 diabetes mellitus without complications: Secondary | ICD-10-CM | POA: Insufficient documentation

## 2016-12-14 LAB — CBC WITH DIFFERENTIAL/PLATELET
BASOS ABS: 0 10*3/uL (ref 0.0–0.1)
Basophils Relative: 0.3 % (ref 0.0–3.0)
Eosinophils Absolute: 0.1 10*3/uL (ref 0.0–0.7)
Eosinophils Relative: 0.6 % (ref 0.0–5.0)
HCT: 37.2 % (ref 36.0–46.0)
Hemoglobin: 12.1 g/dL (ref 12.0–15.0)
LYMPHS ABS: 4.8 10*3/uL — AB (ref 0.7–4.0)
Lymphocytes Relative: 32.6 % (ref 12.0–46.0)
MCHC: 32.4 g/dL (ref 30.0–36.0)
MCV: 90.2 fl (ref 78.0–100.0)
MONO ABS: 1.1 10*3/uL — AB (ref 0.1–1.0)
Monocytes Relative: 7.3 % (ref 3.0–12.0)
Neutro Abs: 8.7 10*3/uL — ABNORMAL HIGH (ref 1.4–7.7)
Neutrophils Relative %: 59.2 % (ref 43.0–77.0)
Platelets: 276 10*3/uL (ref 150.0–400.0)
RBC: 4.13 Mil/uL (ref 3.87–5.11)
RDW: 14.8 % (ref 11.5–15.5)
WBC: 14.7 10*3/uL — ABNORMAL HIGH (ref 4.0–10.5)

## 2016-12-14 LAB — COMPREHENSIVE METABOLIC PANEL
ALK PHOS: 104 U/L (ref 39–117)
ALT: 20 U/L (ref 0–35)
AST: 24 U/L (ref 0–37)
Albumin: 4.4 g/dL (ref 3.5–5.2)
BUN: 20 mg/dL (ref 6–23)
CO2: 27 mEq/L (ref 19–32)
Calcium: 10.1 mg/dL (ref 8.4–10.5)
Chloride: 107 mEq/L (ref 96–112)
Creatinine, Ser: 1.63 mg/dL — ABNORMAL HIGH (ref 0.40–1.20)
GFR: 40.51 mL/min — ABNORMAL LOW (ref >60.00)
GLUCOSE: 81 mg/dL (ref 70–99)
POTASSIUM: 4.6 meq/L (ref 3.5–5.1)
SODIUM: 140 meq/L (ref 135–145)
TOTAL PROTEIN: 7.9 g/dL (ref 6.0–8.3)
Total Bilirubin: 0.5 mg/dL (ref 0.2–1.2)

## 2016-12-14 LAB — AMYLASE: Amylase: 67 U/L (ref 27–131)

## 2016-12-14 LAB — SEDIMENTATION RATE: SED RATE: 50 mm/h — AB (ref 0–30)

## 2016-12-14 LAB — LIPASE: Lipase: 37 U/L (ref 11.0–59.0)

## 2016-12-14 MED ORDER — OMEPRAZOLE 20 MG PO CPDR: 20.0000 mg | DELAYED_RELEASE_CAPSULE | Freq: Every day | ORAL | 11 refills | Status: DC

## 2016-12-14 NOTE — Progress Notes (Signed)
Subjective:    Patient ID: Nichole Franklin, female    DOB: 05-08-1951, 66 y.o.   MRN: [id number]  HPI Nichole Franklin is a 66 year old African-American female new to GI today, referred by Dr. Debbe Bales. Patient has history of hypertension and adult-onset diabetes mellitus as well as hyperlipidemia. She is status post hysterectomy and cholecystectomy. Patient relates remote history of pancreatitis and apparently also has been told that she has diverticular disease. She had some GI evaluation done several years ago in [place] but does not recall most of the details. Exline She was recently seen by her primary care physician and had complaint of mid abdominal/periumbilical pain for about a month. She was given a course of Cipro and Flagyl for possible diverticular disease but symptoms did not resolve. She describes her pain as being constant and stabbing in nature behind her navel and radiating to her back and sometimes bilaterally into her lower quadrants. She also has a separate pain which she describes as a prickling type of pain along her right hip. That has been present for about a week. There is no radiation down into her leg. She's not had any associated nausea vomiting fever or chills. Her bowel movements have been normal without melena or hematochezia. She does feel worse generally after eating and her weight is down about 8 or 9 pounds. She has been eating very light a lot of soup etc. and says she doesn't have much of an appetite. 9 She denies any regular aspirin or NSAID use. She has been on OTC Prilosec long-term and has prescription for tramadol and Neurontin for pain. She does not think that she's had prior colonoscopy or EGD.  Review of Systems Pertinent positive and negative review of systems were noted in the above HPI section.  All other review of systems was otherwise negative.  Outpatient Encounter Prescriptions as of 12/14/2016  Medication Sig  . DULoxetine (CYMBALTA) 30 MG capsule  Take 30 mg by mouth daily.  . enalapril (VASOTEC) 20 MG tablet Take 20 mg by mouth 2 (two) times daily.  Marland Kitchen gabapentin (NEURONTIN) 300 MG capsule Take 300 mg by mouth 3 (three) times daily.   . hydrochlorothiazide (HYDRODIURIL) 25 MG tablet Take 25 mg by mouth daily.  . metFORMIN (GLUCOPHAGE) 500 MG tablet Take 500 mg by mouth 2 (two) times daily.  . naproxen sodium (ANAPROX) 220 MG tablet Take 440 mg by mouth 2 (two) times daily as needed (pain).  Marland Kitchen omeprazole (PRILOSEC) 20 MG capsule Take 20 mg by mouth daily.  . pravastatin (PRAVACHOL) 80 MG tablet Take 80 mg by mouth at bedtime.  . traMADol (ULTRAM) 50 MG tablet Take 50 mg by mouth 2 (two) times daily.  Marland Kitchen omeprazole (PRILOSEC) 20 MG capsule Take 1 capsule (20 mg total) by mouth daily.  . [DISCONTINUED] HYDROcodone-acetaminophen (NORCO/VICODIN) 5-325 MG tablet Take 1 tablet by mouth every 6 (six) hours as needed for severe pain.  . [DISCONTINUED] insulin aspart protamine- aspart (NOVOLOG MIX 70/30) (70-30) 100 UNIT/ML injection Inject 30 Units into the skin 2 (two) times daily with a meal.  . [DISCONTINUED] methocarbamol (ROBAXIN) 500 MG tablet Take 1 tablet (500 mg total) by mouth 2 (two) times daily as needed for muscle spasms. (Patient not taking: Reported on 07/17/2016)  . [DISCONTINUED] promethazine (PHENERGAN) 25 MG tablet Take 1 tablet (25 mg total) by mouth every 6 (six) hours as needed for nausea or vomiting. (Patient not taking: Reported on 07/17/2016)  . [DISCONTINUED] sulfamethoxazole-trimethoprim (SEPTRA DS) 800-160 MG  per tablet Take 2 tablets by mouth 2 (two) times daily. (Patient not taking: Reported on 07/17/2016)   No facility-administered encounter medications on file as of 12/14/2016.    No Known Allergies Patient Active Problem List   Diagnosis Date Noted  . Gastroesophageal reflux    Social History   Social History  . Marital status: Single    Spouse name: N/A  . Number of children: N/A  . Years of education: N/A     Occupational History  . Not on file.   Social History Main Topics  . Smoking status: Current Every Day Smoker    Types: Cigarettes  . Smokeless tobacco: Never Used     Comment: form given 12/14/16   . Alcohol use No  . Drug use: No  . Sexual activity: Not on file   Other Topics Concern  . Not on file   Social History Narrative  . No narrative on file    Ms. Lavalais's family history includes Breast cancer in her sister; Diabetes in her brother and sister.      Objective:    Vitals:   12/14/16 0858  BP: 140/80  Pulse: 96    Physical Exam  well-developed older African-American female in no acute distress, blood pressure 140/80 pulse 96, height 5 foot 6, weight 164, BMI 26.5. HEENT; nontraumatic normocephalic EOMI PERRLA sclera anicteric, Cardiovascular ;regular rate and rhythm with S1-S2 no murmur or gallop, Pulmonary ;clear bilaterally, Abdomen ;obese soft she has some mild rather generalized tenderness and mid abdominal tenderness there is no guarding or rebound no palpable mass or hepatosplenomegaly bowel sounds are present, Rectal; exam not done, Ext; no clubbing cyanosis or edema skin warm and dry, Neuropsych ;mood and affect appropriate       Assessment & Plan:   #57 66 year old African-American female with 4-6 week history of. Umbilical and mid abdominal pain described as stabbing sometimes radiating into the lower abdomen. No associated nausea vomiting or change in bowel habits. Appetite is been decreased and symptoms worse postprandially. Etiology of pain is not clear. Fairly patient does have prior history of pancreatitis #2 adult-onset diabetes mellitus #3 hypertension #4 hyperlipidemia #5 status post cholecystectomy  Plan; Will schedule for CT of the abdomen and pelvis with contrast CBC with differential, see met amylase and lipase She will use tramadol as needed  sent  prescription for Prilosec 20 mg by mouth every morning. Further plans pending CT and labs.  If unrevealing she will need colonoscopy and EGD with Dr. Fuller Plan. [place] Peifer S Takota Cahalan PA-C 12/14/2016   Cc: Elwyn Reach, MD

## 2016-12-14 NOTE — Patient Instructions (Addendum)
You have been given a separate informational sheet regarding your tobacco use, the importance of quitting and local resources to help you quit.  We have sent the following medications to your pharmacy for you to pick up at your convenience: Prilosec 20 mg daily  Take you Tramadol 2-3 times a day as needed for pain.   Your physician has requested that you go to the basement for lab work before leaving today.  You have been scheduled for a CT scan of the abdomen and pelvis at [facility] ([address]---this is in the same building as [facility]).   You are scheduled on 12-18-16 at 9:30 am. You should arrive 15 minutes prior to your appointment time for registration. Please follow the written instructions below on the day of your exam:  WARNING: IF YOU ARE ALLERGIC TO IODINE/X-RAY DYE, PLEASE NOTIFY RADIOLOGY IMMEDIATELY AT [phone]! YOU WILL BE GIVEN A 13 HOUR PREMEDICATION PREP.  1) Do not eat or drink anything after 5:30 am (4 hours prior to your test) 2) You have been given 2 bottles of oral contrast to drink. The solution may taste               better if refrigerated, but do NOT add ice or any other liquid to this solution. Shake             well before drinking.    Drink 1 bottle of contrast @ 7:30 am (2 hours prior to your exam)  Drink 1 bottle of contrast @ 8:30 am (1 hour prior to your exam)  You may take any medications as prescribed with a small amount of water except for the following: Metformin, Glucophage, Glucovance, Avandamet, Riomet, Fortamet, Actoplus Met, Janumet, Glumetza or Metaglip. The above medications must be held the day of the exam AND 48 hours after the exam.  The purpose of you drinking the oral contrast is to aid in the visualization of your intestinal tract. The contrast solution may cause some diarrhea. Before your exam is started, you will be given a small amount of fluid to drink. Depending on your individual set of symptoms, you may  also receive an intravenous injection of x-ray contrast/dye. Plan on being at [facility] for 30 minutes or longer, depending on the type of exam you are having performed.  This test typically takes 30-45 minutes to complete.  If you have any questions regarding your exam or if you need to reschedule, you may call the CT department at [phone] between the hours of 8:00 am and 5:00 pm, Monday-Friday.  ________________________________________________________________________

## 2016-12-14 NOTE — Progress Notes (Signed)
Reviewed and agree with initial management plan.  Grove Defina T. Yara Tomkinson, MD FACG 

## 2016-12-18 ENCOUNTER — Ambulatory Visit (INDEPENDENT_AMBULATORY_CARE_PROVIDER_SITE_OTHER)
Admission: RE | Admit: 2016-12-18 | Discharge: 2016-12-18 | Disposition: A | Discharge status: Home / Self Care | Payer: Medicare HMO | Source: Ambulatory Visit | Attending: Physician Assistant | Admitting: Physician Assistant

## 2016-12-18 DIAGNOSIS — R1084 Generalized abdominal pain: Secondary | ICD-10-CM

## 2016-12-18 DIAGNOSIS — Z8719 Personal history of other diseases of the digestive system: Secondary | ICD-10-CM

## 2016-12-22 ENCOUNTER — Telehealth: Payer: Self-pay

## 2016-12-22 NOTE — Telephone Encounter (Signed)
-----   Message from Alfredia Ferguson, PA-C sent at 12/21/2016  9:00 PM EDT ----- Please let pt know the CT scan  Does not show anything in her abdomen to account for pain- pancreas looks fine, she has a small abdominal wall hernia, and has severe degenerative disc changes in very low back

## 2016-12-22 NOTE — Telephone Encounter (Signed)
Aware of her Ct results. She continues to have pain and no appetite. Please advise of the next step. She is taking Tramadol and Prilosec.

## 2016-12-22 NOTE — Telephone Encounter (Signed)
Please ask her to inccrease prilosec to one 20 mg  Twice daily, we discussed need for EGD and Colonoscopy with Dr Fuller Plan if CT negative- lets go ahead and schedule for both- thanks

## 2016-12-23 ENCOUNTER — Other Ambulatory Visit: Payer: Self-pay

## 2016-12-23 MED ORDER — OMEPRAZOLE 20 MG PO CPDR: 20.0000 mg | DELAYED_RELEASE_CAPSULE | Freq: Two times a day (BID) | ORAL | 0 refills | Status: DC

## 2016-12-23 NOTE — Telephone Encounter (Signed)
9:44 am Called to the patient to discuss the plan with her. She states I should not be calling so early. Declines offer to call her later in the day. Verified her current dose of Omeprazole and explained the new dosing. Confirmed her pharmacy and explained I would send in a new Rx with new instructions so she would be able to get a refill. Not certain she understood this because she again became argumentative.  Agrees to the scheduling of the procedures. Advised of dates and she agrees to the dates as scheduled. Appointment cards mailed.

## 2016-12-25 ENCOUNTER — Ambulatory Visit: Payer: Medicare Other | Admitting: Internal Medicine

## 2016-12-29 ENCOUNTER — Encounter: Payer: Medicare HMO | Admitting: Gastroenterology

## 2017-01-14 ENCOUNTER — Ambulatory Visit (AMBULATORY_SURGERY_CENTER): Payer: Self-pay | Admitting: *Deleted

## 2017-01-14 ENCOUNTER — Encounter: Payer: Self-pay | Admitting: *Deleted

## 2017-01-14 VITALS — Ht 66.0 in | Wt 165.0 lb

## 2017-01-14 DIAGNOSIS — R1084 Generalized abdominal pain: Secondary | ICD-10-CM

## 2017-01-14 MED ORDER — NA SULFATE-K SULFATE-MG SULF 17.5-3.13-1.6 GM/177ML PO SOLN: 1.0000 | Freq: Once | ORAL | 0 refills | Status: AC

## 2017-01-14 NOTE — Progress Notes (Signed)
No egg or soy allergy known to patient  No issues with past sedation with any surgeries  or procedures, no intubation problems  No diet pills per patient No home 02 use per patient  No blood thinners per patient  Pt denies issues with constipation  No A fib or A flutter  EMMI video not sent as pt has no e mail

## 2017-01-18 ENCOUNTER — Telehealth: Payer: Self-pay | Admitting: Gastroenterology

## 2017-01-18 NOTE — Telephone Encounter (Signed)
Returned patients call. Informed the patient that Nichole Franklin would call her when she returns to work. Patient was asked if I could help her with anything, and she said that she just needed to speak to [facility]. I told her I would give her the message.

## 2017-01-22 ENCOUNTER — Encounter: Payer: Medicare HMO | Admitting: Gastroenterology

## 2017-02-01 ENCOUNTER — Encounter: Payer: Medicare HMO | Admitting: Gastroenterology

## 2017-02-08 ENCOUNTER — Other Ambulatory Visit: Payer: Self-pay | Admitting: Physician Assistant

## 2017-03-15 ENCOUNTER — Other Ambulatory Visit: Payer: Self-pay | Admitting: Physician Assistant

## 2017-05-08 ENCOUNTER — Emergency Department (HOSPITAL_COMMUNITY)
Admission: EM | Admit: 2017-05-08 | Discharge: 2017-05-08 | Disposition: A | Discharge status: Home / Self Care | Payer: Medicare HMO | Attending: Emergency Medicine | Admitting: Emergency Medicine

## 2017-05-08 ENCOUNTER — Encounter (HOSPITAL_COMMUNITY): Payer: Self-pay

## 2017-05-08 ENCOUNTER — Emergency Department (HOSPITAL_COMMUNITY): Payer: Medicare HMO

## 2017-05-08 DIAGNOSIS — Z79899 Other long term (current) drug therapy: Secondary | ICD-10-CM | POA: Diagnosis not present

## 2017-05-08 DIAGNOSIS — R1012 Left upper quadrant pain: Secondary | ICD-10-CM

## 2017-05-08 DIAGNOSIS — I10 Essential (primary) hypertension: Secondary | ICD-10-CM | POA: Diagnosis not present

## 2017-05-08 DIAGNOSIS — R1032 Left lower quadrant pain: Secondary | ICD-10-CM | POA: Insufficient documentation

## 2017-05-08 DIAGNOSIS — R1110 Vomiting, unspecified: Secondary | ICD-10-CM | POA: Diagnosis present

## 2017-05-08 DIAGNOSIS — R197 Diarrhea, unspecified: Secondary | ICD-10-CM | POA: Diagnosis not present

## 2017-05-08 DIAGNOSIS — Z794 Long term (current) use of insulin: Secondary | ICD-10-CM | POA: Diagnosis not present

## 2017-05-08 DIAGNOSIS — R112 Nausea with vomiting, unspecified: Secondary | ICD-10-CM | POA: Diagnosis not present

## 2017-05-08 DIAGNOSIS — Z8541 Personal history of malignant neoplasm of cervix uteri: Secondary | ICD-10-CM | POA: Diagnosis not present

## 2017-05-08 DIAGNOSIS — F17210 Nicotine dependence, cigarettes, uncomplicated: Secondary | ICD-10-CM | POA: Insufficient documentation

## 2017-05-08 DIAGNOSIS — E119 Type 2 diabetes mellitus without complications: Secondary | ICD-10-CM | POA: Insufficient documentation

## 2017-05-08 LAB — CBC
HCT: 38 % (ref 36.0–46.0)
Hemoglobin: 12.1 g/dL (ref 12.0–15.0)
MCH: 27.8 pg (ref 26.0–34.0)
MCHC: 31.8 g/dL (ref 30.0–36.0)
MCV: 87.4 fL (ref 78.0–100.0)
Platelets: 308 10*3/uL (ref 150–400)
RBC: 4.35 MIL/uL (ref 3.87–5.11)
RDW: 14.6 % (ref 11.5–15.5)
WBC: 17.9 10*3/uL — ABNORMAL HIGH (ref 4.0–10.5)

## 2017-05-08 LAB — LIPASE, BLOOD: Lipase: 46 U/L (ref 11–51)

## 2017-05-08 LAB — COMPREHENSIVE METABOLIC PANEL
ALT: 32 U/L (ref 14–54)
AST: 42 U/L — ABNORMAL HIGH (ref 15–41)
Albumin: 4.3 g/dL (ref 3.5–5.0)
Alkaline Phosphatase: 157 U/L — ABNORMAL HIGH (ref 38–126)
Anion gap: 15 (ref 5–15)
BUN: 39 mg/dL — ABNORMAL HIGH (ref 6–20)
CO2: 19 mmol/L — ABNORMAL LOW (ref 22–32)
Calcium: 10.7 mg/dL — ABNORMAL HIGH (ref 8.9–10.3)
Chloride: 96 mmol/L — ABNORMAL LOW (ref 101–111)
Creatinine, Ser: 2.61 mg/dL — ABNORMAL HIGH (ref 0.44–1.00)
GFR calc Af Amer: 21 mL/min — ABNORMAL LOW (ref >60)
GFR calc non Af Amer: 18 mL/min — ABNORMAL LOW (ref >60)
Glucose, Bld: 271 mg/dL — ABNORMAL HIGH (ref 65–99)
Potassium: 4.4 mmol/L (ref 3.5–5.1)
Sodium: 130 mmol/L — ABNORMAL LOW (ref 135–145)
Total Bilirubin: 1.3 mg/dL — ABNORMAL HIGH (ref 0.3–1.2)
Total Protein: 8.7 g/dL — ABNORMAL HIGH (ref 6.5–8.1)

## 2017-05-08 LAB — CBG MONITORING, ED: Glucose-Capillary: 242 mg/dL — ABNORMAL HIGH (ref 65–99)

## 2017-05-08 LAB — POC OCCULT BLOOD, ED: Fecal Occult Bld: NEGATIVE

## 2017-05-08 MED ORDER — DIPHENHYDRAMINE HCL 50 MG/ML IJ SOLN
12.5000 mg | Freq: Once | INTRAMUSCULAR | Status: AC
  Administered 2017-05-08: 12.5 mg via INTRAVENOUS
  Filled 2017-05-08: qty 1

## 2017-05-08 MED ORDER — MORPHINE SULFATE (PF) 4 MG/ML IV SOLN
4.0000 mg | Freq: Once | INTRAVENOUS | Status: AC
  Administered 2017-05-08: 4 mg via INTRAVENOUS
  Filled 2017-05-08: qty 1

## 2017-05-08 MED ORDER — ONDANSETRON HCL 4 MG/2ML IJ SOLN
4.0000 mg | Freq: Once | INTRAMUSCULAR | Status: AC
  Administered 2017-05-08: 4 mg via INTRAVENOUS
  Filled 2017-05-08: qty 2

## 2017-05-08 MED ORDER — SODIUM CHLORIDE 0.9 % IV BOLUS (SEPSIS)
1000.0000 mL | Freq: Once | INTRAVENOUS | Status: AC
  Administered 2017-05-08: 1000 mL via INTRAVENOUS

## 2017-05-08 MED ORDER — METOCLOPRAMIDE HCL 5 MG/ML IJ SOLN
10.0000 mg | Freq: Once | INTRAMUSCULAR | Status: AC
  Administered 2017-05-08: 10 mg via INTRAVENOUS
  Filled 2017-05-08: qty 2

## 2017-05-08 MED ORDER — METOCLOPRAMIDE HCL 10 MG PO TABS: 10.0000 mg | ORAL_TABLET | Freq: Four times a day (QID) | ORAL | 0 refills | Status: AC

## 2017-05-08 NOTE — ED Notes (Signed)
Pt stable, ambulatory, states understanding of discharge instructions 

## 2017-05-08 NOTE — ED Triage Notes (Signed)
Per Pt, Pt is coming from home with N/V/D that has lasted since Saturday. Pt reports that the both have been black in color along with having LLQ abdominal pain.

## 2017-05-08 NOTE — ED Provider Notes (Signed)
[facility] DEPT Provider Note   CSN: [id number] Arrival date & time: 05/08/17  0849     History   Chief Complaint Chief Complaint  Patient presents with  . Emesis    HPI Nichole Franklin is a 66 y.o. female who presents with abdominal pain, N/V/D. PMH significant for insulin dependent Type 2 DM, HTN, HLD, GERD, arthritis. She states that she started to have nausea, vomiting, diarrhea for the past 5 days. It has not been every day but last night got worse so she came. She reports vomit and diarrhea is black. She is not have loose stool it is mostly "black flecks". Abdominal pain is in the LUQ and is chronic. She has been evaluated by her PCP for this and had a CT scan in April 2018 which was unremarkable. She was supposed to f/u with GI which she has not done. No hx of EGD or colonoscopy. She denies fever but has chills. She reports decreased appetite and generalized weakness. Past surgical hx significant for abdominal hysterectomy and cholecystectomy.   HPI  Past Medical History:  Diagnosis Date  . Allergy    mild  . Anxiety   . Cancer (HCC)    cervical cancer  . Degenerative disc disease, lumbar   . Diabetes mellitus   . Gastroesophageal reflux   . Hyperlipidemia   . Hypertension   . Neuromuscular disorder ([place])    small hiatal hernia    Patient Active Problem List   Diagnosis Date Noted  . HTN (hypertension) 12/14/2016  . Diabetes ([place]) 12/14/2016  . Gastroesophageal reflux     Past Surgical History:  Procedure Laterality Date  . ABDOMINAL HYSTERECTOMY    . CHOLECYSTECTOMY      OB History    No data available       Home Medications    Prior to Admission medications   Medication Sig Start Date End Date Taking? Authorizing Provider  DULoxetine (CYMBALTA) 30 MG capsule Take 30 mg by mouth daily.    [provider]  enalapril (VASOTEC) 20 MG tablet Take 20 mg by mouth 2 (two) times daily.    [provider]  gabapentin (NEURONTIN) 300 MG  capsule Take 300 mg by mouth 2 (two) times daily.     [provider]  hydrochlorothiazide (HYDRODIURIL) 25 MG tablet Take 25 mg by mouth daily.    [provider]  metFORMIN (GLUCOPHAGE) 500 MG tablet Take 500 mg by mouth 2 (two) times daily.    [provider]  naproxen sodium (ANAPROX) 220 MG tablet Take 440 mg by mouth 2 (two) times daily as needed (pain).    [provider]  NOVOLIN 70/30 RELION (70-30) 100 UNIT/ML injection Inject 30 Units into the skin 2 (two) times daily with a meal.  11/21/16   [provider]  omeprazole (PRILOSEC) 20 MG capsule Take 1 capsule (20 mg total) by mouth 2 (two) times daily before a meal. 03/15/17   Ladene Artist, MD  pravastatin (PRAVACHOL) 80 MG tablet Take 80 mg by mouth at bedtime.    [provider]  [place] .3CC/29G 29G X 1/2" 0.3 ML MISC  11/16/16   [provider]  traMADol (ULTRAM) 50 MG tablet Take 50 mg by mouth 2 (two) times daily.    [provider]    Family History Family History  Problem Relation Age of Onset  . Breast cancer Sister   . Diabetes Sister        x  3  . Diabetes Brother   . Colon cancer Neg Hx   . Esophageal cancer Neg Hx   . Pancreatic cancer Neg Hx   . Stomach cancer Neg Hx   . Colon polyps Neg Hx   . Rectal cancer Neg Hx     Social History Social History  Substance Use Topics  . Smoking status: Current Every Day Smoker    Packs/day: 0.25    Types: Cigarettes  . Smokeless tobacco: Never Used     Comment: form given 12/14/16 -3-6 cigs a day   . Alcohol use No     Allergies   Patient has no known allergies.   Review of Systems Review of Systems  Constitutional: Positive for appetite change and chills. Negative for fever.  Respiratory: Negative for shortness of breath.   Cardiovascular: Negative for chest pain.  Gastrointestinal: Positive for abdominal pain, diarrhea, nausea and vomiting. Negative for constipation.    Genitourinary: Negative for dysuria, frequency and vaginal discharge.  Neurological: Positive for weakness (generalized).     Physical Exam Updated Vital Signs BP (!) 149/87 (BP Location: Right Arm)   Pulse 99   Temp 98.5 F (36.9 C) (Oral)   Resp 16   Ht 5\' 6"  (1.676 m)   Wt 68 kg (150 lb)   SpO2 100%   BMI 24.21 kg/m   Physical Exam  Constitutional: She is oriented to person, place, and time. She appears well-developed and well-nourished. She appears distressed (uncomfortable; rolling back and forth in the bed).  HENT:  Head: Normocephalic and atraumatic.  Eyes: Pupils are equal, round, and reactive to light. Conjunctivae are normal. Right eye exhibits no discharge. Left eye exhibits no discharge. No scleral icterus.  Neck: Normal range of motion.  Cardiovascular: Normal rate and regular rhythm.  Exam reveals no gallop and no friction rub.   No murmur heard. Pulmonary/Chest: Effort normal and breath sounds normal. No respiratory distress. She has no wheezes. She has no rales. She exhibits no tenderness.  Abdominal: Soft. Bowel sounds are normal. She exhibits no distension and no mass. There is tenderness (LUQ tenderness). There is no rebound and no guarding. No hernia.  Multiple prior surgical scars  Genitourinary:  Genitourinary Comments: Rectal: No gross blood, hemorrhoids, fissures, redness, area of fluctuance, lesions, or tenderness. Chaperone present during exam.   Neurological: She is alert and oriented to person, place, and time.  Skin: Skin is warm and dry.  Psychiatric: Her mood appears anxious.  Nursing note and vitals reviewed.    ED Treatments / Results  Labs (all labs ordered are listed, but only abnormal results are displayed) Labs Reviewed  COMPREHENSIVE METABOLIC PANEL - Abnormal; Notable for the following:       Result Value   Sodium 130 (*)    Chloride 96 (*)    CO2 19 (*)    Glucose, Bld 271 (*)    BUN 39 (*)    Creatinine, Ser 2.61 (*)     Calcium 10.7 (*)    Total Protein 8.7 (*)    AST 42 (*)    Alkaline Phosphatase 157 (*)    Total Bilirubin 1.3 (*)    GFR calc non Af Amer 18 (*)    GFR calc Af Amer 21 (*)    All other components within normal limits  CBC - Abnormal; Notable for the following:    WBC 17.9 (*)    All other components within normal limits  CBG MONITORING, ED - Abnormal; Notable for  the following:    Glucose-Capillary 242 (*)    All other components within normal limits  LIPASE, BLOOD  POC OCCULT BLOOD, ED    EKG  EKG Interpretation None       Radiology Ct Abdomen Pelvis Wo Contrast  Result Date: 05/08/2017 CLINICAL DATA:  Patient with left lower quadrant pain. Nausea vomiting and diarrhea. EXAM: CT ABDOMEN AND PELVIS WITHOUT CONTRAST TECHNIQUE: Multidetector CT imaging of the abdomen and pelvis was performed following the standard protocol without IV contrast. COMPARISON:  CT abdomen pelvis 12/18/2016 FINDINGS: Lower chest: Normal heart size. Lung bases are clear. No pleural effusion. Hepatobiliary: Liver is normal in size and contour. Status post cholecystectomy. Pancreas: Unremarkable Spleen: Lobular spleen. Adrenals/Urinary Tract: The adrenal glands are stable in appearance. Mild right adrenal gland thickening. Kidneys are symmetric in size. No hydronephrosis. Vascular calcifications. No nephrolithiasis. Urinary bladder is unremarkable. Stomach/Bowel: Descending colonic diverticulosis. No CT evidence for acute diverticulitis. No evidence for bowel obstruction. No free fluid or free intraperitoneal air. Normal morphology of the stomach. Vascular/Lymphatic: Normal caliber abdominal aorta. Peripheral calcified atherosclerotic plaque. No retroperitoneal lymphadenopathy. Reproductive: Unremarkable. Other: None. Musculoskeletal: Lumbar spine degenerative changes. No aggressive or acute appearing osseous lesions. IMPRESSION: No acute process within the abdomen or pelvis. Descending and sigmoid colonic  diverticulosis without evidence for acute diverticulitis. Electronically Signed   By: Lovey Newcomer M.D.   On: 05/08/2017 14:55    Procedures Procedures (including critical care time)  Medications Ordered in ED Medications  sodium chloride 0.9 % bolus 1,000 mL (0 mLs Intravenous Stopped 05/08/17 1529)  morphine 4 MG/ML injection 4 mg (4 mg Intravenous Given 05/08/17 1036)  ondansetron (ZOFRAN) injection 4 mg (4 mg Intravenous Given 05/08/17 1033)  metoCLOPramide (REGLAN) injection 10 mg (10 mg Intravenous Given 05/08/17 1124)  diphenhydrAMINE (BENADRYL) injection 12.5 mg (12.5 mg Intravenous Given 05/08/17 1124)     Initial Impression / Assessment and Plan / ED Course  I have reviewed the triage vital signs and the nursing notes.  Pertinent labs & imaging results that were available during my care of the patient were reviewed by me and considered in my medical decision making (see chart for details).  66 year old female presents with chronic abdominal pain, N/V/D. She is somewhat of a poor historian. She is hypertensive but otherwise vitals are normal. Abdominal exam is initially difficulty since she is hyperactive in the bed and turning from side to side. Will obtain abdominal labs and reassess. IVF, morphine, Zofran given. Hemoccult was also obtained.  On recheck, she feels mildly better however once I pressed on her abdomen she has become hyperactive again. CBC is remarkable for leukocytosis of 17.9. CMP is remarkable for hyponatremia (130), hypochloremia (96), CO2 of 19, hyperglycemia (271), elevated BUN (39), elevated SCr from baseline (2.61), elevated AST (42), elevated total protein (8.7), elevated bilirubin (1.3). Hemoccult is negative. She is still nauseous. Will give Reglan and Benadryl. Shared visit with Dr. Wilson Singer. Will order CT of abdomen/pelvis.  CT is unremarkable. On recheck, pt feels better and has tolerated 2 cups of ice chips. Will d/c with close f/u with PCP and rx for Reglan was  given.   Final Clinical Impressions(s) / ED Diagnoses   Final diagnoses:  Nausea vomiting and diarrhea  Left upper quadrant pain    New Prescriptions New Prescriptions   No medications on file     Iris Pert 05/09/17 9628    Virgel Manifold, MD 05/23/17 [phone]

## 2017-05-08 NOTE — ED Notes (Addendum)
Pt states " I feel like things are crawling through me or something". Pt in mid conversation will suddenly begin  thrashing around in bed and moaning and crying, then will abruptly stop those actions and continue conversation.On observation patient also appears to be having mood swings such as happy, smiling, joking then switching to frustration, sadness, crying.

## 2017-05-08 NOTE — ED Notes (Addendum)
Pt has felt urge to urinate multiple times, and attempted to void three time unsuccessfully. Completed bladder scan, reading of 150 was noted. Pt states last void was before 0700, this morning. MD notified

## 2017-05-08 NOTE — ED Notes (Signed)
Pt off floor to CT

## 2017-05-08 NOTE — ED Notes (Signed)
CBG 242

## 2017-05-08 NOTE — Discharge Instructions (Signed)
Please restart your medicines Drink plenty of fluids Take Reglan for nausea  Follow up with your doctor

## 2017-05-08 NOTE — ED Notes (Signed)
Attempted to collect UA. Pt stated she could not provide at this time.

## 2017-05-08 NOTE — ED Notes (Signed)
Pt awaiting CT.  

## 2017-11-01 ENCOUNTER — Ambulatory Visit (INDEPENDENT_AMBULATORY_CARE_PROVIDER_SITE_OTHER): Payer: Medicare HMO | Admitting: Podiatry

## 2017-11-01 DIAGNOSIS — E0843 Diabetes mellitus due to underlying condition with diabetic autonomic (poly)neuropathy: Secondary | ICD-10-CM | POA: Diagnosis not present

## 2017-11-01 DIAGNOSIS — M79676 Pain in unspecified toe(s): Secondary | ICD-10-CM | POA: Diagnosis not present

## 2017-11-01 DIAGNOSIS — B351 Tinea unguium: Secondary | ICD-10-CM

## 2017-11-01 DIAGNOSIS — S99921A Unspecified injury of right foot, initial encounter: Secondary | ICD-10-CM | POA: Diagnosis not present

## 2017-11-02 NOTE — Progress Notes (Signed)
   SUBJECTIVE 67 year old female with a history of type II diabetes mellitus presents to office today as a new patient with a chief complaint of throbbing pain to the right great toenail that began about two weeks ago secondary to stubbing it. She reports the nail is now loose. Wearing shoe increases the pain. She has been soaking the foot in Peroxide with no significant relief. She also complains of elongated, thickened nails that cause pain while ambulating in shoes. She is unable to trim her own nails. Patient is here for further evaluation and treatment.   Past Medical History:  Diagnosis Date  . Allergy    mild  . Anxiety   . Cancer (HCC)    cervical cancer  . Degenerative disc disease, lumbar   . Diabetes mellitus   . Gastroesophageal reflux   . Hyperlipidemia   . Hypertension   . Neuromuscular disorder ([place])    small hiatal hernia    OBJECTIVE General Patient is awake, alert, and oriented x 3 and in no acute distress. Derm Loosely adhered nail plate of the right great toenail. Skin is dry and supple bilateral. Negative open lesions or macerations. Remaining integument unremarkable. Nails are tender, long, thickened and dystrophic with subungual debris, consistent with onychomycosis, 1-5 bilateral. No signs of infection noted. Vasc  DP and PT pedal pulses palpable bilaterally. Temperature gradient within normal limits.  Neuro Epicritic and protective threshold sensation diminished bilaterally.  Musculoskeletal Exam No symptomatic pedal deformities noted bilateral. Muscular strength within normal limits.  ASSESSMENT 1. Diabetes Mellitus w/ peripheral neuropathy 2. Onychomycosis of nail due to dermatophyte bilateral 3. Pain in foot bilateral 4. Partially detached nail plate of the right great toe  PLAN OF CARE 1. Patient evaluated today. 2. Instructed to maintain good pedal hygiene and foot care. Stressed importance of controlling blood sugar.  3. Mechanical debridement of  nails 1-5 bilaterally performed using a nail nipper. Filed with dremel without incident.  4. Discussed treatment alternatives and plan of care. Explained nail avulsion procedure and post procedure course to patient. 5. Patient opted for total temporary nail avulsion of the right great toe.  6. Prior to procedure, local anesthesia infiltration utilized using 3 ml of a 50:50 mixture of 2% plain lidocaine and 0.5% plain marcaine in a normal hallux block fashion and a betadine prep performed.  7. Light dressing applied. 8. Return to clinic in 3 months.      Edrick Kins, DPM [facility]  Dr. Edrick Kins, [address]                                        [place], [place] [address]                Office [phone]  Fax [phone]

## 2018-01-31 ENCOUNTER — Ambulatory Visit: Payer: Medicare HMO | Admitting: Podiatry

## 2018-01-31 ENCOUNTER — Encounter (HOSPITAL_COMMUNITY): Payer: Self-pay

## 2018-01-31 ENCOUNTER — Other Ambulatory Visit: Payer: Self-pay

## 2018-01-31 ENCOUNTER — Emergency Department (HOSPITAL_COMMUNITY)
Admission: EM | Admit: 2018-01-31 | Discharge: 2018-01-31 | Disposition: A | Discharge status: Home / Self Care | Payer: Medicare HMO | Attending: Emergency Medicine | Admitting: Emergency Medicine

## 2018-01-31 DIAGNOSIS — Z794 Long term (current) use of insulin: Secondary | ICD-10-CM | POA: Diagnosis not present

## 2018-01-31 DIAGNOSIS — W19XXXA Unspecified fall, initial encounter: Secondary | ICD-10-CM | POA: Insufficient documentation

## 2018-01-31 DIAGNOSIS — M25561 Pain in right knee: Secondary | ICD-10-CM | POA: Insufficient documentation

## 2018-01-31 DIAGNOSIS — G8929 Other chronic pain: Secondary | ICD-10-CM | POA: Insufficient documentation

## 2018-01-31 DIAGNOSIS — Y929 Unspecified place or not applicable: Secondary | ICD-10-CM | POA: Insufficient documentation

## 2018-01-31 DIAGNOSIS — Y939 Activity, unspecified: Secondary | ICD-10-CM | POA: Diagnosis not present

## 2018-01-31 DIAGNOSIS — I10 Essential (primary) hypertension: Secondary | ICD-10-CM | POA: Diagnosis not present

## 2018-01-31 DIAGNOSIS — F17210 Nicotine dependence, cigarettes, uncomplicated: Secondary | ICD-10-CM | POA: Diagnosis not present

## 2018-01-31 DIAGNOSIS — M5441 Lumbago with sciatica, right side: Secondary | ICD-10-CM | POA: Insufficient documentation

## 2018-01-31 DIAGNOSIS — E1140 Type 2 diabetes mellitus with diabetic neuropathy, unspecified: Secondary | ICD-10-CM | POA: Insufficient documentation

## 2018-01-31 DIAGNOSIS — Y998 Other external cause status: Secondary | ICD-10-CM | POA: Insufficient documentation

## 2018-01-31 DIAGNOSIS — Z8541 Personal history of malignant neoplasm of cervix uteri: Secondary | ICD-10-CM | POA: Diagnosis not present

## 2018-01-31 DIAGNOSIS — Z79899 Other long term (current) drug therapy: Secondary | ICD-10-CM | POA: Insufficient documentation

## 2018-01-31 DIAGNOSIS — M545 Low back pain: Secondary | ICD-10-CM | POA: Diagnosis present

## 2018-01-31 DIAGNOSIS — M79604 Pain in right leg: Secondary | ICD-10-CM | POA: Insufficient documentation

## 2018-01-31 MED ORDER — NAPROXEN 375 MG PO TABS: 375.0000 mg | ORAL_TABLET | Freq: Two times a day (BID) | ORAL | 0 refills | Status: AC

## 2018-01-31 MED ORDER — OXYCODONE-ACETAMINOPHEN 5-325 MG PO TABS
1.0000 | ORAL_TABLET | Freq: Once | ORAL | Status: AC
  Administered 2018-01-31: 1 via ORAL
  Filled 2018-01-31: qty 1

## 2018-01-31 NOTE — ED Provider Notes (Signed)
  Face-to-face evaluation   History: She presents for evaluation of  back and leg pain, present for several months.  She had imaging, plain, done last week by her PCP which showed pre-existing chronic degenerative changes.  States she has fallen twice in the last 2 weeks and feels like she injured her right knee, in the fall.  She has been having trouble walking and feels like her right leg gives way at times.  Physical exam:, Elderly female.  She has pain in her low back more left-sided when sitting up.  The low back is diffusely tender to light touch.  Right knee is somewhat swelling and there is crepitation with active range of motion.  No evident right knee joint effusion.  No pain or tenderness of the right hip or ankle.  Medical screening examination/treatment/procedure(s) were conducted as a shared visit with non-physician practitioner(s) and myself.  I personally evaluated the patient during the encounter    Daleen Bo, MD 02/08/18 1212

## 2018-01-31 NOTE — ED Notes (Signed)
Ortho tech paged - pt to be d/c'd after knee sleeve applied - pt aware of same

## 2018-01-31 NOTE — Progress Notes (Signed)
Orthopedic Tech Progress Note Patient Details:  Nichole Franklin 06-12-1951 [id number]  Ortho Devices Type of Ortho Device: Knee Sleeve Ortho Device/Splint Location: RLE Ortho Device/Splint Interventions: Ordered, Application   Post Interventions Patient Tolerated: Well Instructions Provided: Care of device   Braulio Bosch 01/31/2018, 5:22 PM

## 2018-01-31 NOTE — ED Notes (Signed)
Pt reports rgt lower back pain rad into RLE  "for years"; states has seen PCP "multiple times" for same - reportedly had back films done last week - pt unsure of results; states pain has became increasingly worse - now resulting in falls 2/2 weakness to RLE

## 2018-01-31 NOTE — ED Triage Notes (Signed)
Pt presents for evaluation of R leg pain x 2-3 months. Pt reports leg is "giving out" and causing falls. Reports some back pain as well.

## 2018-01-31 NOTE — ED Provider Notes (Signed)
[facility] EMERGENCY DEPARTMENT Provider Note   CSN: [id number] Arrival date & time: 01/31/18  1115     History   Chief Complaint Chief Complaint  Patient presents with  . Fall    HPI Nichole Franklin is a 67 y.o. female.  HPI  Patient is a 39-year female with a history of cervical cancer, type 2 diabetes mellitus, hyperlipidemia, hypertension, and degenerative disc disease lumbar spine presenting for low back pain, diffuse right leg pain, and right knee pain.  Patient reports that she has had difficulty with her low back for "years", however her right lower extremity has become acutely more painful over the past 2 months.  Patient notes that the pain is down the entire leg, and a "burning sensation", however the knee has been her more acute issue.  Patient reports that her right leg will give out due to pain.  Patient reports she has been previous evaluated by her primary care provider including MRI in 2018 and x-ray of the lumbar spine just last week, but she is having difficulty getting into further treatment and orthopedic referral.  Patient takes a home regimen of hydrocodone-acetaminophen, and gabapentin 3 times daily for diabetic neuropathy.  Patient denies any bilateral lower extremity numbness, saddle anesthesia, loss of bowel or bladder control, fever, chills.  Patient's prior history of cervical cancers treated.  Past Medical History:  Diagnosis Date  . Allergy    mild  . Anxiety   . Cancer (HCC)    cervical cancer  . Degenerative disc disease, lumbar   . Diabetes mellitus   . Gastroesophageal reflux   . Hyperlipidemia   . Hypertension   . Neuromuscular disorder ([place])    small hiatal hernia    Patient Active Problem List   Diagnosis Date Noted  . HTN (hypertension) 12/14/2016  . Diabetes ([place]) 12/14/2016  . Gastroesophageal reflux     Past Surgical History:  Procedure Laterality Date  . ABDOMINAL HYSTERECTOMY    . CHOLECYSTECTOMY       OB  History   None      Home Medications    Prior to Admission medications   Medication Sig Start Date End Date Taking? Authorizing Provider  DULoxetine (CYMBALTA) 30 MG capsule Take 30 mg by mouth daily.    [provider]  enalapril (VASOTEC) 20 MG tablet Take 20 mg by mouth 2 (two) times daily.    [provider]  gabapentin (NEURONTIN) 300 MG capsule Take 300 mg by mouth 2 (two) times daily.     [provider]  hydrochlorothiazide (HYDRODIURIL) 25 MG tablet Take 25 mg by mouth daily.    [provider]  metFORMIN (GLUCOPHAGE) 500 MG tablet Take 500 mg by mouth 2 (two) times daily.    [provider]  metoCLOPramide (REGLAN) 10 MG tablet Take 1 tablet (10 mg total) by mouth every 6 (six) hours. 05/08/17   Recardo Evangelist, PA-C  naproxen (NAPROSYN) 375 MG tablet Take 1 tablet (375 mg total) by mouth 2 (two) times daily. 01/31/18   Langston Masker B, PA-C  naproxen sodium (ANAPROX) 220 MG tablet Take 440 mg by mouth 2 (two) times daily as needed (pain).    [provider]  NOVOLIN 70/30 RELION (70-30) 100 UNIT/ML injection Inject 30 Units into the skin 2 (two) times daily with a meal.  11/21/16   [provider]  omeprazole (PRILOSEC) 20 MG capsule Take 1 capsule (20 mg total) by mouth 2 (two) times daily  before a meal. 03/15/17   Ladene Artist, MD  pravastatin (PRAVACHOL) 80 MG tablet Take 80 mg by mouth at bedtime.    [provider]  [place] .3CC/29G 29G X 1/2" 0.3 ML MISC  11/16/16   [provider]  traMADol (ULTRAM) 50 MG tablet Take 50 mg by mouth 2 (two) times daily.    [provider]    Family History Family History  Problem Relation Age of Onset  . Breast cancer Sister   . Diabetes Sister        x 3  . Diabetes Brother   . Colon cancer Neg Hx   . Esophageal cancer Neg Hx   . Pancreatic cancer Neg Hx   . Stomach cancer Neg Hx   . Colon polyps Neg Hx   . Rectal cancer Neg Hx      Social History Social History   Tobacco Use  . Smoking status: Current Every Day Smoker    Packs/day: 0.25    Types: Cigarettes  . Smokeless tobacco: Never Used  . Tobacco comment: form given 12/14/16 -3-6 cigs a day   Substance Use Topics  . Alcohol use: No  . Drug use: No     Allergies   Patient has no known allergies.   Review of Systems Review of Systems  Constitutional: Negative for chills and fever.  Respiratory: Negative for chest tightness and shortness of breath.   Cardiovascular: Negative for chest pain.  Gastrointestinal: Negative for abdominal pain, nausea and vomiting.  Musculoskeletal: Positive for arthralgias, back pain, gait problem and myalgias.  Skin: Negative for wound.  Neurological: Positive for weakness. Negative for syncope and numbness.  All other systems reviewed and are negative.    Physical Exam Updated Vital Signs BP 108/72 (BP Location: Right Arm)   Pulse (!) 106   Temp 98.4 F (36.9 C) (Oral)   Resp 18   SpO2 100%   Physical Exam  Constitutional: She appears well-developed and well-nourished.  HENT:  Head: Normocephalic and atraumatic.  Mouth/Throat: Oropharynx is clear and moist.  Eyes: Pupils are equal, round, and reactive to light. Conjunctivae and EOM are normal.  Neck: Normal range of motion. Neck supple.  Cardiovascular: Normal rate, regular rhythm, S1 normal and S2 normal.  No murmur heard. Pulmonary/Chest: Effort normal and breath sounds normal. She has no wheezes. She has no rales.  Abdominal: Soft. She exhibits no distension. There is no tenderness. There is no guarding.  Musculoskeletal:  Spine Exam: Inspection/Palpation: Patient has diffuse paraspinal muscular tenderness in the lumbar region in a bandlike pattern.  No midline tenderness of cervical, thoracic, or lumbar spine. Strength: 4+/5 throughout RLE (hip flexion/extension, adduction/abduction; knee flexion/extension; foot dorsiflexion/plantarflexion,  inversion/eversion; great toe inversion); strength 5/5 in LLE.  Patient reporting pain reproducible while performing strength of the left lower extremity. Sensation: Intact to light touch in proximal and distal LE bilaterally Reflexes: Reflexes 1+ in the quadriceps and Achilles and right lower extremity, and 2+ in the left lower extremity.  Right knee with some clicking and popping to varus exam.  Patient is able to perform active and passive flexion extension of the knee.  There is no erythema, or focal effusion noted.  Lymphadenopathy:    She has no cervical adenopathy.  Neurological: She is alert.  Patient ambulates with cane and exhibits antalgic gait.  Skin: Skin is warm and dry. No rash noted. No erythema.  Psychiatric: She has a normal mood and affect. Her behavior is normal.  Judgment and thought content normal.  Nursing note and vitals reviewed.    ED Treatments / Results  Labs (all labs ordered are listed, but only abnormal results are displayed) Labs Reviewed - No data to display  EKG None  Radiology No results found.  Procedures Procedures (including critical care time)  Medications Ordered in ED Medications  oxyCODONE-acetaminophen (PERCOCET/ROXICET) 5-325 MG per tablet 1 tablet (1 tablet Oral Given 01/31/18 1637)     Initial Impression / Assessment and Plan / ED Course  I have reviewed the triage vital signs and the nursing notes.  Pertinent labs & imaging results that were available during my care of the patient were reviewed by me and considered in my medical decision making (see chart for details).  Clinical Course as of Jan 31 1646  Mon Jan 31, 2018  1643 Patient stated to me that she does not wish to have face-to-face after initial discussion about it.  Patient refers to continue to pursue outpatient physical therapy through her PCP.   [AM]    Clinical Course User Index [AM] Albesa Seen, PA-C    Patient nontoxic-appearing, afebrile, but  uncomfortable in the lower extremities.  Patient describing acute on chronic pain.  Do not suspect epidural abscess, ruptured disc, or other causes of cauda equina.  Patient's knee examination is not concerning for septic arthritis.  Suspect that patient's conditions are due to acute on chronic worsening of arthritic disease.  Discussed this with patient.  Discussed placing patient on a 5-day course of naproxen.  Will defer steroids due to diabetes mellitus.  Patient instructed to follow-up with her primary care provider regarding physical therapy orders.  Patient declined face-to-face at this time, as she reports she does not want any therapist coming into her home.  Orthopedic referral placed to patient to discuss further treatments or injections for the right knee.  Patient is already on chronic narcotic medications for pain so will defer any further Rx at this time.  Patient instructed to return for any acute worsening neurologic symptoms in the lower extremities, or erythema or swelling in bilateral lower extremities or joints.  Patient is in understanding and agrees with plan of care.  Of note, patient had transient tachycardic reading in the emergency department.  Pulse was rechecked by me, and found to be in the 90s.  This is a shared visit with Dr. Daleen Bo. Patient was independently evaluated by this attending physician. Attending physician consulted in evaluation and discharge management.  Final Clinical Impressions(s) / ED Diagnoses   Final diagnoses:  Chronic right-sided low back pain with right-sided sciatica  Chronic pain of right knee    ED Discharge Orders        Ordered    naproxen (NAPROSYN) 375 MG tablet  2 times daily     01/31/18 [address] 01/31/18 Kathyrn Drown    Daleen Bo, MD 02/08/18 1212

## 2018-01-31 NOTE — ED Notes (Signed)
Knee sleeve applied per ortho

## 2018-01-31 NOTE — Discharge Instructions (Signed)
Please see the information and instructions below regarding your visit.  Your diagnoses today include:  1. Chronic right-sided low back pain with right-sided sciatica   2. Chronic pain of right knee    About diagnosis. Most episodes of acute low back pain are self-limited. Your exam was reassuring today that the source of your pain is not affecting the spinal cord and nerves that originate in the spinal cord.   If you have a history of disc herniation or arthritis in your spine, the nerves exiting the spine on one side get inflamed. This can cause severe pain. We call this radiculopathy. We do not always know what causes the sudden inflammation.  Tests performed today include: See side panel of your discharge paperwork for testing performed today. Vital signs are listed at the bottom of these instructions.   Medications prescribed:    Take any prescribed medications only as prescribed, and any over the counter medications only as directed on the packaging. You are prescribed naproxen, a non-steroidal anti-inflammatory agent (NSAID) for pain. You may take 375 mg every 12 hours as needed for pain. If still requiring this medication around the clock for acute pain after 10 days, please see your primary healthcare provider.  Women who are pregnant, breastfeeding, or planning on becoming pregnant should not take non-steroidal anti-inflammatories such as Advil and Aleve. Tylenol is a safe over the counter pain reliever in pregnant women.  Continue your home pain regimen.  This is not a long-term medication unless under the care and direction of your primary provider. Taking this medication long-term and not under the supervision of a healthcare provider could increase the risk of stomach ulcers, kidney problems, and cardiovascular problems such as high blood pressure.   Home care instructions:   Low back pain gets worse the longer you stay stationary. Please keep moving and walking as tolerated.  There are exercises included in this packet to perform as tolerated for your low back pain.   Apply heat to the areas that are painful. Avoid twisting or bending your trunk to lift something. Do not lift anything above 25 lbs while recovering from this flare of low back pain.  Please follow any educational materials contained in this packet.   Follow-up instructions: Please follow-up with your primary care provider in one week for further evaluation of your symptoms if they are not completely improved.   Please follow up with Dr. Mardelle Matte of [place] regarding your back and knee pain.  Return instructions:  Please return to the Emergency Department if you experience worsening symptoms.  Please return for any fever or chills in the setting of your back pain, weakness in the muscles of the legs, numbness in your legs and feet that is new or changing, numbness in the area where you wipe, retention of your urine, loss of bowel or bladder control, or problems with walking. Please return if you have any other emergent concerns.  Additional Information:   Your vital signs today were: BP 108/72 (BP Location: Right Arm)    Pulse (!) 106    Temp 98.4 F (36.9 C) (Oral)    Resp 18    SpO2 100%  If your blood pressure (BP) was elevated on multiple readings during this visit above 130 for the top number or above 80 for the bottom number, please have this repeated by your primary care provider within one month. --------------  Thank you for allowing [place] to participate in your care today.

## 2018-01-31 NOTE — Progress Notes (Signed)
Pt initially agreed to [facility], but then decided against it. Pt does not want home health.   Wendelyn Breslow, [facility]  [phone]

## 2018-02-14 ENCOUNTER — Encounter: Payer: Medicare HMO | Admitting: Podiatry

## 2018-02-16 ENCOUNTER — Other Ambulatory Visit: Payer: Self-pay | Admitting: Orthopedic Surgery

## 2018-02-16 DIAGNOSIS — M48061 Spinal stenosis, lumbar region without neurogenic claudication: Secondary | ICD-10-CM

## 2018-02-21 NOTE — Progress Notes (Signed)
This encounter was created in error - please disregard.

## 2018-03-22 ENCOUNTER — Ambulatory Visit
Admission: RE | Admit: 2018-03-22 | Discharge: 2018-03-22 | Disposition: A | Discharge status: Home / Self Care | Payer: Medicare HMO | Source: Ambulatory Visit | Attending: Orthopedic Surgery | Admitting: Orthopedic Surgery

## 2018-03-22 DIAGNOSIS — M48061 Spinal stenosis, lumbar region without neurogenic claudication: Secondary | ICD-10-CM

## 2018-10-19 IMAGING — MR MR LUMBAR SPINE W/O CM
4 of 5 series · 26 of 48 positions shown · non-contrast
Comparison: Lumbar MRI 01/19/2017

CLINICAL DATA: Lumbar spinal stenosis with bilateral leg pain

EXAM:
MRI LUMBAR SPINE WITHOUT CONTRAST
TECHNIQUE: Multiplanar, multisequence MR imaging of the lumbar spine was
performed. No intravenous contrast was administered.

[Series 3: T2 · sagittal · 4.0mm · 0.55mm/px · 6 of 13 slices shown (1 of 2)]
[im 1/13]
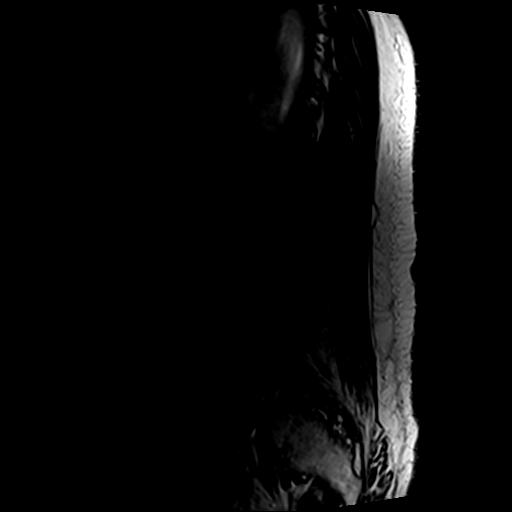
[im 3/13]
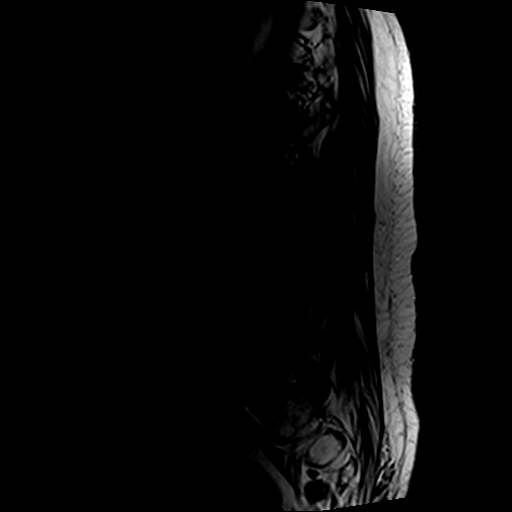
[im 5/13]
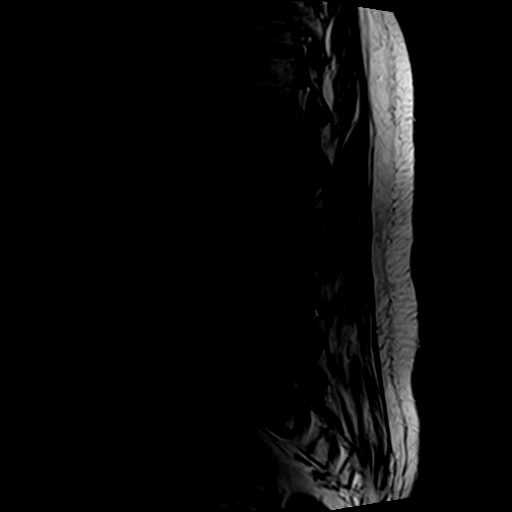
[im 8/13]
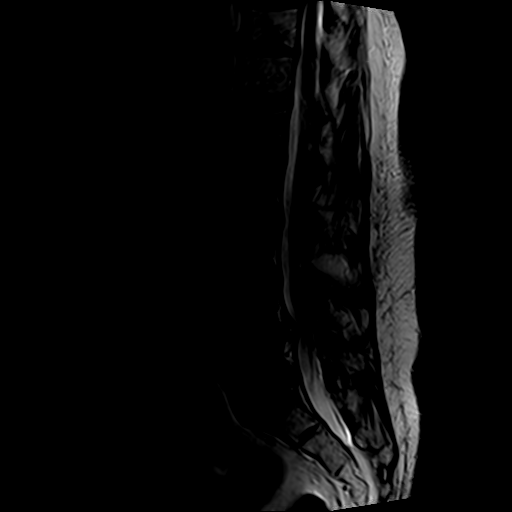
[im 10/13]
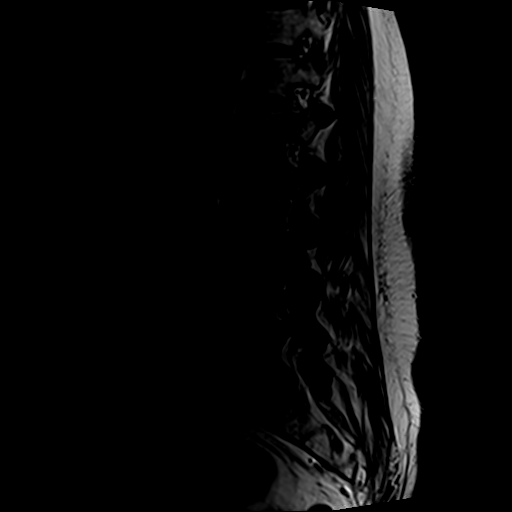
[im 13/13]
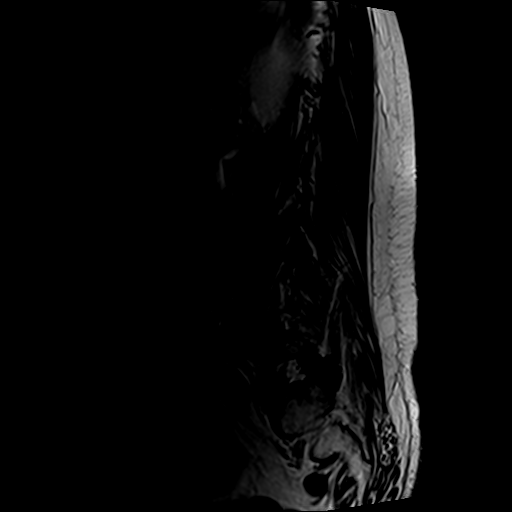

[Series 5: T1 · sagittal · 4.0mm · 0.55mm/px · 6 of 13 slices shown (1 of 2)]
[im 1/13]
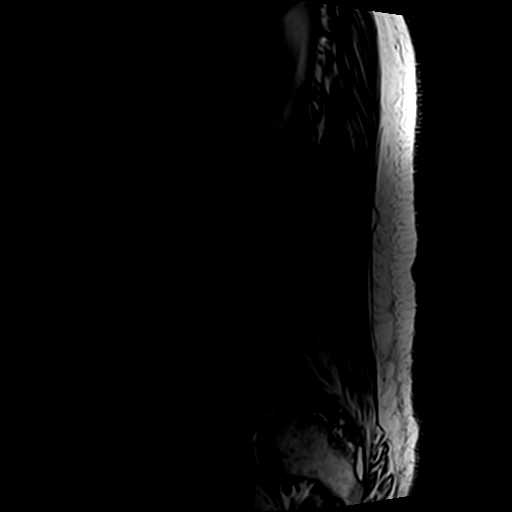
[im 3/13]
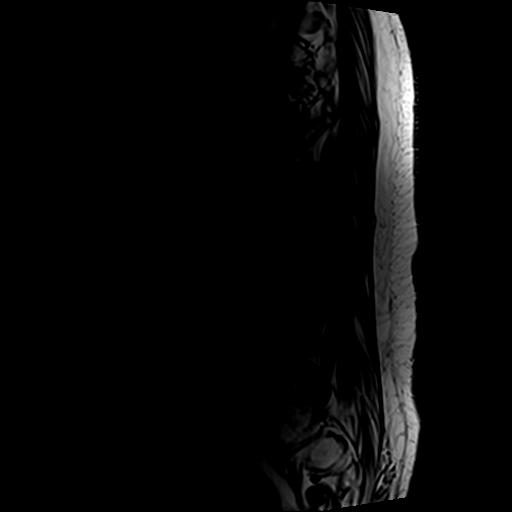
[im 5/13]
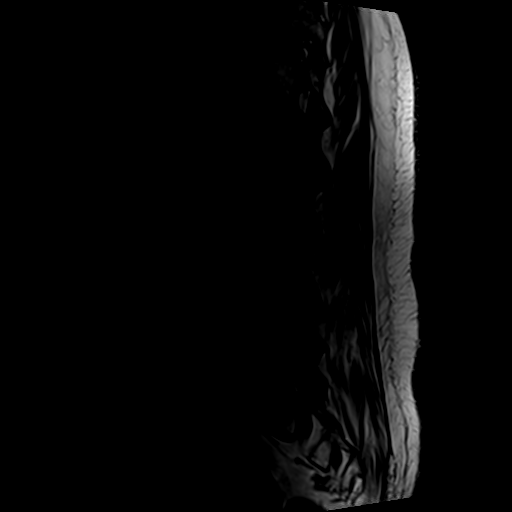
[im 8/13]
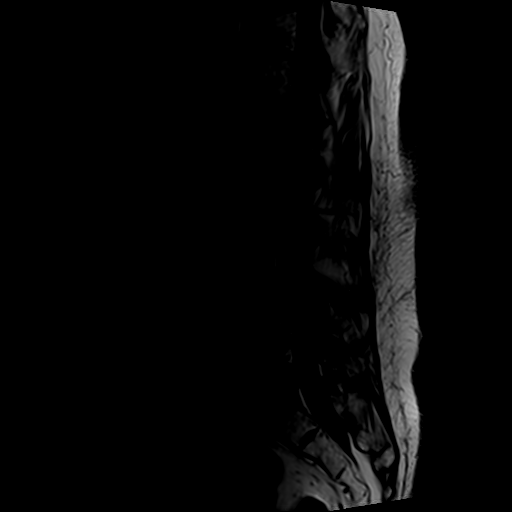
[im 10/13]
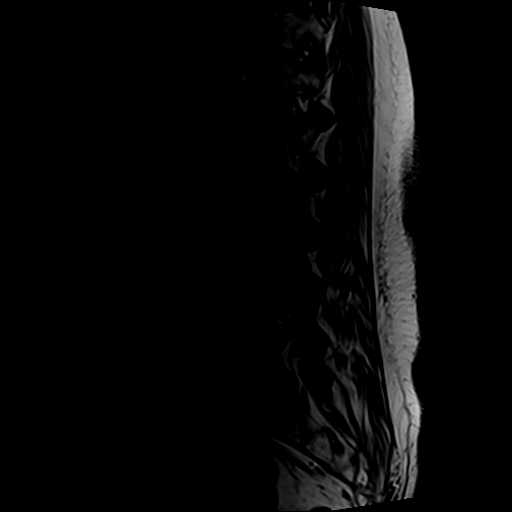
[im 13/13]
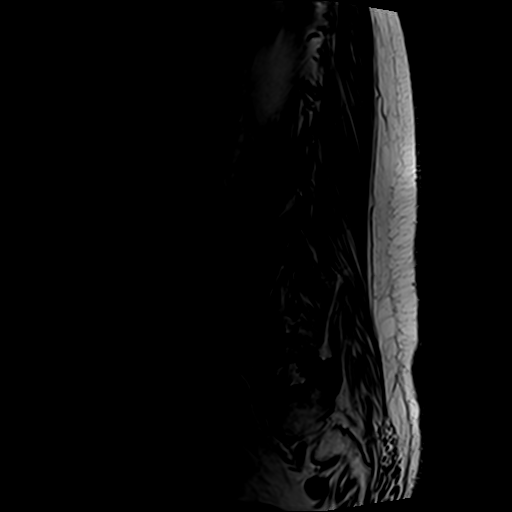

[Series 6: T1 · axial · 4.0mm · 0.35mm/px · z∈[-170,+3]mm · 5 of 34 slices shown (2 of 2)]
[im 1/34]
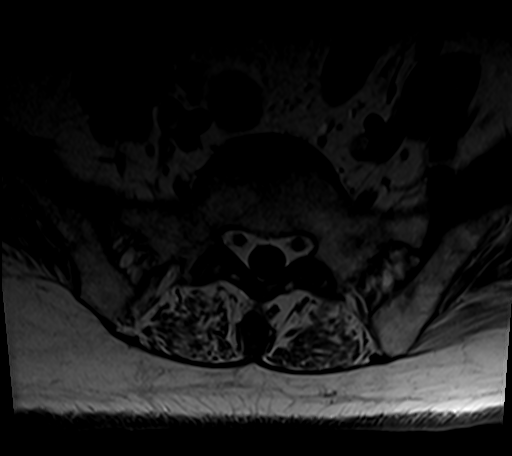
[im 5/34]
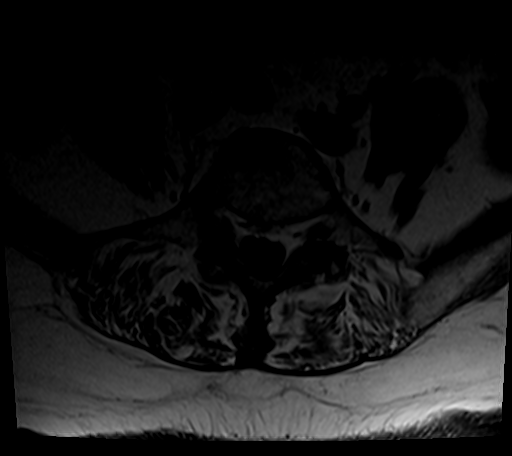
[im 10/34]
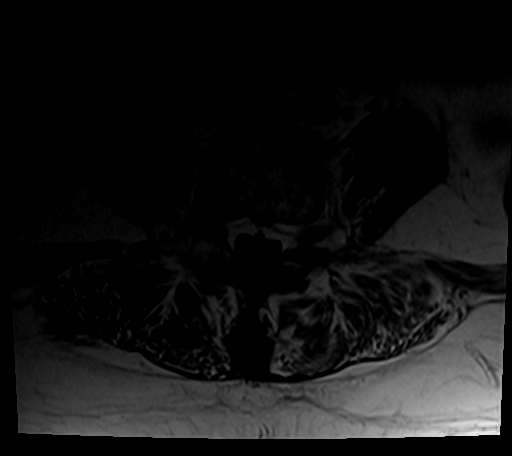
[im 17/34]
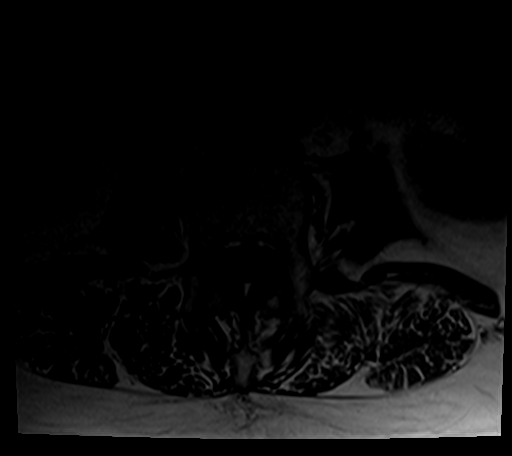
[im 29/34]
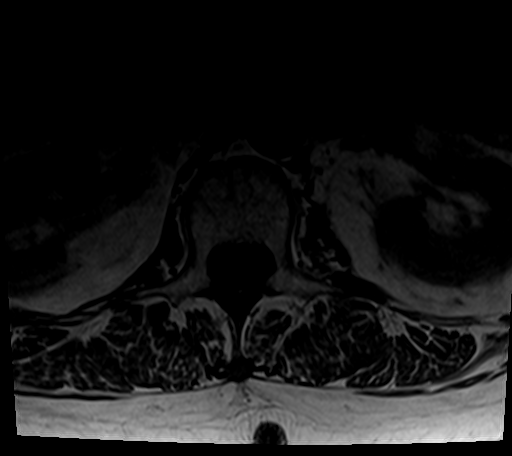

[Series 7: T2 · axial · 4.0mm · 0.70mm/px · z∈[-170,+28]mm · 9 of 34 slices shown (2 of 2)]
[im 1/34]
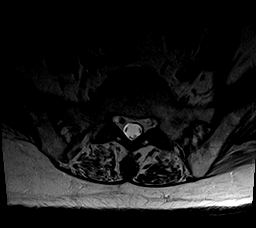
[im 5/34]
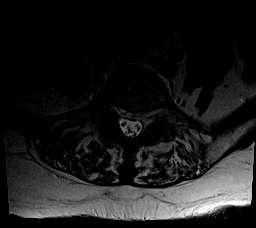
[im 10/34]
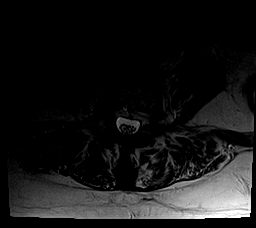
[im 15/34]
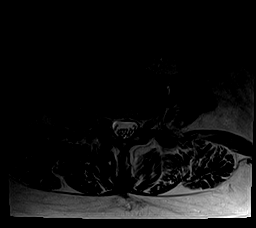
[im 17/34]
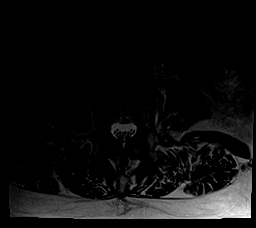
[im 19/34]
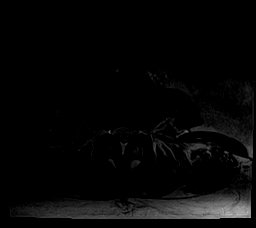
[im 24/34]
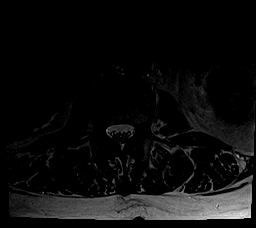
[im 29/34]
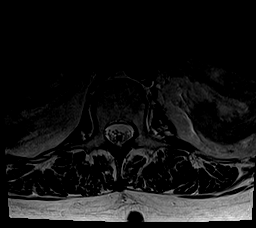
[im 34/34]
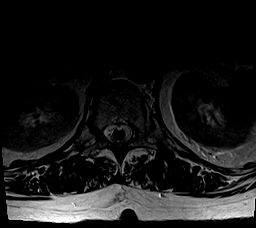

[26 of 48 positions shown; findings below may reference images not displayed]

FINDINGS: Segmentation:  Normal

Alignment:  Normal

Vertebrae: Negative for fracture or mass. Normal bone marrow.
Schmorl's node in the inferior endplate of T11 has developed since
the prior study.

Conus medullaris and cauda equina: Conus extends to the L1-2 level.
Conus and cauda equina appear normal.

Paraspinal and other soft tissues: Negative for paraspinous mass or
edema.

Disc levels:

T12-L1: Mild facet degeneration.  Negative for stenosis

L1-2: Normal disc with mild facet degeneration

L2-3: Mild disc and facet degeneration without significant stenosis

L3-4: Mild bulging of the disc and moderate facet hypertrophy. Mild
spinal stenosis.

L4-5: Advanced disc degeneration with marked disc space narrowing.
Prominent endplate osteophyte formation left greater than right.
Moderate to severe spinal stenosis unchanged. Severe subarticular
stenosis left greater than right unchanged.

L5-S1: Mild disc bulging and mild facet degeneration. Mild
subarticular stenosis on the right.
IMPRESSION: Mild spinal stenosis L3-4 unchanged

Advanced disc degeneration and spurring L4-5 left greater than
right. Moderate to severe spinal stenosis unchanged. Severe
subarticular stenosis left greater than right unchanged.

## 2019-02-26 ENCOUNTER — Emergency Department: Payer: Medicare HMO

## 2019-02-26 ENCOUNTER — Other Ambulatory Visit: Payer: Self-pay

## 2019-02-26 ENCOUNTER — Emergency Department
Admission: EM | Admit: 2019-02-26 | Discharge: 2019-02-26 | Disposition: A | Discharge status: Other Acute Inpatient Hospital | Payer: Medicare HMO | Attending: Emergency Medicine | Admitting: Emergency Medicine

## 2019-02-26 ENCOUNTER — Inpatient Hospital Stay (HOSPITAL_COMMUNITY)
Admission: AD | Admit: 2019-02-26 | Discharge: 2019-04-01 | DRG: 207 | Disposition: E | Payer: Medicare HMO | Source: Other Acute Inpatient Hospital | Attending: Internal Medicine | Admitting: Internal Medicine

## 2019-02-26 ENCOUNTER — Encounter: Payer: Self-pay | Admitting: Emergency Medicine

## 2019-02-26 DIAGNOSIS — G9341 Metabolic encephalopathy: Secondary | ICD-10-CM

## 2019-02-26 DIAGNOSIS — N179 Acute kidney failure, unspecified: Secondary | ICD-10-CM | POA: Insufficient documentation

## 2019-02-26 DIAGNOSIS — Z794 Long term (current) use of insulin: Secondary | ICD-10-CM | POA: Insufficient documentation

## 2019-02-26 DIAGNOSIS — F17210 Nicotine dependence, cigarettes, uncomplicated: Secondary | ICD-10-CM | POA: Diagnosis present

## 2019-02-26 DIAGNOSIS — K219 Gastro-esophageal reflux disease without esophagitis: Secondary | ICD-10-CM | POA: Diagnosis present

## 2019-02-26 DIAGNOSIS — E119 Type 2 diabetes mellitus without complications: Secondary | ICD-10-CM | POA: Insufficient documentation

## 2019-02-26 DIAGNOSIS — I160 Hypertensive urgency: Secondary | ICD-10-CM | POA: Diagnosis not present

## 2019-02-26 DIAGNOSIS — D649 Anemia, unspecified: Secondary | ICD-10-CM | POA: Diagnosis not present

## 2019-02-26 DIAGNOSIS — Z79891 Long term (current) use of opiate analgesic: Secondary | ICD-10-CM

## 2019-02-26 DIAGNOSIS — Y92230 Patient room in hospital as the place of occurrence of the external cause: Secondary | ICD-10-CM | POA: Diagnosis not present

## 2019-02-26 DIAGNOSIS — R001 Bradycardia, unspecified: Secondary | ICD-10-CM | POA: Diagnosis not present

## 2019-02-26 DIAGNOSIS — Z79899 Other long term (current) drug therapy: Secondary | ICD-10-CM

## 2019-02-26 DIAGNOSIS — Z66 Do not resuscitate: Secondary | ICD-10-CM | POA: Diagnosis not present

## 2019-02-26 DIAGNOSIS — D631 Anemia in chronic kidney disease: Secondary | ICD-10-CM | POA: Diagnosis present

## 2019-02-26 DIAGNOSIS — J1289 Other viral pneumonia: Secondary | ICD-10-CM | POA: Diagnosis not present

## 2019-02-26 DIAGNOSIS — E871 Hypo-osmolality and hyponatremia: Secondary | ICD-10-CM | POA: Diagnosis not present

## 2019-02-26 DIAGNOSIS — F05 Delirium due to known physiological condition: Secondary | ICD-10-CM | POA: Diagnosis not present

## 2019-02-26 DIAGNOSIS — E860 Dehydration: Secondary | ICD-10-CM | POA: Diagnosis not present

## 2019-02-26 DIAGNOSIS — R0602 Shortness of breath: Secondary | ICD-10-CM | POA: Diagnosis present

## 2019-02-26 DIAGNOSIS — Z0189 Encounter for other specified special examinations: Secondary | ICD-10-CM

## 2019-02-26 DIAGNOSIS — E870 Hyperosmolality and hypernatremia: Secondary | ICD-10-CM | POA: Diagnosis not present

## 2019-02-26 DIAGNOSIS — I4581 Long QT syndrome: Secondary | ICD-10-CM | POA: Diagnosis not present

## 2019-02-26 DIAGNOSIS — E669 Obesity, unspecified: Secondary | ICD-10-CM | POA: Diagnosis present

## 2019-02-26 DIAGNOSIS — R578 Other shock: Secondary | ICD-10-CM | POA: Diagnosis not present

## 2019-02-26 DIAGNOSIS — F419 Anxiety disorder, unspecified: Secondary | ICD-10-CM | POA: Diagnosis present

## 2019-02-26 DIAGNOSIS — Z781 Physical restraint status: Secondary | ICD-10-CM

## 2019-02-26 DIAGNOSIS — Z789 Other specified health status: Secondary | ICD-10-CM

## 2019-02-26 DIAGNOSIS — E861 Hypovolemia: Secondary | ICD-10-CM | POA: Diagnosis not present

## 2019-02-26 DIAGNOSIS — R579 Shock, unspecified: Secondary | ICD-10-CM | POA: Diagnosis not present

## 2019-02-26 DIAGNOSIS — Z8541 Personal history of malignant neoplasm of cervix uteri: Secondary | ICD-10-CM | POA: Diagnosis not present

## 2019-02-26 DIAGNOSIS — A419 Sepsis, unspecified organism: Secondary | ICD-10-CM | POA: Diagnosis not present

## 2019-02-26 DIAGNOSIS — R6521 Severe sepsis with septic shock: Secondary | ICD-10-CM | POA: Diagnosis not present

## 2019-02-26 DIAGNOSIS — E1165 Type 2 diabetes mellitus with hyperglycemia: Secondary | ICD-10-CM | POA: Diagnosis present

## 2019-02-26 DIAGNOSIS — E872 Acidosis: Secondary | ICD-10-CM | POA: Diagnosis not present

## 2019-02-26 DIAGNOSIS — L8990 Pressure ulcer of unspecified site, unspecified stage: Secondary | ICD-10-CM | POA: Insufficient documentation

## 2019-02-26 DIAGNOSIS — I10 Essential (primary) hypertension: Secondary | ICD-10-CM | POA: Diagnosis not present

## 2019-02-26 DIAGNOSIS — R609 Edema, unspecified: Secondary | ICD-10-CM | POA: Diagnosis not present

## 2019-02-26 DIAGNOSIS — E1122 Type 2 diabetes mellitus with diabetic chronic kidney disease: Secondary | ICD-10-CM | POA: Diagnosis present

## 2019-02-26 DIAGNOSIS — E8729 Other acidosis: Secondary | ICD-10-CM

## 2019-02-26 DIAGNOSIS — J9690 Respiratory failure, unspecified, unspecified whether with hypoxia or hypercapnia: Secondary | ICD-10-CM

## 2019-02-26 DIAGNOSIS — I129 Hypertensive chronic kidney disease with stage 1 through stage 4 chronic kidney disease, or unspecified chronic kidney disease: Secondary | ICD-10-CM | POA: Diagnosis present

## 2019-02-26 DIAGNOSIS — E876 Hypokalemia: Secondary | ICD-10-CM | POA: Diagnosis not present

## 2019-02-26 DIAGNOSIS — N184 Chronic kidney disease, stage 4 (severe): Secondary | ICD-10-CM | POA: Diagnosis present

## 2019-02-26 DIAGNOSIS — R0689 Other abnormalities of breathing: Secondary | ICD-10-CM

## 2019-02-26 DIAGNOSIS — R109 Unspecified abdominal pain: Secondary | ICD-10-CM

## 2019-02-26 DIAGNOSIS — Z515 Encounter for palliative care: Secondary | ICD-10-CM | POA: Diagnosis not present

## 2019-02-26 DIAGNOSIS — R0600 Dyspnea, unspecified: Secondary | ICD-10-CM

## 2019-02-26 DIAGNOSIS — E1129 Type 2 diabetes mellitus with other diabetic kidney complication: Secondary | ICD-10-CM | POA: Diagnosis present

## 2019-02-26 DIAGNOSIS — R339 Retention of urine, unspecified: Secondary | ICD-10-CM | POA: Diagnosis present

## 2019-02-26 DIAGNOSIS — E8770 Fluid overload, unspecified: Secondary | ICD-10-CM | POA: Diagnosis not present

## 2019-02-26 DIAGNOSIS — E785 Hyperlipidemia, unspecified: Secondary | ICD-10-CM | POA: Diagnosis present

## 2019-02-26 DIAGNOSIS — R509 Fever, unspecified: Secondary | ICD-10-CM | POA: Diagnosis not present

## 2019-02-26 DIAGNOSIS — T375X5A Adverse effect of antiviral drugs, initial encounter: Secondary | ICD-10-CM | POA: Diagnosis not present

## 2019-02-26 DIAGNOSIS — U071 COVID-19: Secondary | ICD-10-CM | POA: Diagnosis not present

## 2019-02-26 DIAGNOSIS — M6281 Muscle weakness (generalized): Secondary | ICD-10-CM | POA: Diagnosis not present

## 2019-02-26 DIAGNOSIS — Z978 Presence of other specified devices: Secondary | ICD-10-CM

## 2019-02-26 DIAGNOSIS — Z452 Encounter for adjustment and management of vascular access device: Secondary | ICD-10-CM

## 2019-02-26 DIAGNOSIS — D696 Thrombocytopenia, unspecified: Secondary | ICD-10-CM | POA: Diagnosis not present

## 2019-02-26 DIAGNOSIS — A4189 Other specified sepsis: Secondary | ICD-10-CM | POA: Diagnosis not present

## 2019-02-26 DIAGNOSIS — J9601 Acute respiratory failure with hypoxia: Secondary | ICD-10-CM | POA: Diagnosis not present

## 2019-02-26 DIAGNOSIS — R0902 Hypoxemia: Secondary | ICD-10-CM

## 2019-02-26 DIAGNOSIS — M5136 Other intervertebral disc degeneration, lumbar region: Secondary | ICD-10-CM | POA: Diagnosis present

## 2019-02-26 DIAGNOSIS — T783XXA Angioneurotic edema, initial encounter: Secondary | ICD-10-CM | POA: Diagnosis not present

## 2019-02-26 DIAGNOSIS — M7989 Other specified soft tissue disorders: Secondary | ICD-10-CM | POA: Diagnosis not present

## 2019-02-26 DIAGNOSIS — G4700 Insomnia, unspecified: Secondary | ICD-10-CM | POA: Diagnosis not present

## 2019-02-26 DIAGNOSIS — J1282 Pneumonia due to coronavirus disease 2019: Secondary | ICD-10-CM | POA: Diagnosis present

## 2019-02-26 DIAGNOSIS — J80 Acute respiratory distress syndrome: Secondary | ICD-10-CM | POA: Diagnosis not present

## 2019-02-26 DIAGNOSIS — Z791 Long term (current) use of non-steroidal anti-inflammatories (NSAID): Secondary | ICD-10-CM

## 2019-02-26 DIAGNOSIS — Z7189 Other specified counseling: Secondary | ICD-10-CM | POA: Diagnosis not present

## 2019-02-26 LAB — CBC WITH DIFFERENTIAL/PLATELET
Abs Immature Granulocytes: 0.51 10*3/uL — ABNORMAL HIGH (ref 0.00–0.07)
Basophils Absolute: 0 10*3/uL (ref 0.0–0.1)
Basophils Relative: 0 %
Eosinophils Absolute: 0 10*3/uL (ref 0.0–0.5)
Eosinophils Relative: 0 %
HCT: 28.8 % — ABNORMAL LOW (ref 36.0–46.0)
Hemoglobin: 9 g/dL — ABNORMAL LOW (ref 12.0–15.0)
Immature Granulocytes: 3 %
Lymphocytes Relative: 12 %
Lymphs Abs: 2.2 10*3/uL (ref 0.7–4.0)
MCH: 26.9 pg (ref 26.0–34.0)
MCHC: 31.3 g/dL (ref 30.0–36.0)
MCV: 86.2 fL (ref 80.0–100.0)
Monocytes Absolute: 1.1 10*3/uL — ABNORMAL HIGH (ref 0.1–1.0)
Monocytes Relative: 6 %
Neutro Abs: 15.1 10*3/uL — ABNORMAL HIGH (ref 1.7–7.7)
Neutrophils Relative %: 79 %
Platelets: 289 10*3/uL (ref 150–400)
RBC: 3.34 MIL/uL — ABNORMAL LOW (ref 3.87–5.11)
RDW: 16.5 % — ABNORMAL HIGH (ref 11.5–15.5)
WBC: 19 10*3/uL — ABNORMAL HIGH (ref 4.0–10.5)
nRBC: 0.2 % (ref 0.0–0.2)

## 2019-02-26 LAB — PROCALCITONIN: Procalcitonin: 4.54 ng/mL

## 2019-02-26 LAB — COMPREHENSIVE METABOLIC PANEL
ALT: 20 U/L (ref 0–44)
AST: 54 U/L — ABNORMAL HIGH (ref 15–41)
Albumin: 3 g/dL — ABNORMAL LOW (ref 3.5–5.0)
Alkaline Phosphatase: 178 U/L — ABNORMAL HIGH (ref 38–126)
Anion gap: 21 — ABNORMAL HIGH (ref 5–15)
BUN: 81 mg/dL — ABNORMAL HIGH (ref 8–23)
CO2: 13 mmol/L — ABNORMAL LOW (ref 22–32)
Calcium: 7.7 mg/dL — ABNORMAL LOW (ref 8.9–10.3)
Chloride: 104 mmol/L (ref 98–111)
Creatinine, Ser: 7.09 mg/dL — ABNORMAL HIGH (ref 0.44–1.00)
GFR calc Af Amer: 6 mL/min — ABNORMAL LOW (ref >60)
GFR calc non Af Amer: 5 mL/min — ABNORMAL LOW (ref >60)
Glucose, Bld: 144 mg/dL — ABNORMAL HIGH (ref 70–99)
Potassium: 4.7 mmol/L (ref 3.5–5.1)
Sodium: 138 mmol/L (ref 135–145)
Total Bilirubin: 1.1 mg/dL (ref 0.3–1.2)
Total Protein: 8 g/dL (ref 6.5–8.1)

## 2019-02-26 LAB — SARS CORONAVIRUS 2 BY RT PCR (HOSPITAL ORDER, PERFORMED IN ~~LOC~~ HOSPITAL LAB): SARS Coronavirus 2: POSITIVE — AB

## 2019-02-26 LAB — LACTIC ACID, PLASMA: Lactic Acid, Venous: 1.7 mmol/L (ref 0.5–1.9)

## 2019-02-26 LAB — C-REACTIVE PROTEIN: CRP: 22.4 mg/dL — ABNORMAL HIGH (ref <1.0)

## 2019-02-26 LAB — SEDIMENTATION RATE: Sed Rate: 126 mm/hr — ABNORMAL HIGH (ref 0–30)

## 2019-02-26 LAB — TROPONIN I (HIGH SENSITIVITY): Troponin I (High Sensitivity): 31 ng/L — ABNORMAL HIGH (ref <18)

## 2019-02-26 LAB — GLUCOSE, CAPILLARY: Glucose-Capillary: 97 mg/dL (ref 70–99)

## 2019-02-26 LAB — BRAIN NATRIURETIC PEPTIDE: B Natriuretic Peptide: 66 pg/mL (ref 0.0–100.0)

## 2019-02-26 LAB — FERRITIN: Ferritin: 192 ng/mL (ref 11–307)

## 2019-02-26 MED ORDER — SODIUM CHLORIDE 0.9 % IV SOLN
500.0000 mg | INTRAVENOUS | Status: DC
  Administered 2019-02-26: 500 mg via INTRAVENOUS
  Filled 2019-02-26 (×2): qty 500

## 2019-02-26 MED ORDER — STERILE WATER FOR INJECTION IV SOLN
INTRAVENOUS | Status: DC
  Administered 2019-02-26: 17:00:00 via INTRAVENOUS
  Filled 2019-02-26 (×3): qty 9.71

## 2019-02-26 MED ORDER — DEXAMETHASONE SODIUM PHOSPHATE 10 MG/ML IJ SOLN
8.0000 mg | Freq: Once | INTRAMUSCULAR | Status: AC
  Administered 2019-02-26: 8 mg via INTRAVENOUS
  Filled 2019-02-26: qty 1

## 2019-02-26 MED ORDER — SODIUM CHLORIDE 0.9 % IV SOLN
2.0000 g | INTRAVENOUS | Status: DC
  Administered 2019-02-26: 2 g via INTRAVENOUS
  Filled 2019-02-26: qty 20

## 2019-02-26 MED ORDER — SODIUM CHLORIDE 0.9 % IV BOLUS
1000.0000 mL | Freq: Once | INTRAVENOUS | Status: AC
  Administered 2019-02-26: 1000 mL via INTRAVENOUS

## 2019-02-26 MED ORDER — SODIUM CHLORIDE 0.9 % IV BOLUS: 1000.0000 mL | Freq: Once | INTRAVENOUS | Status: DC

## 2019-02-26 MED ORDER — ACETAMINOPHEN 500 MG PO TABS
1000.0000 mg | ORAL_TABLET | ORAL | Status: AC
  Administered 2019-02-26: 1000 mg via ORAL
  Filled 2019-02-26: qty 2

## 2019-02-26 MED ORDER — ONDANSETRON HCL 4 MG/2ML IJ SOLN
4.0000 mg | Freq: Once | INTRAMUSCULAR | Status: AC
  Administered 2019-02-26: 4 mg via INTRAVENOUS
  Filled 2019-02-26: qty 2

## 2019-02-26 NOTE — ED Notes (Signed)
Pt assisted onto bedpan as pt requested to use restroom. Pt reports being unsteady.

## 2019-02-26 NOTE — ED Notes (Signed)
Report from kim, rn.  

## 2019-02-26 NOTE — ED Provider Notes (Signed)
[facility] Emergency Department Provider Note   ____________________________________________   First MD Initiated Contact with Patient 02/18/2019 1208     (approximate)  I have reviewed the triage vital signs and the nursing notes.   HISTORY  Chief Complaint Shortness of Breath and Weakness    HPI Nichole Franklin is a 68 y.o. female has a history of diabetes, hypertension hyperlipidemia   Patient reports for last 3 days she has been having chills, body aches, feeling of shortness of breath, feeling generally fatigued.  No abdominal pain.  No headache.  She does report her sister whom she is had notable contact with was diagnosed with coronavirus 2 weeks ago  She was tested once herself negative.  Patient reports she is continued to feel worse, slightly short of breath.  Fatigued, thirsty.  No chest pain.  Shortness of breath, notable last 3 days seems to be just slowly worsening.  No rash.  No tick bites.  Past Medical History:  Diagnosis Date   Allergy    mild   Anxiety    Cancer ([place])    cervical cancer   Degenerative disc disease, lumbar    Diabetes mellitus    Gastroesophageal reflux    Hyperlipidemia    Hypertension    Neuromuscular disorder ([place])    small hiatal hernia    Patient Active Problem List   Diagnosis Date Noted   HTN (hypertension) 12/14/2016   Diabetes ([place]) 12/14/2016   Gastroesophageal reflux     Past Surgical History:  Procedure Laterality Date   ABDOMINAL HYSTERECTOMY     CHOLECYSTECTOMY      Prior to Admission medications   Medication Sig Start Date End Date Taking? Authorizing Provider  DULoxetine (CYMBALTA) 30 MG capsule Take 30 mg by mouth daily.    [provider]  enalapril (VASOTEC) 20 MG tablet Take 20 mg by mouth 2 (two) times daily.    [provider]  gabapentin (NEURONTIN) 300 MG capsule Take 300 mg by mouth 2 (two) times daily.     [provider]    hydrochlorothiazide (HYDRODIURIL) 25 MG tablet Take 25 mg by mouth daily.    [provider]  metFORMIN (GLUCOPHAGE) 500 MG tablet Take 500 mg by mouth 2 (two) times daily.    [provider]  metoCLOPramide (REGLAN) 10 MG tablet Take 1 tablet (10 mg total) by mouth every 6 (six) hours. 05/08/17   Recardo Evangelist, PA-C  naproxen (NAPROSYN) 375 MG tablet Take 1 tablet (375 mg total) by mouth 2 (two) times daily. 01/31/18   Langston Masker B, PA-C  naproxen sodium (ANAPROX) 220 MG tablet Take 440 mg by mouth 2 (two) times daily as needed (pain).    [provider]  NOVOLIN 70/30 RELION (70-30) 100 UNIT/ML injection Inject 30 Units into the skin 2 (two) times daily with a meal.  11/21/16   [provider]  omeprazole (PRILOSEC) 20 MG capsule Take 1 capsule (20 mg total) by mouth 2 (two) times daily before a meal. 03/15/17   Ladene Artist, MD  pravastatin (PRAVACHOL) 80 MG tablet Take 80 mg by mouth at bedtime.    [provider]  [place] .3CC/29G 29G X 1/2" 0.3 ML MISC  11/16/16   [provider]  traMADol (ULTRAM) 50 MG tablet Take 50 mg by mouth 2 (two) times daily.    [provider]    Allergies Patient has no known allergies.  Family History  Problem  Relation Age of Onset   Breast cancer Sister    Diabetes Sister        x 3   Diabetes Brother    Colon cancer Neg Hx    Esophageal cancer Neg Hx    Pancreatic cancer Neg Hx    Stomach cancer Neg Hx    Colon polyps Neg Hx    Rectal cancer Neg Hx     Social History Social History   Tobacco Use   Smoking status: Current Every Day Smoker    Packs/day: 0.25    Types: Cigarettes   Smokeless tobacco: Never Used   Tobacco comment: form given 12/14/16 -3-6 cigs a day   Substance Use Topics   Alcohol use: No   Drug use: No    Review of Systems Constitutional: Chills, fatigue Eyes: No visual changes. ENT: No sore throat. Cardiovascular: Denies  chest pain. Respiratory: See HPI  gastrointestinal: No abdominal pain.   Genitourinary: Negative for dysuria. Musculoskeletal: Negative for back pain. General achiness, discomfort. Skin: Negative for rash. Neurological: Negative for headaches, areas of focal weakness or numbness.    ____________________________________________   PHYSICAL EXAM:  VITAL SIGNS: ED Triage Vitals  Enc Vitals Group     BP 02/03/2019 1204 (!) 145/48     Pulse Rate 02/23/2019 1204 94     Resp 02/11/2019 1204 (!) 28     Temp 02/19/2019 1204 99.2 F (37.3 C)     Temp Source 02/25/2019 1204 Oral     SpO2 02/28/2019 1204 94 %     Weight 02/16/2019 1200 160 lb (72.6 kg)     Height 02/22/2019 1200 5\' 6"  (1.676 m)     Head Circumference --      Peak Flow --      Pain Score 02/27/2019 1200 0     Pain Loc --      Pain Edu? --      Excl. in [place]? --     Constitutional: Alert and oriented.  Appears mildly ill.  Slightly tachypneic. Eyes: Conjunctivae are normal. Head: Atraumatic. Nose: No congestion/rhinnorhea. Mouth/Throat: Mucous membranes are dry. Neck: No stridor.  Cardiovascular: Normal rate, regular rhythm. Grossly normal heart sounds.  Good peripheral circulation. Respiratory: Normal respiratory effort except mild tachypnea and light accessory muscle use.  No retractions. Lungs CTAB.  She speaks in phrases.  She appears mildly short of breath, but her lung sounds are completely clear. Gastrointestinal: Soft and nontender. No distention. Musculoskeletal: No lower extremity tenderness nor edema. Neurologic:  Normal speech and language. No gross focal neurologic deficits are appreciated.  Skin:  Skin is warm, dry and intact. No rash noted. Psychiatric: Mood and affect are normal. Speech and behavior are normal.  ____________________________________________   LABS (all labs ordered are listed, but only abnormal results are displayed)  Labs Reviewed  SARS CORONAVIRUS 2 (HOSPITAL ORDER, [place] LAB) - Abnormal; Notable for the following components:      Result Value   SARS Coronavirus 2 POSITIVE (*)    All other components within normal limits  CBC WITH DIFFERENTIAL/PLATELET - Abnormal; Notable for the following components:   WBC 19.0 (*)    RBC 3.34 (*)    Hemoglobin 9.0 (*)    HCT 28.8 (*)    RDW 16.5 (*)    Neutro Abs 15.1 (*)    Monocytes Absolute 1.1 (*)    Abs Immature Granulocytes 0.51 (*)    All other components within normal limits  COMPREHENSIVE  METABOLIC PANEL - Abnormal; Notable for the following components:   CO2 13 (*)    Glucose, Bld 144 (*)    BUN 81 (*)    Creatinine, Ser 7.09 (*)    Calcium 7.7 (*)    Albumin 3.0 (*)    AST 54 (*)    Alkaline Phosphatase 178 (*)    GFR calc non Af Amer 5 (*)    GFR calc Af Amer 6 (*)    Anion gap 21 (*)    All other components within normal limits  TROPONIN I (HIGH SENSITIVITY) - Abnormal; Notable for the following components:   Troponin I (High Sensitivity) 31 (*)    All other components within normal limits  CULTURE, BLOOD (ROUTINE X 2)  CULTURE, BLOOD (ROUTINE X 2)  PROCALCITONIN  BRAIN NATRIURETIC PEPTIDE  LACTIC ACID, PLASMA  SEDIMENTATION RATE  C-REACTIVE PROTEIN  FERRITIN  CBG MONITORING, ED   ____________________________________________  EKG  Reviewed entered by me at 1205 Heart rate 95 QRS 60 QTc 500 Normal sinus rhythm, T wave inversions 1 aVL.  As compared with the patient's previous EKG from September 2018 no notable changes are found ____________________________________________  RADIOLOGY  Dg Chest Port 1 View  Result Date: 02/14/2019 CLINICAL DATA:  Shortness of breath and cough. EXAM: PORTABLE CHEST 1 VIEW COMPARISON:  02/17/2011 FINDINGS: Patchy bilateral pulmonary opacity with interstitial coarsening. Normal heart size for technique. No effusion or pneumothorax. IMPRESSION: Patchy bilateral infiltrate concerning for pneumonia-especially COVID-19. Electronically Signed   By:  Monte Fantasia M.D.   On: 02/05/2019 13:04   Ct Renal Stone Study  Result Date: 02/13/2019 CLINICAL DATA:  Shortness of breath, dry cough, COVID-19, renal failure EXAM: CT ABDOMEN AND PELVIS WITHOUT CONTRAST TECHNIQUE: Multidetector CT imaging of the abdomen and pelvis was performed following the standard protocol without IV contrast. COMPARISON:  05/08/2017 FINDINGS: Lower chest: Extensive, multifocal, predominantly subpleural ground-glass pulmonary opacity of the bilateral lung bases. Hepatobiliary: No focal liver abnormality is seen. Status post cholecystectomy. No biliary dilatation. Pancreas: Unremarkable. No pancreatic ductal dilatation or surrounding inflammatory changes. Spleen: Normal in size without significant abnormality. Adrenals/Urinary Tract: Adrenal glands are unremarkable. Kidneys are normal, without renal calculi, solid lesion, or hydronephrosis. Bladder is unremarkable. Stomach/Bowel: Stomach is within normal limits. Appendix appears normal. No evidence of bowel wall thickening, distention, or inflammatory changes. Colonic diverticulosis. Vascular/Lymphatic: Aortic atherosclerosis. No enlarged abdominal or pelvic lymph nodes. Reproductive: Status post hysterectomy. Other: No abdominal wall hernia or abnormality. No abdominopelvic ascites. Musculoskeletal: There are partially sclerotic inferior endplate deformities of the T11 and T12 vertebral bodies, new compared to prior examination. IMPRESSION: 1. No acute noncontrast CT findings of the abdomen or pelvis to explain renal failure. No evidence of urinary tract calculus or hydronephrosis. 2. Extensive, multifocal, predominantly subpleural ground-glass pulmonary opacity of the bilateral lung bases, findings in keeping with reported diagnosis of COVID-19. 3. There are partially sclerotic inferior endplate deformities of the T11 and T12 vertebral bodies, new compared to prior examination, although of uncertain acuity. Correlate for acute pain and  point tenderness. 4. Other chronic, incidental, and postoperative findings as detailed above. Electronically Signed   By: Eddie Candle M.D.   On: 02/12/2019 14:58    Imaging results reviewed by me. ____________________________________________   PROCEDURES  Procedure(s) performed: None  Procedures  Critical Care performed: Yes, see critical care note(s)  CRITICAL CARE Performed by: Delman Kitten   Total critical care time: 40 minutes  Critical care time was exclusive of separately billable procedures  and treating other patients.  Critical care was necessary to treat or prevent imminent or life-threatening deterioration.  Critical care was time spent personally by me on the following activities: development of treatment plan with patient and/or surrogate as well as nursing, discussions with consultants, evaluation of patient's response to treatment, examination of patient, obtaining history from patient or surrogate, ordering and performing treatments and interventions, ordering and review of laboratory studies, ordering and review of radiographic studies, pulse oximetry and re-evaluation of patient's condition.  ____________________________________________   INITIAL IMPRESSION / ASSESSMENT AND PLAN / ED COURSE  Pertinent labs & imaging results that were available during my care of the patient were reviewed by me and considered in my medical decision making (see chart for details).   Chills, body aches, fatigue, dyspnea.  Appears slightly dyspneic but clear lung sounds.  Will place on 2 L for comfort.  Work-up including potential high risk for COVID with exposure.  Chest x-ray labs, urinalysis etc. all plan.  Begin hydrating as patient reports decreased intake, fatigue thirst, close follow-up on testing.  Acetaminophen.    Clinical Course as of Feb 26 1544  Sun Feb 26, 2019  1353 Creatinine(!): 7.09 [MQ]  1353 CO2(!): 13 [MQ]  1353 Creatinine(!): 7.09 [MQ]  1353 WBC(!): 19.0  [MQ]  1353 DG Chest Port 1 View [MQ]    Clinical Course User Index [MQ] Delman Kitten, MD   ----------------------------------------- 3:39 PM on 02/22/2019 -----------------------------------------  Patient meets sepsis criteria, definite COVID-19 however also consider bacterial superinfection given her leukocytosis and elevated procalcitonin.  No recent hospitalizations.  Will start the patient on Rocephin and azithromycin, discussed case and care with hospitalist  Discussed and patient accepted by Dr. Tamala Julian to [facility].  Systemwide guidance for COVID-19 patient indicates a need for her to go to [facility] for care with further care, I discussed with the patient and she is in agreement with transfer and understands reason as well as severity of illness.  She has acute renal failure in association with COVID-19  Ongoing ER care and transfer pending assigned to Dr. Ellender Hose.  ____________________________________________   FINAL CLINICAL IMPRESSION(S) / ED DIAGNOSES  Final diagnoses:  Acute renal failure, unspecified acute renal failure type ([place])  COVID-19 virus infection  Sepsis, due to unspecified organism, unspecified whether acute organ dysfunction present [facility])        Note:  This document was prepared using Dragon voice recognition software and may include unintentional dictation errors       Delman Kitten, MD 02/23/2019 1547

## 2019-02-26 NOTE — Progress Notes (Signed)
Pt arrived to room via [place]. Pt alert and oriented with respirations even and unlabored at rest with non-rebreather on. Pt has cane, pocketbook and cell phone. Pt oriented to hospital, bed and call light. Pt extremely anxious and tearful, reassured by staff. Staff assisted patient in calling family and patient calmed.  At 2120 Page to Dr Silas Sacramento on call to notify of arrival, request orders and notify of patients anxiety. Dr Silas [place] quickly called back and requested nurse page Dr Hal Hope. Page to Dr Hal Hope to notify of arrival, request orders and inform of anxiety. Call received back informing RN he would come and see patient. MD arrived at this time and talked with patient and confirmed full code status.

## 2019-02-26 NOTE — ED Notes (Signed)
Pt did not have BM or urinate. Bedpan removed. Pt repositioned in bed. Given new emesis bag as pt keep spitting. Pt worried; this RN reassured pt.

## 2019-02-26 NOTE — ED Notes (Signed)
Pt ringing call bell. Pt states 'somebody help me please". Pt has arm bent that ivf are infusing in and pump is ringing. Pt requesting "that noise be cut off". Pt informed that she will need to keep arm straight for ivf to infuse and beeping is notifing pt to keep arm straight. Pt informed to please keep her mask in place. Pt assisted with mask placing on face. Pt has po fluids at bedside. Pt denies other needs at this time.

## 2019-02-26 NOTE — ED Triage Notes (Addendum)
Pt presents to ED via POV with c/o SHOB, weakness, and dry cough. Pt states symptoms started today. Pt noted to be tachypneic in triage upon arrival to ED. Pt states exposure to Covid+ patient approx 3-4 weeks ago, states had a negative covid test after the exposure.

## 2019-02-26 NOTE — ED Notes (Signed)
[facility] here to transport pt to [place].

## 2019-02-26 NOTE — ED Notes (Signed)
Pt noted to be slow to respond, repeatedly asking "huh?" "what?".

## 2019-02-27 ENCOUNTER — Encounter (HOSPITAL_COMMUNITY): Payer: Self-pay | Admitting: Internal Medicine

## 2019-02-27 DIAGNOSIS — D649 Anemia, unspecified: Secondary | ICD-10-CM | POA: Diagnosis present

## 2019-02-27 DIAGNOSIS — J9601 Acute respiratory failure with hypoxia: Secondary | ICD-10-CM | POA: Diagnosis present

## 2019-02-27 DIAGNOSIS — U071 COVID-19: Secondary | ICD-10-CM | POA: Diagnosis present

## 2019-02-27 DIAGNOSIS — J1289 Other viral pneumonia: Secondary | ICD-10-CM

## 2019-02-27 DIAGNOSIS — E785 Hyperlipidemia, unspecified: Secondary | ICD-10-CM | POA: Diagnosis present

## 2019-02-27 DIAGNOSIS — J1282 Pneumonia due to coronavirus disease 2019: Secondary | ICD-10-CM | POA: Diagnosis present

## 2019-02-27 DIAGNOSIS — E1129 Type 2 diabetes mellitus with other diabetic kidney complication: Secondary | ICD-10-CM | POA: Diagnosis present

## 2019-02-27 DIAGNOSIS — I10 Essential (primary) hypertension: Secondary | ICD-10-CM | POA: Diagnosis present

## 2019-02-27 DIAGNOSIS — N179 Acute kidney failure, unspecified: Secondary | ICD-10-CM | POA: Diagnosis present

## 2019-02-27 LAB — VITAMIN B12: Vitamin B-12: 666 pg/mL (ref 180–914)

## 2019-02-27 LAB — LACTATE DEHYDROGENASE: LDH: 502 U/L — ABNORMAL HIGH (ref 98–192)

## 2019-02-27 LAB — ABO/RH: ABO/RH(D): A POS

## 2019-02-27 LAB — CBC WITH DIFFERENTIAL/PLATELET
Abs Immature Granulocytes: 0.5 10*3/uL — ABNORMAL HIGH (ref 0.00–0.07)
Basophils Absolute: 0 10*3/uL (ref 0.0–0.1)
Basophils Relative: 0 %
Eosinophils Absolute: 0 10*3/uL (ref 0.0–0.5)
Eosinophils Relative: 0 %
HCT: 29.4 % — ABNORMAL LOW (ref 36.0–46.0)
Hemoglobin: 9.1 g/dL — ABNORMAL LOW (ref 12.0–15.0)
Lymphocytes Relative: 7 %
Lymphs Abs: 1.6 10*3/uL (ref 0.7–4.0)
MCH: 27 pg (ref 26.0–34.0)
MCHC: 31 g/dL (ref 30.0–36.0)
MCV: 87.2 fL (ref 80.0–100.0)
Monocytes Absolute: 0 10*3/uL — ABNORMAL LOW (ref 0.1–1.0)
Monocytes Relative: 0 %
Myelocytes: 2 %
Neutro Abs: 20.5 10*3/uL — ABNORMAL HIGH (ref 1.7–7.7)
Neutrophils Relative %: 91 %
Platelets: 315 10*3/uL (ref 150–400)
RBC: 3.37 MIL/uL — ABNORMAL LOW (ref 3.87–5.11)
RDW: 16.9 % — ABNORMAL HIGH (ref 11.5–15.5)
WBC: 22.5 10*3/uL — ABNORMAL HIGH (ref 4.0–10.5)
nRBC: 0 % (ref 0.0–0.2)
nRBC: 0 /100 WBC

## 2019-02-27 LAB — GLUCOSE, CAPILLARY
Glucose-Capillary: 225 mg/dL — ABNORMAL HIGH (ref 70–99)
Glucose-Capillary: 231 mg/dL — ABNORMAL HIGH (ref 70–99)
Glucose-Capillary: 238 mg/dL — ABNORMAL HIGH (ref 70–99)
Glucose-Capillary: 254 mg/dL — ABNORMAL HIGH (ref 70–99)
Glucose-Capillary: 284 mg/dL — ABNORMAL HIGH (ref 70–99)
Glucose-Capillary: 304 mg/dL — ABNORMAL HIGH (ref 70–99)

## 2019-02-27 LAB — BASIC METABOLIC PANEL
Anion gap: 27 — ABNORMAL HIGH (ref 5–15)
BUN: 80 mg/dL — ABNORMAL HIGH (ref 8–23)
CO2: 8 mmol/L — ABNORMAL LOW (ref 22–32)
Calcium: 7.2 mg/dL — ABNORMAL LOW (ref 8.9–10.3)
Chloride: 103 mmol/L (ref 98–111)
Creatinine, Ser: 6.85 mg/dL — ABNORMAL HIGH (ref 0.44–1.00)
GFR calc Af Amer: 7 mL/min — ABNORMAL LOW (ref >60)
GFR calc non Af Amer: 6 mL/min — ABNORMAL LOW (ref >60)
Glucose, Bld: 294 mg/dL — ABNORMAL HIGH (ref 70–99)
Potassium: 4.9 mmol/L (ref 3.5–5.1)
Sodium: 138 mmol/L (ref 135–145)

## 2019-02-27 LAB — RETICULOCYTES
Immature Retic Fract: 6.5 % (ref 2.3–15.9)
RBC.: 3.37 MIL/uL — ABNORMAL LOW (ref 3.87–5.11)
Retic Count, Absolute: 16.5 10*3/uL — ABNORMAL LOW (ref 19.0–186.0)
Retic Ct Pct: 0.5 % (ref 0.4–3.1)

## 2019-02-27 LAB — IRON AND TIBC
Iron: 14 ug/dL — ABNORMAL LOW (ref 28–170)
Saturation Ratios: 4 % — ABNORMAL LOW (ref 10.4–31.8)
TIBC: 350 ug/dL (ref 250–450)
UIBC: 336 ug/dL

## 2019-02-27 LAB — FERRITIN: Ferritin: 244 ng/mL (ref 11–307)

## 2019-02-27 LAB — HEPATIC FUNCTION PANEL
ALT: 21 U/L (ref 0–44)
AST: 48 U/L — ABNORMAL HIGH (ref 15–41)
Albumin: 2.6 g/dL — ABNORMAL LOW (ref 3.5–5.0)
Alkaline Phosphatase: 164 U/L — ABNORMAL HIGH (ref 38–126)
Bilirubin, Direct: 0.3 mg/dL — ABNORMAL HIGH (ref 0.0–0.2)
Indirect Bilirubin: 0.7 mg/dL (ref 0.3–0.9)
Total Bilirubin: 1 mg/dL (ref 0.3–1.2)
Total Protein: 7.4 g/dL (ref 6.5–8.1)

## 2019-02-27 LAB — CREATININE, URINE, RANDOM: Creatinine, Urine: 58.39 mg/dL

## 2019-02-27 LAB — C-REACTIVE PROTEIN: CRP: 26.9 mg/dL — ABNORMAL HIGH (ref <1.0)

## 2019-02-27 LAB — HEMOGLOBIN A1C
Hgb A1c MFr Bld: 9.3 % — ABNORMAL HIGH (ref 4.8–5.6)
Mean Plasma Glucose: 220.21 mg/dL

## 2019-02-27 LAB — TYPE AND SCREEN
ABO/RH(D): A POS
Antibody Screen: NEGATIVE

## 2019-02-27 LAB — SODIUM, URINE, RANDOM: Sodium, Ur: 76 mmol/L

## 2019-02-27 LAB — FOLATE: Folate: 15.1 ng/mL (ref >5.9)

## 2019-02-27 LAB — HIV ANTIBODY (ROUTINE TESTING W REFLEX): HIV Screen 4th Generation wRfx: NONREACTIVE

## 2019-02-27 MED ORDER — VITAMIN D 25 MCG (1000 UNIT) PO TABS
1000.0000 [IU] | ORAL_TABLET | Freq: Every day | ORAL | Status: DC
  Administered 2019-02-27 – 2019-03-03 (×5): 1000 [IU] via ORAL
  Filled 2019-02-27 (×6): qty 1

## 2019-02-27 MED ORDER — ZINC SULFATE 220 (50 ZN) MG PO CAPS
220.0000 mg | ORAL_CAPSULE | Freq: Every day | ORAL | Status: DC
  Administered 2019-02-27 – 2019-03-03 (×5): 220 mg via ORAL
  Filled 2019-02-27 (×5): qty 1

## 2019-02-27 MED ORDER — SODIUM CHLORIDE 0.9 % IV SOLN
INTRAVENOUS | Status: DC
  Administered 2019-02-27: 01:00:00 via INTRAVENOUS

## 2019-02-27 MED ORDER — SODIUM CHLORIDE 0.9 % IV SOLN
1.0000 g | INTRAVENOUS | Status: DC
  Administered 2019-02-27 – 2019-03-04 (×6): 1 g via INTRAVENOUS
  Filled 2019-02-27 (×5): qty 10
  Filled 2019-02-27: qty 1

## 2019-02-27 MED ORDER — DULOXETINE HCL 30 MG PO CPEP
30.0000 mg | ORAL_CAPSULE | Freq: Every day | ORAL | Status: DC
  Administered 2019-02-27 – 2019-03-04 (×6): 30 mg via ORAL
  Filled 2019-02-27 (×8): qty 1

## 2019-02-27 MED ORDER — HYDROMORPHONE HCL 1 MG/ML IJ SOLN
0.5000 mg | Freq: Once | INTRAMUSCULAR | Status: AC
  Administered 2019-02-27: 0.5 mg via INTRAVENOUS
  Filled 2019-02-27: qty 1

## 2019-02-27 MED ORDER — HYDRALAZINE HCL 20 MG/ML IJ SOLN
10.0000 mg | INTRAMUSCULAR | Status: DC | PRN
  Administered 2019-02-27 – 2019-03-10 (×12): 10 mg via INTRAVENOUS
  Filled 2019-02-27 (×13): qty 1

## 2019-02-27 MED ORDER — PRAVASTATIN SODIUM 40 MG PO TABS
80.0000 mg | ORAL_TABLET | Freq: Every day | ORAL | Status: DC
  Administered 2019-02-28 – 2019-03-02 (×3): 80 mg via ORAL
  Filled 2019-02-27 (×4): qty 2

## 2019-02-27 MED ORDER — LEVALBUTEROL TARTRATE 45 MCG/ACT IN AERO
2.0000 | INHALATION_SPRAY | Freq: Three times a day (TID) | RESPIRATORY_TRACT | Status: DC
  Administered 2019-02-27 – 2019-03-03 (×13): 2 via RESPIRATORY_TRACT
  Filled 2019-02-27: qty 15

## 2019-02-27 MED ORDER — METHYLPREDNISOLONE SODIUM SUCC 125 MG IJ SOLR
60.0000 mg | Freq: Two times a day (BID) | INTRAMUSCULAR | Status: DC
  Administered 2019-02-27 – 2019-03-04 (×10): 60 mg via INTRAVENOUS
  Filled 2019-02-27 (×11): qty 2

## 2019-02-27 MED ORDER — INSULIN ASPART 100 UNIT/ML ~~LOC~~ SOLN
0.0000 [IU] | SUBCUTANEOUS | Status: DC
  Administered 2019-02-27: 5 [IU] via SUBCUTANEOUS
  Administered 2019-02-27: 7 [IU] via SUBCUTANEOUS
  Administered 2019-02-27: 3 [IU] via SUBCUTANEOUS
  Administered 2019-02-27: 5 [IU] via SUBCUTANEOUS
  Administered 2019-02-27: 3 [IU] via SUBCUTANEOUS
  Administered 2019-02-27 – 2019-02-28 (×4): 5 [IU] via SUBCUTANEOUS
  Administered 2019-02-28: 3 [IU] via SUBCUTANEOUS
  Administered 2019-02-28 (×2): 5 [IU] via SUBCUTANEOUS
  Administered 2019-02-28: 3 [IU] via SUBCUTANEOUS
  Administered 2019-03-01: 08:00:00 2 [IU] via SUBCUTANEOUS
  Administered 2019-03-01: 5 [IU] via SUBCUTANEOUS
  Administered 2019-03-01: 17:00:00 3 [IU] via SUBCUTANEOUS
  Administered 2019-03-01 (×2): 2 [IU] via SUBCUTANEOUS
  Administered 2019-03-02: 10:00:00 3 [IU] via SUBCUTANEOUS
  Administered 2019-03-02 (×5): 5 [IU] via SUBCUTANEOUS
  Administered 2019-03-03 (×2): 3 [IU] via SUBCUTANEOUS

## 2019-02-27 MED ORDER — METHYLPREDNISOLONE SODIUM SUCC 40 MG IJ SOLR: 40.0000 mg | Freq: Two times a day (BID) | INTRAMUSCULAR | Status: DC

## 2019-02-27 MED ORDER — ZOLPIDEM TARTRATE 5 MG PO TABS
5.0000 mg | ORAL_TABLET | Freq: Every evening | ORAL | Status: DC | PRN
  Administered 2019-02-27 – 2019-03-02 (×4): 5 mg via ORAL
  Filled 2019-02-27 (×4): qty 1

## 2019-02-27 MED ORDER — VITAMIN C 500 MG/5ML PO SYRP
1000.0000 mg | ORAL_SOLUTION | Freq: Every day | ORAL | Status: DC
  Filled 2019-02-27 (×2): qty 10

## 2019-02-27 MED ORDER — ONDANSETRON HCL 4 MG/2ML IJ SOLN
4.0000 mg | Freq: Four times a day (QID) | INTRAMUSCULAR | Status: DC | PRN
  Administered 2019-02-28 – 2019-03-02 (×2): 4 mg via INTRAVENOUS
  Filled 2019-02-27 (×2): qty 2

## 2019-02-27 MED ORDER — IPRATROPIUM BROMIDE HFA 17 MCG/ACT IN AERS
2.0000 | INHALATION_SPRAY | Freq: Three times a day (TID) | RESPIRATORY_TRACT | Status: DC
  Administered 2019-02-27 – 2019-03-03 (×13): 2 via RESPIRATORY_TRACT
  Filled 2019-02-27: qty 12.9

## 2019-02-27 MED ORDER — TRAMADOL HCL 50 MG PO TABS: 50.0000 mg | ORAL_TABLET | Freq: Two times a day (BID) | ORAL | Status: DC

## 2019-02-27 MED ORDER — DEXAMETHASONE SODIUM PHOSPHATE 10 MG/ML IJ SOLN: 6.0000 mg | Freq: Every day | INTRAMUSCULAR | Status: DC

## 2019-02-27 MED ORDER — INSULIN GLARGINE 100 UNIT/ML ~~LOC~~ SOLN
7.0000 [IU] | Freq: Every day | SUBCUTANEOUS | Status: DC
  Administered 2019-02-27 – 2019-03-02 (×4): 7 [IU] via SUBCUTANEOUS
  Filled 2019-02-27 (×5): qty 0.07

## 2019-02-27 MED ORDER — STERILE WATER FOR INJECTION IV SOLN
INTRAVENOUS | Status: DC
  Administered 2019-02-27 – 2019-02-28 (×3): via INTRAVENOUS
  Filled 2019-02-27 (×7): qty 850

## 2019-02-27 MED ORDER — ONDANSETRON HCL 4 MG PO TABS: 4.0000 mg | ORAL_TABLET | Freq: Four times a day (QID) | ORAL | Status: DC | PRN

## 2019-02-27 MED ORDER — HEPARIN SODIUM (PORCINE) 5000 UNIT/ML IJ SOLN
5000.0000 [IU] | Freq: Three times a day (TID) | INTRAMUSCULAR | Status: DC
  Administered 2019-02-27 – 2019-03-01 (×7): 5000 [IU] via SUBCUTANEOUS
  Filled 2019-02-27 (×7): qty 1

## 2019-02-27 MED ORDER — ACETAMINOPHEN 650 MG RE SUPP
650.0000 mg | Freq: Four times a day (QID) | RECTAL | Status: DC | PRN
  Administered 2019-02-28: 650 mg via RECTAL
  Filled 2019-02-27: qty 1

## 2019-02-27 MED ORDER — SODIUM CHLORIDE 0.9 % IV SOLN
500.0000 mg | INTRAVENOUS | Status: DC
  Administered 2019-02-27 – 2019-03-03 (×5): 500 mg via INTRAVENOUS
  Filled 2019-02-27 (×6): qty 500

## 2019-02-27 MED ORDER — ACETAMINOPHEN 325 MG PO TABS
650.0000 mg | ORAL_TABLET | Freq: Four times a day (QID) | ORAL | Status: DC | PRN
  Administered 2019-02-27: 650 mg via ORAL
  Filled 2019-02-27 (×2): qty 2

## 2019-02-27 NOTE — H&P (Signed)
History and Physical    Nichole Franklin WIO:[id number] DOB: 1951/07/31 DOA: 02/01/2019  PCP: Elwyn Reach, MD  Patient coming from: Home.  Chief Complaint: Shortness of breath and weakness.  HPI: Nichole Franklin is a 68 y.o. female with history of diabetes mellitus type 2, hypertension, hyperlipidemia has been feeling weak last 3 to 4 days.  Patient had contact with her sister was positive for COVID-19.  Per patient patient had got tested herself previously.  Last few days patient became more weak with subjective feeling of fever chills had some episodes of diarrhea sometimes mixed with blood and has been having poor appetite has not been eating well and became more short of breath.  At this point patient decided to come to the ER at [facility].  ED Course: In the ER patient was found to be febrile tachypneic and chest x-ray showing bilateral infiltrates concerning for COVID-19 pneumonia.  Patient has had labs drawn which shows significantly elevated creatinine of 7 from baseline of 2.6.  Hemoglobin also had dropped by 3 g this is comparison to 3 years ago.  Given the acute renal failure patient underwent CT renal study which does not show any obstruction but does show which is concerning for pneumonia.  Patient's WBC count is elevated procalcitonin is elevated and high-sensitivity troponin was elevated.  Blood cultures were sent started on empiric antibiotics and admitted for further management of respiratory failure secondary pneumonia and renal failure.  Review of Systems: As per HPI, rest all negative.   Past Medical History:  Diagnosis Date   Allergy    mild   Anxiety    Cancer ([place])    cervical cancer   Degenerative disc disease, lumbar    Diabetes mellitus    Gastroesophageal reflux    Hyperlipidemia    Hypertension    Neuromuscular disorder ([place])    small hiatal hernia    Past Surgical History:  Procedure Laterality Date   ABDOMINAL HYSTERECTOMY     CHOLECYSTECTOMY         reports that she has been smoking cigarettes. She has been smoking about 0.25 packs per day. She has never used smokeless tobacco. She reports that she does not drink alcohol or use drugs.  No Known Allergies  Family History  Problem Relation Age of Onset   Breast cancer Sister    Diabetes Sister        x 3   Diabetes Brother    Colon cancer Neg Hx    Esophageal cancer Neg Hx    Pancreatic cancer Neg Hx    Stomach cancer Neg Hx    Colon polyps Neg Hx    Rectal cancer Neg Hx     Prior to Admission medications   Medication Sig Start Date End Date Taking? Authorizing Provider  DULoxetine (CYMBALTA) 30 MG capsule Take 30 mg by mouth daily.    [provider]  enalapril (VASOTEC) 20 MG tablet Take 20 mg by mouth 2 (two) times daily.    [provider]  gabapentin (NEURONTIN) 300 MG capsule Take 300 mg by mouth 2 (two) times daily.     [provider]  hydrochlorothiazide (HYDRODIURIL) 25 MG tablet Take 25 mg by mouth daily.    [provider]  metFORMIN (GLUCOPHAGE) 500 MG tablet Take 500 mg by mouth 2 (two) times daily.    [provider]  metoCLOPramide (REGLAN) 10 MG tablet Take 1 tablet (10 mg total) by mouth every 6 (six) hours. 05/08/17  Recardo Evangelist, PA-C  naproxen (NAPROSYN) 375 MG tablet Take 1 tablet (375 mg total) by mouth 2 (two) times daily. 01/31/18   Langston Masker B, PA-C  naproxen sodium (ANAPROX) 220 MG tablet Take 440 mg by mouth 2 (two) times daily as needed (pain).    [provider]  NOVOLIN 70/30 RELION (70-30) 100 UNIT/ML injection Inject 30 Units into the skin 2 (two) times daily with a meal.  11/21/16   [provider]  omeprazole (PRILOSEC) 20 MG capsule Take 1 capsule (20 mg total) by mouth 2 (two) times daily before a meal. 03/15/17   Ladene Artist, MD  pravastatin (PRAVACHOL) 80 MG tablet Take 80 mg by mouth at bedtime.    [provider]  [place] .3CC/29G 29G  X 1/2" 0.3 ML MISC  11/16/16   [provider]  traMADol (ULTRAM) 50 MG tablet Take 50 mg by mouth 2 (two) times daily.    [provider]    Physical Exam: Vitals:   02/11/2019 2359 02/27/19 0045 02/27/19 0122 02/27/19 0352  BP:  (!) 163/68  (!) 163/83  Pulse: 92 92 93 95  Resp: (!) 24 (!) 25 (!) 23 (!) 30  Temp:      TempSrc:      SpO2: 96% 97% 98% 93%  Weight:      Height:          Constitutional: Moderately built and nourished. Vitals:   02/04/2019 2359 02/27/19 0045 02/27/19 0122 02/27/19 0352  BP:  (!) 163/68  (!) 163/83  Pulse: 92 92 93 95  Resp: (!) 24 (!) 25 (!) 23 (!) 30  Temp:      TempSrc:      SpO2: 96% 97% 98% 93%  Weight:      Height:       Eyes: Anicteric no pallor. ENMT: No discharge from the ears eyes nose and mouth. Neck: No mass felt.  No neck rigidity but no JVD appreciated. Respiratory: No rhonchi or crepitations. Cardiovascular: S1-S2 heard. Abdomen: Soft nontender bowel sounds present. Musculoskeletal: No edema.  No joint effusion. Skin: No rash. Neurologic: Alert awake oriented to time place and person.  Moves all extremities 5 x 5.  No facial asymmetry tongue is midline. Psychiatric: Appears normal per normal affect.   Labs on Admission: I have personally reviewed following labs and imaging studies  CBC: Recent Labs  Lab 02/07/2019 1247 02/27/19 0215  WBC 19.0* 22.5*  NEUTROABS 15.1* 20.5*  HGB 9.0* 9.1*  HCT 28.8* 29.4*  MCV 86.2 87.2  PLT 289 329   Basic Metabolic Panel: Recent Labs  Lab 02/27/2019 1247 02/27/19 0215  NA 138 138  K 4.7 4.9  CL 104 103  CO2 13* 8*  GLUCOSE 144* 294*  BUN 81* 80*  CREATININE 7.09* 6.85*  CALCIUM 7.7* 7.2*   GFR: Estimated Creatinine Clearance: 8.2 mL/min (A) (by C-G formula based on SCr of 6.85 mg/dL (H)). Liver Function Tests: Recent Labs  Lab 02/01/2019 1247 02/27/19 0215  AST 54* 48*  ALT 20 21  ALKPHOS 178* 164*  BILITOT 1.1 1.0  PROT 8.0 7.4  ALBUMIN 3.0* 2.6*    No results for input(s): LIPASE, AMYLASE in the last 168 hours. No results for input(s): AMMONIA in the last 168 hours. Coagulation Profile: No results for input(s): INR, PROTIME in the last 168 hours. Cardiac Enzymes: No results for input(s): CKTOTAL, CKMB, CKMBINDEX, TROPONINI in the last 168 hours. BNP (last 3 results) No results  for input(s): PROBNP in the last 8760 hours. HbA1C: No results for input(s): HGBA1C in the last 72 hours. CBG: Recent Labs  Lab 02/25/2019 1642 02/27/19 0046  GLUCAP 97 254*   Lipid Profile: No results for input(s): CHOL, HDL, LDLCALC, TRIG, CHOLHDL, LDLDIRECT in the last 72 hours. Thyroid Function Tests: No results for input(s): TSH, T4TOTAL, FREET4, T3FREE, THYROIDAB in the last 72 hours. Anemia Panel: Recent Labs    02/21/2019 1553 02/27/19 0215  VITAMINB12  --  666  FOLATE  --  15.1  FERRITIN 192 244  TIBC  --  350  IRON  --  14*  RETICCTPCT  --  0.5   Urine analysis:    Component Value Date/Time   COLORURINE YELLOW 02/23/2015 [place] 02/23/2015 1323   LABSPEC 1.022 02/23/2015 1323   PHURINE 5.5 02/23/2015 1323   GLUCOSEU 500 (A) 02/23/2015 1323   HGBUR NEGATIVE 02/23/2015 1323   BILIRUBINUR NEGATIVE 02/23/2015 1323   KETONESUR NEGATIVE 02/23/2015 1323   PROTEINUR NEGATIVE 02/23/2015 1323   UROBILINOGEN 0.2 02/23/2015 1323   NITRITE NEGATIVE 02/23/2015 1323   LEUKOCYTESUR NEGATIVE 02/23/2015 1323   Sepsis Labs: @LABRCNTIP (procalcitonin:4,lacticidven:4) ) Recent Results (from the past 240 hour(s))  SARS Coronavirus 2 (CEPHEID- Performed in [place] hospital lab), Hosp Order     Status: Abnormal   Collection Time: 02/03/2019 12:47 PM   Specimen: Nasopharyngeal Swab  Result Value Ref Range Status   SARS Coronavirus 2 POSITIVE (A) NEGATIVE Final    Comment: RESULT CALLED TO, READ BACK BY AND VERIFIED WITH: KIM MAIN @1410  ON 02/21/2019 BY FMW (NOTE) If result is NEGATIVE SARS-CoV-2 target nucleic acids are  NOT DETECTED. The SARS-CoV-2 RNA is generally detectable in upper and lower  respiratory specimens during the acute phase of infection. The lowest  concentration of SARS-CoV-2 viral copies this assay can detect is 250  copies / mL. A negative result does not preclude SARS-CoV-2 infection  and should not be used as the sole basis for treatment or other  patient management decisions.  A negative result may occur with  improper specimen collection / handling, submission of specimen other  than nasopharyngeal swab, presence of viral mutation(s) within the  areas targeted by this assay, and inadequate number of viral copies  (<250 copies / mL). A negative result must be combined with clinical  observations, patient history, and epidemiological information. If result is POSITIVE SARS-CoV-2 target nucleic acids are DETECTED. T he SARS-CoV-2 RNA is generally detectable in upper and lower  respiratory specimens during the acute phase of infection.  Positive  results are indicative of active infection with SARS-CoV-2.  Clinical  correlation with patient history and other diagnostic information is  necessary to determine patient infection status.  Positive results do  not rule out bacterial infection or co-infection with other viruses. If result is PRESUMPTIVE POSTIVE SARS-CoV-2 nucleic acids MAY BE PRESENT.   A presumptive positive result was obtained on the submitted specimen  and confirmed on repeat testing.  While 2019 novel coronavirus  (SARS-CoV-2) nucleic acids may be present in the submitted sample  additional confirmatory testing may be necessary for epidemiological  and / or clinical management purposes  to differentiate between  SARS-CoV-2 and other Sarbecovirus currently known to infect humans.  If clinically indicated additional testing with an alternate test  methodology [phone]) is  advised. The SARS-CoV-2 RNA is generally  detectable in upper and lower respiratory specimens  during the acute  phase of infection. The expected result  is Negative. Fact Sheet for Patients:  StrictlyIdeas.no Fact Sheet for Healthcare Providers: BankingDealers.co.za This test is not yet approved or cleared by the [place] FDA and has been authorized for detection and/or diagnosis of SARS-CoV-2 by FDA under an Emergency Use Authorization (EUA).  This EUA will remain in effect (meaning this test can be used) for the duration of the COVID-19 declaration under Section 564(b)(1) of the Act, 21 U.S.C. section 360bbb-3(b)(1), unless the authorization is terminated or revoked sooner. Performed at [facility], [address] [place], [place] [address]   Culture, blood (Routine X 2) w Reflex to ID Panel     Status: None (Preliminary result)   Collection Time: 02/20/2019  3:54 PM   Specimen: BLOOD  Result Value Ref Range Status   Specimen Description BLOOD BLOOD LEFT FOREARM  Final   Special Requests   Final    BOTTLES DRAWN AEROBIC AND ANAEROBIC Blood Culture results may not be optimal due to an excessive volume of blood received in culture bottles   Culture   Final    NO GROWTH < 12 HOURS Performed at [facility], [address] [place], [place] [address]    Report Status PENDING  Incomplete  Culture, blood (Routine X 2) w Reflex to ID Panel     Status: None (Preliminary result)   Collection Time: 02/05/2019  3:54 PM   Specimen: BLOOD  Result Value Ref Range Status   Specimen Description BLOOD LEFT ANTECUBITAL  Final   Special Requests   Final    BOTTLES DRAWN AEROBIC AND ANAEROBIC Blood Culture results may not be optimal due to an excessive volume of blood received in culture bottles   Culture   Final    NO GROWTH < 12 HOURS Performed at [facility], [address] [place], [place] [address]    Report Status PENDING  Incomplete     Radiological Exams on Admission: Dg Chest Port 1  View  Result Date: 02/14/2019 CLINICAL DATA:  Shortness of breath and cough. EXAM: PORTABLE CHEST 1 VIEW COMPARISON:  02/17/2011 FINDINGS: Patchy bilateral pulmonary opacity with interstitial coarsening. Normal heart size for technique. No effusion or pneumothorax. IMPRESSION: Patchy bilateral infiltrate concerning for pneumonia-especially COVID-19. Electronically Signed   By: Monte Fantasia M.D.   On: 02/13/2019 13:04   Ct Renal Stone Study  Result Date: 02/08/2019 CLINICAL DATA:  Shortness of breath, dry cough, COVID-19, renal failure EXAM: CT ABDOMEN AND PELVIS WITHOUT CONTRAST TECHNIQUE: Multidetector CT imaging of the abdomen and pelvis was performed following the standard protocol without IV contrast. COMPARISON:  05/08/2017 FINDINGS: Lower chest: Extensive, multifocal, predominantly subpleural ground-glass pulmonary opacity of the bilateral lung bases. Hepatobiliary: No focal liver abnormality is seen. Status post cholecystectomy. No biliary dilatation. Pancreas: Unremarkable. No pancreatic ductal dilatation or surrounding inflammatory changes. Spleen: Normal in size without significant abnormality. Adrenals/Urinary Tract: Adrenal glands are unremarkable. Kidneys are normal, without renal calculi, solid lesion, or hydronephrosis. Bladder is unremarkable. Stomach/Bowel: Stomach is within normal limits. Appendix appears normal. No evidence of bowel wall thickening, distention, or inflammatory changes. Colonic diverticulosis. Vascular/Lymphatic: Aortic atherosclerosis. No enlarged abdominal or pelvic lymph nodes. Reproductive: Status post hysterectomy. Other: No abdominal wall hernia or abnormality. No abdominopelvic ascites. Musculoskeletal: There are partially sclerotic inferior endplate deformities of the T11 and T12 vertebral bodies, new compared to prior examination. IMPRESSION: 1. No acute noncontrast CT findings of the abdomen or pelvis to explain renal failure. No evidence of urinary tract  calculus or hydronephrosis. 2.  Extensive, multifocal, predominantly subpleural ground-glass pulmonary opacity of the bilateral lung bases, findings in keeping with reported diagnosis of COVID-19. 3. There are partially sclerotic inferior endplate deformities of the T11 and T12 vertebral bodies, new compared to prior examination, although of uncertain acuity. Correlate for acute pain and point tenderness. 4. Other chronic, incidental, and postoperative findings as detailed above. Electronically Signed   By: Eddie Candle M.D.   On: 02/18/2019 14:58     Assessment/Plan Principal Problem:   Acute respiratory failure with hypoxia (HCC) Active Problems:   Acute renal failure (ARF) (HCC)   DM (diabetes mellitus), type 2 with renal complications (HCC)   Normocytic anemia   Pneumonia due to COVID-19 virus   Essential hypertension   HLD (hyperlipidemia)    1. Acute respiratory failure with hypoxia secondary to COVID pneumonia -discussed with on-call pulmonary critical care who advised to start patient on Decadron.  To repeat labs including CRP ferritin and if shows increasing trend may have to start patient on Remdesivir.  Until blood cultures available we will keep patient on antibiotics. 2. Acute renal failure -appears to be nonoliguric.  Will check FENa.  CT renal does not show any obstruction.  Discussed with pulmonary critical care and at this time per primary critical care no definite contraindication for fluids given the COVID-19.  Will gently hydrate.  Hold hydrochlorothiazide ACE inhibitor as patient also did take some NSAIDs because of the pain which could be contributing patient's renal failure. 3. Diabetes mellitus type 2 we will keep patient on sliding scale coverage for now since patient states she has not had eaten well last few days and has acute renal failure.  Closely follow CBGs. 4. Anemia appears to be chronic but has worsened.  Patient also has noticed some blood-tinged stools.  Closely  monitor CBC type and screen check anemia panel. 5. Hypertension we will hold ACE inhibitor as hydrochlorothiazide keep patient on PRN IV hydralazine. 6. History of hyperlipidemia check CK levels.  Patient is on statins.  Holding of gabapentin due to renal failure.   DVT prophylaxis: Heparin. Code Status: Full code as confirmed with patient. Family Communication: Discussed with patient. Disposition Plan: Home. Consults called: None. Admission status: Inpatient.   Rise Patience MD [facility] Pager [phone].  If 7PM-7AM, please contact night-coverage [email or web address] Password [facility]  02/27/2019, 4:29 AM

## 2019-02-27 NOTE — Progress Notes (Signed)
Pt has not voided since arrival. Pt encouraged to void but says she can not. Bladder scan showed >867. Text page to MD on call with order for in and out cath. In and out cath performed using sterile technique with 850cc non-odorous clear yellow urine obtained.

## 2019-02-27 NOTE — Progress Notes (Signed)
Inpatient Diabetes Program Recommendations  AACE/ADA: New Consensus Statement on Inpatient Glycemic Control (2015)  Target Ranges:  Prepandial:   less than 140 mg/dL      Peak postprandial:   less than 180 mg/dL (1-2 hours)      Critically ill patients:  140 - 180 mg/dL   Results for Nichole Franklin, Nichole Franklin (MRN [id number]) as of 02/27/2019 10:45  Ref. Range 02/11/2019 16:42 02/27/2019 00:46 02/27/2019 05:29 02/27/2019 07:44  Glucose-Capillary Latest Ref Range: 70 - 99 mg/dL 97 254 (H) Novolog 5 units given 304 (H) Novolog 7 units given  284 (H) Novolog 5 units given   Review of Glycemic Control  Diabetes history: DM 2 Outpatient Diabetes medications: 70/30 30 units BID (basal equivalent 42 units, short acting insulin equivalent 18 units), Metformin 500 mg bid  Current orders for Inpatient glycemic control:  Novolog 0-9 units Q4 hours  Inpatient Diabetes Program Recommendations:    Patient on Decadron 6 mg Daily. Glucose trends increased. Patient takes 70/30 insulin at home with basal insulin.   Consider Lantus 12-14 units (less than 0.2 units/kg).  May also need to add meal coverage if glucose trends continue to increase after meals.  Thanks,  Tama Headings RN, MSN, BC-ADM Inpatient Diabetes Coordinator Team Pager [phone] (8a-5p)

## 2019-02-27 NOTE — Progress Notes (Addendum)
Nichole Franklin is a 68 y.o. female with history of diabetes mellitus type 2, hypertension, hyperlipidemia has been feeling weak last 3 to 4 days.  Patient had contact with her sister was positive for COVID-19.  Per patient patient had got tested herself previously.  Last few days patient became more weak with subjective feeling of fever chills had some episodes of diarrhea sometimes mixed with blood and has been having poor appetite has not been eating well and became more short of breath.  At this point patient decided to come to the ER at [facility].  In the ER patient was found to be febrile tachypneic and chest x-ray showing bilateral infiltrates concerning for COVID-19 pneumonia.  Patient has had labs drawn which shows significantly elevated creatinine of 7 from baseline of 2.6.  Hemoglobin also had dropped by 3 g this is comparison to 3 years ago.  Given the acute renal failure patient underwent CT renal study which does not show any obstruction but does show which is concerning for pneumonia.  Patient's WBC count is elevated procalcitonin is elevated and high-sensitivity troponin was elevated.  Blood cultures were sent started on empiric antibiotics and admitted for further management of respiratory failure secondary pneumonia and renal failure.  COVID-19 positive.  02/27/19: Patient was seen and examined in her room. Denies chest pain. Mild Abdominal tenderness on palpation. Patient had some urine retention this AM, now post straight in and out cath. Significant worsening of renal function on admission. Cr 6.8 from 7.09 on presentation. Baseline from records 2.61 with GFR in the 20's from 2018. States she does not see a nephrologist outpatient.  Urine retention this am. Straight in and out cath. If recurs, insert indwelling foley cath.  Severe metabolic acidosis with TK35- 8 in the setting of AKI on CKD4. Started on isotonic bicarb. Discussed with nephrology Dr. Carolynne Edouard. No indication for hemodialysis at  this time. Will transfer to [facility].  Leukocytosis also noted with bilateral patchy infiltrates. Personally reviewed.  Patient on IV solumedrol 60 mg BID, levalbuterol inhaler and ipratoprium inhaler TID, vitamin C, D3, zinc. Also on IV azithromycin and rocephin empirically for possible CAP. Procalcitonin 4.54.  Transfer to [facility] to continue care.  Please refer to H&P dictated by Dr. Hal Hope on 02/27/19 for further details of the assessment and plan.  UPDATE: Updated the patient's daughter Nichole Franklin [phone] via phone. She wants to talk to her sister in [place] who is an ICU RN prior to giving consent for Remdesivir and Actemra. She states she will give the hospital a call this afternoon with their decision.

## 2019-02-28 ENCOUNTER — Inpatient Hospital Stay (HOSPITAL_COMMUNITY): Payer: Medicare HMO

## 2019-02-28 LAB — CBC WITH DIFFERENTIAL/PLATELET
Abs Immature Granulocytes: 0.77 10*3/uL — ABNORMAL HIGH (ref 0.00–0.07)
Basophils Absolute: 0 10*3/uL (ref 0.0–0.1)
Basophils Relative: 0 %
Eosinophils Absolute: 0 10*3/uL (ref 0.0–0.5)
Eosinophils Relative: 0 %
HCT: 25.9 % — ABNORMAL LOW (ref 36.0–46.0)
Hemoglobin: 8.7 g/dL — ABNORMAL LOW (ref 12.0–15.0)
Immature Granulocytes: 3 %
Lymphocytes Relative: 7 %
Lymphs Abs: 1.8 10*3/uL (ref 0.7–4.0)
MCH: 27.2 pg (ref 26.0–34.0)
MCHC: 33.6 g/dL (ref 30.0–36.0)
MCV: 80.9 fL (ref 80.0–100.0)
Monocytes Absolute: 1.1 10*3/uL — ABNORMAL HIGH (ref 0.1–1.0)
Monocytes Relative: 5 %
Neutro Abs: 20.6 10*3/uL — ABNORMAL HIGH (ref 1.7–7.7)
Neutrophils Relative %: 85 %
Platelets: 329 10*3/uL (ref 150–400)
RBC: 3.2 MIL/uL — ABNORMAL LOW (ref 3.87–5.11)
RDW: 16.1 % — ABNORMAL HIGH (ref 11.5–15.5)
WBC: 24.3 10*3/uL — ABNORMAL HIGH (ref 4.0–10.5)
nRBC: 0.2 % (ref 0.0–0.2)

## 2019-02-28 LAB — GLUCOSE, CAPILLARY
Glucose-Capillary: 253 mg/dL — ABNORMAL HIGH (ref 70–99)
Glucose-Capillary: 284 mg/dL — ABNORMAL HIGH (ref 70–99)
Glucose-Capillary: 277 mg/dL — ABNORMAL HIGH (ref 70–99)
Glucose-Capillary: 295 mg/dL — ABNORMAL HIGH (ref 70–99)
Glucose-Capillary: 269 mg/dL — ABNORMAL HIGH (ref 70–99)
Glucose-Capillary: 242 mg/dL — ABNORMAL HIGH (ref 70–99)
Glucose-Capillary: 249 mg/dL — ABNORMAL HIGH (ref 70–99)

## 2019-02-28 LAB — BLOOD GAS, ARTERIAL
Acid-base deficit: 5.7 mmol/L — ABNORMAL HIGH (ref 0.0–2.0)
Bicarbonate: 18.2 mmol/L — ABNORMAL LOW (ref 20.0–28.0)
Drawn by: 441371
O2 Content: 3 L/min
O2 Saturation: 88.7 %
Patient temperature: 97.4
pCO2 arterial: 28.7 mmHg — ABNORMAL LOW (ref 32.0–48.0)
pH, Arterial: 7.414 (ref 7.350–7.450)
pO2, Arterial: 56.1 mmHg — ABNORMAL LOW (ref 83.0–108.0)

## 2019-02-28 LAB — COMPREHENSIVE METABOLIC PANEL
ALT: 18 U/L (ref 0–44)
AST: 41 U/L (ref 15–41)
Albumin: 2.4 g/dL — ABNORMAL LOW (ref 3.5–5.0)
Alkaline Phosphatase: 140 U/L — ABNORMAL HIGH (ref 38–126)
Anion gap: 18 — ABNORMAL HIGH (ref 5–15)
BUN: 81 mg/dL — ABNORMAL HIGH (ref 8–23)
CO2: 16 mmol/L — ABNORMAL LOW (ref 22–32)
Calcium: 6.9 mg/dL — ABNORMAL LOW (ref 8.9–10.3)
Chloride: 106 mmol/L (ref 98–111)
Creatinine, Ser: 5.87 mg/dL — ABNORMAL HIGH (ref 0.44–1.00)
GFR calc Af Amer: 8 mL/min — ABNORMAL LOW (ref >60)
GFR calc non Af Amer: 7 mL/min — ABNORMAL LOW (ref >60)
Glucose, Bld: 312 mg/dL — ABNORMAL HIGH (ref 70–99)
Potassium: 3.8 mmol/L (ref 3.5–5.1)
Sodium: 140 mmol/L (ref 135–145)
Total Bilirubin: 0.9 mg/dL (ref 0.3–1.2)
Total Protein: 6.8 g/dL (ref 6.5–8.1)

## 2019-02-28 LAB — SEDIMENTATION RATE: Sed Rate: 129 mm/hr — ABNORMAL HIGH (ref 0–22)

## 2019-02-28 LAB — MAGNESIUM: Magnesium: 1.4 mg/dL — ABNORMAL LOW (ref 1.7–2.4)

## 2019-02-28 LAB — TYPE AND SCREEN
ABO/RH(D): A POS
Antibody Screen: NEGATIVE

## 2019-02-28 LAB — D-DIMER, QUANTITATIVE: D-Dimer, Quant: 9.32 ug/mL-FEU — ABNORMAL HIGH (ref 0.00–0.50)

## 2019-02-28 LAB — C-REACTIVE PROTEIN: CRP: 12.2 mg/dL — ABNORMAL HIGH (ref <1.0)

## 2019-02-28 LAB — FIBRINOGEN: Fibrinogen: 740 mg/dL — ABNORMAL HIGH (ref 210–475)

## 2019-02-28 LAB — LACTATE DEHYDROGENASE: LDH: 570 U/L — ABNORMAL HIGH (ref 98–192)

## 2019-02-28 LAB — ABO/RH: ABO/RH(D): A POS

## 2019-02-28 LAB — FERRITIN: Ferritin: 138 ng/mL (ref 11–307)

## 2019-02-28 MED ORDER — MORPHINE SULFATE (PF) 2 MG/ML IV SOLN
1.0000 mg | INTRAVENOUS | Status: AC | PRN
  Administered 2019-02-28 (×2): 1 mg via INTRAVENOUS
  Filled 2019-02-28 (×2): qty 1

## 2019-02-28 MED ORDER — VITAMIN C 500 MG PO TABS
1000.0000 mg | ORAL_TABLET | Freq: Every day | ORAL | Status: DC
  Administered 2019-02-28 – 2019-03-03 (×4): 1000 mg via ORAL
  Filled 2019-02-28 (×4): qty 2

## 2019-02-28 MED ORDER — LABETALOL HCL 5 MG/ML IV SOLN
10.0000 mg | Freq: Three times a day (TID) | INTRAVENOUS | Status: DC
  Administered 2019-02-28 – 2019-03-11 (×35): 10 mg via INTRAVENOUS
  Filled 2019-02-28 (×35): qty 4

## 2019-02-28 MED ORDER — HYDRALAZINE HCL 20 MG/ML IJ SOLN
5.0000 mg | Freq: Once | INTRAMUSCULAR | Status: AC
  Administered 2019-02-28: 5 mg via INTRAVENOUS
  Filled 2019-02-28: qty 1

## 2019-02-28 MED ORDER — FUROSEMIDE 10 MG/ML IJ SOLN
40.0000 mg | Freq: Once | INTRAMUSCULAR | Status: AC
  Administered 2019-02-28: 40 mg via INTRAVENOUS
  Filled 2019-02-28: qty 4

## 2019-02-28 MED ORDER — MAGNESIUM SULFATE 4 GM/100ML IV SOLN
4.0000 g | Freq: Once | INTRAVENOUS | Status: AC
  Administered 2019-02-28: 4 g via INTRAVENOUS
  Filled 2019-02-28: qty 100

## 2019-02-28 MED ORDER — FERROUS SULFATE 325 (65 FE) MG PO TABS
325.0000 mg | ORAL_TABLET | Freq: Two times a day (BID) | ORAL | Status: DC
  Administered 2019-02-28 – 2019-03-04 (×8): 325 mg via ORAL
  Filled 2019-02-28 (×10): qty 1

## 2019-02-28 MED ORDER — AMLODIPINE BESYLATE 5 MG PO TABS
10.0000 mg | ORAL_TABLET | Freq: Every day | ORAL | Status: DC
  Filled 2019-02-28: qty 1

## 2019-02-28 NOTE — Progress Notes (Signed)
PROGRESS NOTE  Nichole Franklin FUX:[id number] DOB: 1951-06-27 DOA: 02/25/2019 PCP: Elwyn Reach, MD  HPI/Recap of past 24 hours: Nichole Franklin a 68 y.o.femalewithhistory of diabetes mellitus type 2, hypertension, hyperlipidemia has been feeling weak last 3 to 4 days. Patient had contact with her sister was positive for COVID-19. Per patient patient had got tested herself previously. Last few days patient became more weak with subjective feeling of fever chills had some episodes of diarrhea sometimes mixed with blood and has been having poor appetite has not been eating well and became more short of breath. At this point patient decided to come to the ER at [facility].  In the ER patient was found to be febrile tachypneic and chest x-ray showing bilateral infiltrates concerning for COVID-19 pneumonia. Patient has had labs drawn which shows significantly elevated creatinine of 7 from baseline of 2.6. Hemoglobin also had dropped by 3 g this is comparison to 3 years ago. Given the acute renal failure patient underwent CT renal study which does not show any obstruction but does show which is concerning for pneumonia. Patient's WBC count is elevated procalcitonin is elevated and high-sensitivity troponin was elevated. Blood cultures were sent started on empiric antibiotics and admitted for further management of respiratory failure secondary pneumonia and renal failure.  COVID-19 positive.  Admitted for COVID-19 viral pneumonia complicated by community-acquired pneumonia with procalcitonin of 4.54.  Also presented with AKI on CKD 4 with a creatinine of 7.09 on admission complicated by severe metabolic acidosis.  Was started on bicarb drip with improvement of renal function.  For COVID-19 pneumonia she is currently on IV Solu-Medrol 60 mg twice daily, levalbuterol and ipratropium inhalers 3 times daily, vitamin C, D3, and zinc, Rocephin and IV azithromycin for CAP.  Plan to transfer to [facility] to continue  care.  Acute urinary retention noted on 02/27/2019 for which a Foley catheter was inserted.  Updated the patient's daughter [name] [phone] via phone on 02/27/19. She wants to talk to her sister in [place] who is an ICU RN prior to giving consent for Remdesivir and Actemra. She states she will give the hospital a call with their decision.  02/28/19: Patient seen and examined at her bedside.  She looks very uncomfortable in bed and yells out help me help me.  When asked if she is short of breath she states no, however she is saturating 90% on 4 L on monitor in the room.  She is confused.  She does not know where she is.  Per discussion with her daughter yesterday she is usually very sharp.  Ordered stat ABG and stat chest x-ray.  Independently reviewed chest x-ray done today which shows worsening patchy infiltrates bilaterally most consistent with COVID-19 viral infection.  PCCM consulted and will see the patient.  Discussed with Dr. Ander Slade.  Assessment/Plan: Principal Problem:   Acute respiratory failure with hypoxia (HCC) Active Problems:   Acute renal failure (ARF) (HCC)   DM (diabetes mellitus), type 2 with renal complications (HCC)   Normocytic anemia   Pneumonia due to COVID-19 virus   Essential hypertension   HLD (hyperlipidemia)  Acute hypoxic respiratory failure likely secondary to COVID-19 viral pneumonia complicated by CAP and worsening CKD 4. Not on oxygen supplementation at home at baseline Decline noted now requiring 4-5 L nasal cannula to maintain O2 saturation greater than 90% Repeat a chest x-ray done this morning 02/18/2019 showed worsening bilateral pulmonary infiltrates indicative of COVID-19 pneumonia ABG ordered and pending PCCM consulted, discussed with Dr.  Olalere Continue IV Solu-Medrol 60 mg twice daily, levalbuterol and ipratropium inhalers 3 times daily Maintain O2 saturation greater than 92% High risk for intubation  COVID-19 viral pneumonia Management as  stated above In addition she is on vitamin C, D3, zinc Due to worsening mentation is now n.p.o.  CAP, POA Presented with procalcitonin 4.54 Currently on Rocephin, IV azithromycin Continue IV antibiotics Repeat procalcitonin tomorrow Maintain O2 saturation greater than 92% Continue management as stated above  Acute metabolic encephalopathy likely secondary to COVID-19 infection versus others Per her daughter she has no history of confusion No focal deficit on exam Reorient as needed Treat underlying conditions  AKI on CKD 4 Baseline creatinine appears to be 2.5 with GFR of 27 Presented with creatinine of 7.09 On isotonic bicarb due to severe metabolic acidosis Creatinine is trending down Creatinine today 5.87 Obtain daily BMPs  Severe metabolic acidosis likely secondary to acute renal failure Presented with bicarb of 8 Was started on isotonic bicarb drip Bicarb has improved up to 16 this morning Continue daily BMPs  Hypomagnesemia Magnesium 1.4 this morning Replete with 4 g of IV magnesium.  Uncontrolled hypertension Home lisinopril, HCTZ have been held due to AKI Was started on Norvasc 10 mg daily unable to provide due to n.p.o. Started on IV labetalol 10 mg 3 times daily when n.p.o. Continue to monitor vital signs  Type 2 diabetes with hyperglycemia Hold oral anti-glycemic's Continue insulin coverage Hemoglobin A1c 9.3 on 02/27/2019  Risks: High risk for decompensation due to COVID-19 viral infection with pneumonia complicated by community-acquired pneumonia, AKI on CKD 4, severe metabolic acidosis, acute metabolic encephalopathy, multiple comorbidities and advanced age.  Patient will require least 2 midnights for further evaluation and treatment of present condition.   DVT prophylaxis: Heparin subcu 3 times daily Code Status: Full code as confirmed with patient. Family Communication: Discussed with patient. Disposition Plan: Home. Consults called:   PCCM Admission status: Inpatient.   Objective: Vitals:   02/27/19 0925 02/27/19 1204 02/27/19 2231 02/28/19 0700  BP:  (!) 147/65 (!) 182/81 (!) 177/70  Pulse: 90 87    Resp: (!) 25 (!) 21    Temp:  (!) 97.1 F (36.2 C) 98.5 F (36.9 C) (!) 97.4 F (36.3 C)  TempSrc:  Axillary Oral Oral  SpO2: 94% 97%    Weight:      Height:        Intake/Output Summary (Last 24 hours) at 02/28/2019 0745 Last data filed at 02/28/2019 0700 Gross per 24 hour  Intake 1880.83 ml  Output 3300 ml  Net -1419.17 ml   Filed Weights   02/25/2019 2211 02/27/19 0500  Weight: 76.7 kg 76.7 kg    Exam:   General: 68 y.o. year-old female well developed well nourished appears uncomfortable on nasal cannula with O2 saturation 90% on 4 L on the monitor in the room.  Alert but confused.    Cardiovascular: Regular rate and rhythm with no rubs or gallops.  No thyromegaly or JVD noted.    Respiratory: Diffuse rales bilaterally.  Poor inspiratory effort.  Abdomen: Soft nontender nondistended with normal bowel sounds x4 quadrants.  Musculoskeletal: Trace lower extremity edema. 2/4 pulses in all 4 extremities.  Psychiatry: Unable to assess mood due to altered mental status.   Data Reviewed: CBC: Recent Labs  Lab 02/01/2019 1247 02/27/19 0215 02/28/19 0512  WBC 19.0* 22.5* 24.3*  NEUTROABS 15.1* 20.5* 20.6*  HGB 9.0* 9.1* 8.7*  HCT 28.8* 29.4* 25.9*  MCV 86.2 87.2 80.9  PLT 289 315 518   Basic Metabolic Panel: Recent Labs  Lab 02/11/2019 1247 02/27/19 0215 02/28/19 0512  NA 138 138 140  K 4.7 4.9 3.8  CL 104 103 106  CO2 13* 8* 16*  GLUCOSE 144* 294* 312*  BUN 81* 80* 81*  CREATININE 7.09* 6.85* 5.87*  CALCIUM 7.7* 7.2* 6.9*  MG  --   --  1.4*   GFR: Estimated Creatinine Clearance: 9.6 mL/min (A) (by C-G formula based on SCr of 5.87 mg/dL (H)). Liver Function Tests: Recent Labs  Lab 02/02/2019 1247 02/27/19 0215 02/28/19 0512  AST 54* 48* 41  ALT 20 21 18   ALKPHOS 178* 164* 140*   BILITOT 1.1 1.0 0.9  PROT 8.0 7.4 6.8  ALBUMIN 3.0* 2.6* 2.4*   No results for input(s): LIPASE, AMYLASE in the last 168 hours. No results for input(s): AMMONIA in the last 168 hours. Coagulation Profile: No results for input(s): INR, PROTIME in the last 168 hours. Cardiac Enzymes: No results for input(s): CKTOTAL, CKMB, CKMBINDEX, TROPONINI in the last 168 hours. BNP (last 3 results) No results for input(s): PROBNP in the last 8760 hours. HbA1C: Recent Labs    02/27/19 0215  HGBA1C 9.3*   CBG: Recent Labs  Lab 02/27/19 1652 02/27/19 2025 02/28/19 0052 02/28/19 0431 02/28/19 0719  GLUCAP 225* 238* 253* 284* 277*   Lipid Profile: No results for input(s): CHOL, HDL, LDLCALC, TRIG, CHOLHDL, LDLDIRECT in the last 72 hours. Thyroid Function Tests: No results for input(s): TSH, T4TOTAL, FREET4, T3FREE, THYROIDAB in the last 72 hours. Anemia Panel: Recent Labs    02/27/19 0215 02/28/19 0512  VITAMINB12 666  --   FOLATE 15.1  --   FERRITIN 244 138  TIBC 350  --   IRON 14*  --   RETICCTPCT 0.5  --    Urine analysis:    Component Value Date/Time   COLORURINE YELLOW 02/23/2015 1323   APPEARANCEUR CLEAR 02/23/2015 1323   LABSPEC 1.022 02/23/2015 1323   PHURINE 5.5 02/23/2015 1323   GLUCOSEU 500 (A) 02/23/2015 1323   HGBUR NEGATIVE 02/23/2015 1323   BILIRUBINUR NEGATIVE 02/23/2015 1323   KETONESUR NEGATIVE 02/23/2015 1323   PROTEINUR NEGATIVE 02/23/2015 1323   UROBILINOGEN 0.2 02/23/2015 1323   NITRITE NEGATIVE 02/23/2015 1323   LEUKOCYTESUR NEGATIVE 02/23/2015 1323   Sepsis Labs: @LABRCNTIP (procalcitonin:4,lacticidven:4)  ) Recent Results (from the past 240 hour(s))  SARS Coronavirus 2 (CEPHEID- Performed in [place] hospital lab), Hosp Order     Status: Abnormal   Collection Time: 02/14/2019 12:47 PM   Specimen: Nasopharyngeal Swab  Result Value Ref Range Status   SARS Coronavirus 2 POSITIVE (A) NEGATIVE Final    Comment: RESULT CALLED TO, READ BACK BY  AND VERIFIED WITH: KIM MAIN @1410  ON 02/23/2019 BY FMW (NOTE) If result is NEGATIVE SARS-CoV-2 target nucleic acids are NOT DETECTED. The SARS-CoV-2 RNA is generally detectable in upper and lower  respiratory specimens during the acute phase of infection. The lowest  concentration of SARS-CoV-2 viral copies this assay can detect is 250  copies / mL. A negative result does not preclude SARS-CoV-2 infection  and should not be used as the sole basis for treatment or other  patient management decisions.  A negative result may occur with  improper specimen collection / handling, submission of specimen other  than nasopharyngeal swab, presence of viral mutation(s) within the  areas targeted by this assay, and inadequate number of viral copies  (<250 copies / mL). A negative result must be  combined with clinical  observations, patient history, and epidemiological information. If result is POSITIVE SARS-CoV-2 target nucleic acids are DETECTED. T he SARS-CoV-2 RNA is generally detectable in upper and lower  respiratory specimens during the acute phase of infection.  Positive  results are indicative of active infection with SARS-CoV-2.  Clinical  correlation with patient history and other diagnostic information is  necessary to determine patient infection status.  Positive results do  not rule out bacterial infection or co-infection with other viruses. If result is PRESUMPTIVE POSTIVE SARS-CoV-2 nucleic acids MAY BE PRESENT.   A presumptive positive result was obtained on the submitted specimen  and confirmed on repeat testing.  While 2019 novel coronavirus  (SARS-CoV-2) nucleic acids may be present in the submitted sample  additional confirmatory testing may be necessary for epidemiological  and / or clinical management purposes  to differentiate between  SARS-CoV-2 and other Sarbecovirus currently known to infect humans.  If clinically indicated additional testing with an alternate test   methodology [phone]) is  advised. The SARS-CoV-2 RNA is generally  detectable in upper and lower respiratory specimens during the acute  phase of infection. The expected result is Negative. Fact Sheet for Patients:  StrictlyIdeas.no Fact Sheet for Healthcare Providers: BankingDealers.co.za This test is not yet approved or cleared by the [place] FDA and has been authorized for detection and/or diagnosis of SARS-CoV-2 by FDA under an Emergency Use Authorization (EUA).  This EUA will remain in effect (meaning this test can be used) for the duration of the COVID-19 declaration under Section 564(b)(1) of the Act, 21 U.S.C. section 360bbb-3(b)(1), unless the authorization is terminated or revoked sooner. Performed at [facility], [address] [place], [place] [address]   Culture, blood (Routine X 2) w Reflex to ID Panel     Status: None (Preliminary result)   Collection Time: 01/31/2019  3:54 PM   Specimen: BLOOD  Result Value Ref Range Status   Specimen Description BLOOD BLOOD LEFT FOREARM  Final   Special Requests   Final    BOTTLES DRAWN AEROBIC AND ANAEROBIC Blood Culture results may not be optimal due to an excessive volume of blood received in culture bottles   Culture   Final    NO GROWTH < 12 HOURS Performed at [facility], [address] [place], [place] [address]    Report Status PENDING  Incomplete  Culture, blood (Routine X 2) w Reflex to ID Panel     Status: None (Preliminary result)   Collection Time: 02/18/2019  3:54 PM   Specimen: BLOOD  Result Value Ref Range Status   Specimen Description BLOOD LEFT ANTECUBITAL  Final   Special Requests   Final    BOTTLES DRAWN AEROBIC AND ANAEROBIC Blood Culture results may not be optimal due to an excessive volume of blood received in culture bottles   Culture   Final    NO GROWTH < 12 HOURS Performed at [facility], [address]  [place], [place] [address]    Report Status PENDING  Incomplete      Studies: Dg Chest Port 1 View  Result Date: 02/28/2019 CLINICAL DATA:  Hypoxia, COVID-19 EXAM: PORTABLE CHEST 1 VIEW COMPARISON:  Portable exam 0711 hours compared to 02/10/2019 FINDINGS: Stable heart size mediastinal contours. Patchy airspace infiltrates bilaterally, LEFT greater than RIGHT, consistent with multifocal pneumonia and diagnosis of COVID-19. Infiltrates appear mildly increased since previous exam. No pleural effusion or pneumothorax. Bones demineralized. IMPRESSION: BILATERAL airspace infiltrates consistent with  pneumonia and diagnosis of COVID-19, mildly increased since previous exam. Electronically Signed   By: Lavonia Dana M.D.   On: 02/28/2019 07:38    Scheduled Meds:  amLODipine  10 mg Oral Daily   cholecalciferol  1,000 Units Oral Daily   DULoxetine  30 mg Oral Daily   ferrous sulfate  325 mg Oral BID WC   heparin  5,000 Units Subcutaneous Q8H   insulin aspart  0-9 Units Subcutaneous Q4H   insulin glargine  7 Units Subcutaneous Daily   ipratropium  2 puff Inhalation Q8H   labetalol  10 mg Intravenous Q8H   levalbuterol  2 puff Inhalation Q8H   methylPREDNISolone (SOLU-MEDROL) injection  60 mg Intravenous Q12H   pravastatin  80 mg Oral QHS   vitamin C  1,000 mg Oral Daily   zinc sulfate  220 mg Oral Daily    Continuous Infusions:  azithromycin Stopped (02/28/19 0704)   cefTRIAXone (ROCEPHIN)  IV 1 g (02/28/19 0438)    sodium bicarbonate (isotonic) infusion in sterile water 100 mL/hr at 02/28/19 0433     LOS: 2 days     Kayleen Memos, MD [facility] Pager [phone]  If 7PM-7AM, please contact night-coverage [email or web address] Password [email or web address] 02/28/2019, 7:45 AM

## 2019-02-28 NOTE — Progress Notes (Addendum)
Daily Nursing Note  Received report from miss [place], Therapist, sports. Patient yelling "help me" at the time of assessment. Dr. Nevada Crane had seen and ordered stat ABG, CXR, lasix 34m, and evaluation by critical care. Critical care evaluated patient and deemed that she does not need ICU LOC presently. Able to pass oral medications whole one at tie with verbal prompting. ABG showed hypoxia --> non-rebreather placed at 15LPM with SPO2 at 97-99%. Called patients daughter, SColletta Marylandx4 though unable to get into contact with her. A phone charger obtained for patients telephone to assist her in better communicating with her family members. Patient appears quite delirious in late morning though stable for transfer per medical team. All patient care needs met.   [facility] picked up patient in late morning  Anticipate will need PT/OT for profound debelitation and possible SNF stay upon discharge.   Identified why I was unable to get into contact with patients daughter, SDelma Officer we had the incorrect number. She can be reached at [phone] I have updated this in her contact information as well.

## 2019-02-28 NOTE — Progress Notes (Signed)
Inpatient Diabetes Program Recommendations  AACE/ADA: New Consensus Statement on Inpatient Glycemic Control (2015)  Target Ranges:  Prepandial:   less than 140 mg/dL      Peak postprandial:   less than 180 mg/dL (1-2 hours)      Critically ill patients:  140 - 180 mg/dL   Results for Nichole Franklin, Nichole Franklin (MRN [id number]) as of 02/28/2019 08:40  Ref. Range 02/27/2019 07:44 02/27/2019 12:01 02/27/2019 16:52 02/27/2019 20:25 02/28/2019 00:52 02/28/2019 04:31 02/28/2019 07:19  Glucose-Capillary Latest Ref Range: 70 - 99 mg/dL 284 (H) 231 (H) 225 (H) 238 (H) 253 (H) 284 (H) 277 (H)   Review of Glycemic Control  Diabetes history: DM 2 Outpatient Diabetes medications: 70/30 30 units BID (basal equivalent 42 units, short acting insulin equivalent 18 units), Metformin 500 mg bid  Current orders for Inpatient glycemic control:  Novolog 0-9 units Q4 hours  Inpatient Diabetes Program Recommendations:    Patient on IV Solumedrol 60 mg Q12. Glucose trends still elevated after Lantus 7 units added yesterday. Patient takes 70/30 insulin at home.   Consider increasing Lantus to 12 units (less than 0.2 units/kg).  Thanks,  Tama Headings RN, MSN, BC-ADM Inpatient Diabetes Coordinator Team Pager [phone] (8a-5p)

## 2019-02-28 NOTE — Progress Notes (Signed)
Successfully able to FaceTime with patient's daughter Nichole Franklin and son-in-law, at [phone] while RN in patient's room.  Daughter [name] so thankful for FaceTime call and ability to speak and see her mother.  Family asked excellent questions and understanding of plan of care.  Denied further questions from nursing.

## 2019-02-28 NOTE — Progress Notes (Signed)
Patient received post transfer from [facility].  Patient alert and oriented to self only, confused to place, time, and situation, calling out repeatedly, "help me, help me."  Patient received on non-rebreather mask, Spo2 100%, transitioned to 15L high flow nasal cannula, Spo2 93%, mouth breathing is noted. EMS unsure of patient's baseline mentation.  Foley cath to gravity.  Bed in lowest position, bed alarm on, patient with call bell in hand.      02/28/19 1140  Vitals  Temp 97.7 F (36.5 C)  Temp Source Oral  BP (!) 150/62  MAP (mmHg) 87  BP Location Left Arm  BP Method Automatic  Patient Position (if appropriate) Lying  Pulse Rate 79  Pulse Rate Source Monitor  ECG Heart Rate 79  Cardiac Rhythm NSR  Resp 19  Oxygen Therapy  SpO2 100 %  O2 Device Non-rebreather Mask  Pain Assessment  Pain Scale 0-10  Pain Score 0  MEWS Score  MEWS RR 0  MEWS Pulse 0  MEWS Systolic 0  MEWS LOC 0  MEWS Temp 0  MEWS Score 0  MEWS Score Color Nyoka Cowden

## 2019-02-28 NOTE — Progress Notes (Signed)
Patient alert with noted confusion. She denies pain with assess but moaned out with touch and movement.  B/P remained elevated throughout shift, Hospitalist notified and provided orders for pain and hypertension management. Patient complained of shortness of breath with oxygen saturations 95-100% on 4 Liters.  Patient more fatigued and short of breath at end of shift.  Dr. Nevada Crane notified of changes.  Daughter [name] notified of all changes.  Will continue to monitor.

## 2019-02-28 NOTE — Progress Notes (Signed)
Patient seen this AM  Hemodynamically stable FiO2 requirement about 6 L  Some agitation, confusion  She is hemodynamically stable to be transferred to [facility]  Will need CRRT, not urgent

## 2019-03-01 DIAGNOSIS — E8729 Other acidosis: Secondary | ICD-10-CM

## 2019-03-01 DIAGNOSIS — E872 Acidosis: Secondary | ICD-10-CM

## 2019-03-01 DIAGNOSIS — G9341 Metabolic encephalopathy: Secondary | ICD-10-CM

## 2019-03-01 LAB — CBC WITH DIFFERENTIAL/PLATELET
Abs Immature Granulocytes: 0.39 10*3/uL — ABNORMAL HIGH (ref 0.00–0.07)
Basophils Absolute: 0 10*3/uL (ref 0.0–0.1)
Basophils Relative: 0 %
Eosinophils Absolute: 0 10*3/uL (ref 0.0–0.5)
Eosinophils Relative: 0 %
HCT: 24.7 % — ABNORMAL LOW (ref 36.0–46.0)
Hemoglobin: 8.1 g/dL — ABNORMAL LOW (ref 12.0–15.0)
Immature Granulocytes: 2 %
Lymphocytes Relative: 7 %
Lymphs Abs: 1.4 10*3/uL (ref 0.7–4.0)
MCH: 27.1 pg (ref 26.0–34.0)
MCHC: 32.8 g/dL (ref 30.0–36.0)
MCV: 82.6 fL (ref 80.0–100.0)
Monocytes Absolute: 1.1 10*3/uL — ABNORMAL HIGH (ref 0.1–1.0)
Monocytes Relative: 6 %
Neutro Abs: 16.7 10*3/uL — ABNORMAL HIGH (ref 1.7–7.7)
Neutrophils Relative %: 85 %
Platelets: 273 10*3/uL (ref 150–400)
RBC: 2.99 MIL/uL — ABNORMAL LOW (ref 3.87–5.11)
RDW: 16.1 % — ABNORMAL HIGH (ref 11.5–15.5)
WBC: 19.7 10*3/uL — ABNORMAL HIGH (ref 4.0–10.5)
nRBC: 0.4 % — ABNORMAL HIGH (ref 0.0–0.2)

## 2019-03-01 LAB — COMPREHENSIVE METABOLIC PANEL
ALT: 16 U/L (ref 0–44)
AST: 37 U/L (ref 15–41)
Albumin: 2.7 g/dL — ABNORMAL LOW (ref 3.5–5.0)
Alkaline Phosphatase: 133 U/L — ABNORMAL HIGH (ref 38–126)
Anion gap: 17 — ABNORMAL HIGH (ref 5–15)
BUN: 83 mg/dL — ABNORMAL HIGH (ref 8–23)
CO2: 29 mmol/L (ref 22–32)
Calcium: 6.9 mg/dL — ABNORMAL LOW (ref 8.9–10.3)
Chloride: 98 mmol/L (ref 98–111)
Creatinine, Ser: 4.88 mg/dL — ABNORMAL HIGH (ref 0.44–1.00)
GFR calc Af Amer: 10 mL/min — ABNORMAL LOW (ref >60)
GFR calc non Af Amer: 9 mL/min — ABNORMAL LOW (ref >60)
Glucose, Bld: 197 mg/dL — ABNORMAL HIGH (ref 70–99)
Potassium: 3.2 mmol/L — ABNORMAL LOW (ref 3.5–5.1)
Sodium: 144 mmol/L (ref 135–145)
Total Bilirubin: 0.2 mg/dL — ABNORMAL LOW (ref 0.3–1.2)
Total Protein: 6.9 g/dL (ref 6.5–8.1)

## 2019-03-01 LAB — MAGNESIUM: Magnesium: 2.1 mg/dL (ref 1.7–2.4)

## 2019-03-01 LAB — GLUCOSE, CAPILLARY
Glucose-Capillary: 159 mg/dL — ABNORMAL HIGH (ref 70–99)
Glucose-Capillary: 181 mg/dL — ABNORMAL HIGH (ref 70–99)
Glucose-Capillary: 187 mg/dL — ABNORMAL HIGH (ref 70–99)
Glucose-Capillary: 223 mg/dL — ABNORMAL HIGH (ref 70–99)
Glucose-Capillary: 244 mg/dL — ABNORMAL HIGH (ref 70–99)
Glucose-Capillary: 259 mg/dL — ABNORMAL HIGH (ref 70–99)

## 2019-03-01 LAB — FIBRINOGEN: Fibrinogen: 668 mg/dL — ABNORMAL HIGH (ref 210–475)

## 2019-03-01 LAB — D-DIMER, QUANTITATIVE: D-Dimer, Quant: 7.47 ug/mL-FEU — ABNORMAL HIGH (ref 0.00–0.50)

## 2019-03-01 LAB — SEDIMENTATION RATE: Sed Rate: 132 mm/hr — ABNORMAL HIGH (ref 0–22)

## 2019-03-01 LAB — LACTATE DEHYDROGENASE: LDH: 606 U/L — ABNORMAL HIGH (ref 98–192)

## 2019-03-01 LAB — C-REACTIVE PROTEIN: CRP: 6.5 mg/dL — ABNORMAL HIGH (ref <1.0)

## 2019-03-01 LAB — FERRITIN: Ferritin: 112 ng/mL (ref 11–307)

## 2019-03-01 LAB — PROCALCITONIN: Procalcitonin: 3.36 ng/mL

## 2019-03-01 MED ORDER — SODIUM CHLORIDE 0.9 % IV SOLN
INTRAVENOUS | Status: AC
  Administered 2019-03-01 (×2): via INTRAVENOUS

## 2019-03-01 MED ORDER — AMLODIPINE BESYLATE 5 MG PO TABS
5.0000 mg | ORAL_TABLET | Freq: Every day | ORAL | Status: DC
  Administered 2019-03-01 – 2019-03-03 (×3): 5 mg via ORAL
  Filled 2019-03-01 (×3): qty 1

## 2019-03-01 MED ORDER — POTASSIUM CHLORIDE CRYS ER 20 MEQ PO TBCR
20.0000 meq | EXTENDED_RELEASE_TABLET | Freq: Once | ORAL | Status: AC
  Administered 2019-03-01: 11:00:00 20 meq via ORAL
  Filled 2019-03-01: qty 1

## 2019-03-01 MED ORDER — QUETIAPINE FUMARATE 25 MG PO TABS
25.0000 mg | ORAL_TABLET | Freq: Every evening | ORAL | Status: DC | PRN
  Administered 2019-03-02: 25 mg via ORAL
  Filled 2019-03-01: qty 1

## 2019-03-01 MED ORDER — HALOPERIDOL LACTATE 5 MG/ML IJ SOLN
1.0000 mg | Freq: Four times a day (QID) | INTRAMUSCULAR | Status: DC | PRN
  Administered 2019-03-02 – 2019-03-04 (×2): 1 mg via INTRAVENOUS
  Filled 2019-03-01 (×2): qty 1

## 2019-03-01 NOTE — Care Management Important Message (Signed)
Important Message  Patient Details  Name: Nichole Franklin MRN: [id number] Date of Birth: 1951/08/11   Medicare Important Message Given:  Yes - Important Message mailed due to current National Emergency   Verbal consent obtained due to current National Emergency  Relationship to patient: Child Contact Name: Delma Officer Call Date: 03/01/19  Time: 1520 Phone: [phone] Outcome: Spoke with contact Important Message mailed to: Emergency contact on file      [name] 03/01/2019, 3:21 PM

## 2019-03-01 NOTE — Progress Notes (Signed)
[facility] PROGRESS NOTE    Progress Note  Nichole Franklin  BTD:[id number] DOB: 03-16-1951 DOA: 02/04/2019 PCP: Elwyn Reach, MD     Brief Narrative:   Nichole Franklin is an 68 y.o. female past medical history of diabetes mellitus, hypertension hyperlipidemia has been feeling weak for 3 to 4 days, after being in contact with his sister which was positive SARS-Cov 2.  As then she has progressively felt weak with some diarrhea and shortness of breath.  At [facility] ED was found to be febrile, tachypneic and bilateral infiltrates SARS-CoV-2 was positive.  Found to be in acute renal failure with a creatinine of 7 (baseline 2.6), mild drop in hemoglobin of 3 g compared to 3 years ago.  02/20/2019 SARS-CoV-2 positive 02/27/2019 IV Solu-Medrol 03/01/2019 IV Remdesivir for 5 days  Assessment/Plan:   Acute respiratory failure with hypoxia due to   Pneumonia due to COVID-19 virus: Patient has multiple risk factors including age hypertension diabetes for multiorgan failure and decompensation. Try to keep patient prone for at least 16 hours a day. She is requiring 15 L of high flow nasal cannula to keep saturations greater than 90%. Started on IV Solu-Medrol 02/27/2019, started on IV Remdesivir on 03/01/2019. Cultures not ordered, on admission her procalcitonin was 4.5, significantly elevated white blood cell count, will finish a 7-day course of IV antibiotics.    Acute metabolic encephalopathy likely due to infectious etiology: She seems to fluctuate throughout the day question of underlying undiagnosed dementia with superimposed delirium.  Acute kidney injury on chronic kidney disease stage IV: With a baseline creatinine of 2.6, in the setting of ACE inhibitor and hydrochlorothiazide. Admission 7, she was started on IV fluid isotonic with bicarbonate supplements, discontinue fluids with bicarbonate.  She was also given an IV dose of Lasix. Creatinine is slowly improving, resume normal saline at  75 cc an hour.  Ending today is 4.8.  DM (diabetes mellitus), type 2 with renal complications (HCC) All oral hypoglycemic agents were stopped, on admission her A1c was 9.3. She is on IV steroids which will increase her insulin demand, she was started on long-acting insulin plus sliding scale.  Blood glucose is slowly improving.  Severe high anion gap metabolic acidosis: Her bicarbonate today is 29 anion gap acidosis resolved discontinue bicarbonate infusion. Start her on IV normal saline.  Normocytic anemia Her hemoglobin back in 2018 was 12, on admission is 9.0. She has been on prophylaxis Heparin since 02/12/2019, her hemoglobin has slowly drifted down we will discontinue subcutaneous heparin.  There are no signs of overt bleeding. Place SCDs.  Essential hypertension Hydrochlorothiazide and lisinopril were held on admission, she is started on low-dose oral Norvasc, will allow permissive hypertension with a goal less than 180/90.  Hypomagnesemia: Resolved with oral repletion.   DVT prophylaxis: SCD's Family Communication:none Disposition Plan/Barrier to D/C: home once off oxygen Code Status:     Code Status Orders  (From admission, onward)         Start     Ordered   02/27/19 0034  Full code  Continuous     02/27/19 0034        Code Status History    This patient has a current code status but no historical code status.   Advance Care Planning Activity        IV Access:    Peripheral IV   Procedures and diagnostic studies:   Dg Chest Port 1 View  Result Date: 02/28/2019 CLINICAL DATA:  Hypoxia, COVID-19 EXAM:  PORTABLE CHEST 1 VIEW COMPARISON:  Portable exam 0711 hours compared to 02/05/2019 FINDINGS: Stable heart size mediastinal contours. Patchy airspace infiltrates bilaterally, LEFT greater than RIGHT, consistent with multifocal pneumonia and diagnosis of COVID-19. Infiltrates appear mildly increased since previous exam. No pleural effusion or pneumothorax.  Bones demineralized. IMPRESSION: BILATERAL airspace infiltrates consistent with pneumonia and diagnosis of COVID-19, mildly increased since previous exam. Electronically Signed   By: Lavonia Dana M.D.   On: 02/28/2019 07:38     Medical Consultants:    None.  Anti-Infectives:   IV Rocephin and azithromycin  Subjective:    Nichole Franklin has no new complaints today moaning and groaning, as per nurse the patient is mentation is fluctuating throughout the day  Objective:    Vitals:   02/28/19 1627 02/28/19 1700 02/28/19 1954 03/01/19 0430  BP: (!) 150/72 (!) 153/75 (!) 157/71 (!) 166/78  Pulse: 78 76 80 78  Resp: (!) 21 19 17 20   Temp: 98.1 F (36.7 C)  97.6 F (36.4 C) (!) 97.5 F (36.4 C)  TempSrc: Axillary  Oral Oral  SpO2: (!) 85% 97% 96% 98%  Weight:    126.4 kg  Height:        Intake/Output Summary (Last 24 hours) at 03/01/2019 0658 Last data filed at 03/01/2019 0431 Gross per 24 hour  Intake 360 ml  Output 3850 ml  Net -3490 ml   Filed Weights   02/27/2019 2211 02/27/19 0500 03/01/19 0430  Weight: 76.7 kg 76.7 kg 126.4 kg    Exam: General exam: In no acute distress. Respiratory system: Good air movement and diffuse crackles at bases bilaterally. Cardiovascular system: S1 & S2 heard, RRR. No JVD. Gastrointestinal system: Abdomen is nondistended, soft and nontender.  Central nervous system: Awake alert and oriented x1 no focal deficits Extremities: No pedal edema. Skin: No rashes, lesions or ulcers    Data Reviewed:    Labs: Basic Metabolic Panel: Recent Labs  Lab 02/21/2019 1247 02/27/19 0215 02/28/19 0512  NA 138 138 140  K 4.7 4.9 3.8  CL 104 103 106  CO2 13* 8* 16*  GLUCOSE 144* 294* 312*  BUN 81* 80* 81*  CREATININE 7.09* 6.85* 5.87*  CALCIUM 7.7* 7.2* 6.9*  MG  --   --  1.4*   GFR Estimated Creatinine Clearance: 12.5 mL/min (A) (by C-G formula based on SCr of 5.87 mg/dL (H)). Liver Function Tests: Recent Labs  Lab 02/20/2019 1247 02/27/19  0215 02/28/19 0512  AST 54* 48* 41  ALT 20 21 18   ALKPHOS 178* 164* 140*  BILITOT 1.1 1.0 0.9  PROT 8.0 7.4 6.8  ALBUMIN 3.0* 2.6* 2.4*   No results for input(s): LIPASE, AMYLASE in the last 168 hours. No results for input(s): AMMONIA in the last 168 hours. Coagulation profile No results for input(s): INR, PROTIME in the last 168 hours. COVID-19 Labs  Recent Labs    01/31/2019 1553 02/27/19 0215 02/28/19 0512  DDIMER  --   --  9.32*  FERRITIN 192 244 138  LDH  --  502* 570*  CRP 22.4* 26.9* 12.2*    Lab Results  Component Value Date   SARSCOV2NAA POSITIVE (A) 02/13/2019    CBC: Recent Labs  Lab 02/24/2019 1247 02/27/19 0215 02/28/19 0512 03/01/19 0413  WBC 19.0* 22.5* 24.3* 19.7*  NEUTROABS 15.1* 20.5* 20.6* 16.7*  HGB 9.0* 9.1* 8.7* 8.1*  HCT 28.8* 29.4* 25.9* 24.7*  MCV 86.2 87.2 80.9 82.6  PLT 289 315 329 273   Cardiac Enzymes:  No results for input(s): CKTOTAL, CKMB, CKMBINDEX, TROPONINI in the last 168 hours. BNP (last 3 results) No results for input(s): PROBNP in the last 8760 hours. CBG: Recent Labs  Lab 02/28/19 1625 02/28/19 2043 02/28/19 2122 02/28/19 2348 03/01/19 0437  GLUCAP 269* 242* 249* 244* 181*   D-Dimer: Recent Labs    02/28/19 0512  DDIMER 9.32*   Hgb A1c: Recent Labs    02/27/19 0215  HGBA1C 9.3*   Lipid Profile: No results for input(s): CHOL, HDL, LDLCALC, TRIG, CHOLHDL, LDLDIRECT in the last 72 hours. Thyroid function studies: No results for input(s): TSH, T4TOTAL, T3FREE, THYROIDAB in the last 72 hours.  Invalid input(s): FREET3 Anemia work up: Recent Labs    02/27/19 0215 02/28/19 0512  VITAMINB12 666  --   FOLATE 15.1  --   FERRITIN 244 138  TIBC 350  --   IRON 14*  --   RETICCTPCT 0.5  --    Sepsis Labs: Recent Labs  Lab 01/31/2019 1247 01/31/2019 1553 02/27/19 0215 02/28/19 0512 03/01/19 0413  PROCALCITON 4.54  --   --   --   --   WBC 19.0*  --  22.5* 24.3* 19.7*  LATICACIDVEN  --  1.7  --   --   --     Microbiology Recent Results (from the past 240 hour(s))  SARS Coronavirus 2 (CEPHEID- Performed in [place] hospital lab), Hosp Order     Status: Abnormal   Collection Time: 02/22/2019 12:47 PM   Specimen: Nasopharyngeal Swab  Result Value Ref Range Status   SARS Coronavirus 2 POSITIVE (A) NEGATIVE Final    Comment: RESULT CALLED TO, READ BACK BY AND VERIFIED WITH: KIM MAIN @1410  ON 02/11/2019 BY FMW (NOTE) If result is NEGATIVE SARS-CoV-2 target nucleic acids are NOT DETECTED. The SARS-CoV-2 RNA is generally detectable in upper and lower  respiratory specimens during the acute phase of infection. The lowest  concentration of SARS-CoV-2 viral copies this assay can detect is 250  copies / mL. A negative result does not preclude SARS-CoV-2 infection  and should not be used as the sole basis for treatment or other  patient management decisions.  A negative result may occur with  improper specimen collection / handling, submission of specimen other  than nasopharyngeal swab, presence of viral mutation(s) within the  areas targeted by this assay, and inadequate number of viral copies  (<250 copies / mL). A negative result must be combined with clinical  observations, patient history, and epidemiological information. If result is POSITIVE SARS-CoV-2 target nucleic acids are DETECTED. T he SARS-CoV-2 RNA is generally detectable in upper and lower  respiratory specimens during the acute phase of infection.  Positive  results are indicative of active infection with SARS-CoV-2.  Clinical  correlation with patient history and other diagnostic information is  necessary to determine patient infection status.  Positive results do  not rule out bacterial infection or co-infection with other viruses. If result is PRESUMPTIVE POSTIVE SARS-CoV-2 nucleic acids MAY BE PRESENT.   A presumptive positive result was obtained on the submitted specimen  and confirmed on repeat testing.  While 2019  novel coronavirus  (SARS-CoV-2) nucleic acids may be present in the submitted sample  additional confirmatory testing may be necessary for epidemiological  and / or clinical management purposes  to differentiate between  SARS-CoV-2 and other Sarbecovirus currently known to infect humans.  If clinically indicated additional testing with an alternate test  methodology [phone]) is  advised. The SARS-CoV-2 RNA is  generally  detectable in upper and lower respiratory specimens during the acute  phase of infection. The expected result is Negative. Fact Sheet for Patients:  StrictlyIdeas.no Fact Sheet for Healthcare Providers: BankingDealers.co.za This test is not yet approved or cleared by the [place] FDA and has been authorized for detection and/or diagnosis of SARS-CoV-2 by FDA under an Emergency Use Authorization (EUA).  This EUA will remain in effect (meaning this test can be used) for the duration of the COVID-19 declaration under Section 564(b)(1) of the Act, 21 U.S.C. section 360bbb-3(b)(1), unless the authorization is terminated or revoked sooner. Performed at [facility], [address] [place], [place] [address]   Culture, blood (Routine X 2) w Reflex to ID Panel     Status: None (Preliminary result)   Collection Time: 02/14/2019  3:54 PM   Specimen: BLOOD  Result Value Ref Range Status   Specimen Description BLOOD BLOOD LEFT FOREARM  Final   Special Requests   Final    BOTTLES DRAWN AEROBIC AND ANAEROBIC Blood Culture results may not be optimal due to an excessive volume of blood received in culture bottles   Culture   Final    NO GROWTH 2 DAYS Performed at [facility], [address] [place], [place] [address]    Report Status PENDING  Incomplete  Culture, blood (Routine X 2) w Reflex to ID Panel     Status: None (Preliminary result)   Collection Time: 02/27/2019  3:54 PM   Specimen: BLOOD  Result  Value Ref Range Status   Specimen Description BLOOD LEFT ANTECUBITAL  Final   Special Requests   Final    BOTTLES DRAWN AEROBIC AND ANAEROBIC Blood Culture results may not be optimal due to an excessive volume of blood received in culture bottles   Culture   Final    NO GROWTH 2 DAYS Performed at [facility], [address] [place], [place] [address]    Report Status PENDING  Incomplete     Medications:   . amLODipine  10 mg Oral Daily  . cholecalciferol  1,000 Units Oral Daily  . DULoxetine  30 mg Oral Daily  . ferrous sulfate  325 mg Oral BID WC  . heparin  5,000 Units Subcutaneous Q8H  . insulin aspart  0-9 Units Subcutaneous Q4H  . insulin glargine  7 Units Subcutaneous Daily  . ipratropium  2 puff Inhalation Q8H  . labetalol  10 mg Intravenous Q8H  . levalbuterol  2 puff Inhalation Q8H  . methylPREDNISolone (SOLU-MEDROL) injection  60 mg Intravenous Q12H  . pravastatin  80 mg Oral QHS  . vitamin C  1,000 mg Oral Daily  . zinc sulfate  220 mg Oral Daily   Continuous Infusions: . azithromycin 500 mg (02/28/19 2126)  . cefTRIAXone (ROCEPHIN)  IV 1 g (03/01/19 0527)  .  sodium bicarbonate (isotonic) infusion in sterile water 100 mL/hr at 02/28/19 1451      LOS: 3 days   Charlynne Cousins  [facility]  03/01/2019, 6:58 AM

## 2019-03-01 NOTE — Progress Notes (Signed)
Completed FaceTime call with daughter, Nichole Franklin, at 14:30 per daughter's request ealier this morning.  Patient remains very drowsy and disoriented to time, place, and situation.  Patient continues to speak in rambling, inappropriate to situation, short sentences.  This afternoon she is repeating, "Yes, Jesus, yes! Help me Jesus, help me."  Patient remains with strong grip bilaterally and able to follow basic commands for nursing, but verbally remains inappropriate and was not conversing back with daughter Nichole Franklin, despite daughters plea.  Daughter became very upset and asked "are her pupils dilated? ... I need to be there with her.... this just isn't right... I will come there to be with her."  Nursing offered emotional support and reminded daughter that hemodynamically her mother is stable, maintaining Spo2 93% on 15L HFNC, her breathing is unlabored, patient's labs are slightly improved today, but that patient remains acutely ill, with multiple A/C conditions.  Daughter refused to listen to encouragement further and said, "I will be coming there to be with her."  Nursing said that she would call the physician, Dr. Venetia Constable, so that he can provide further details regarding neurological status and would also update Charge Nurse on family wishes and concerns.

## 2019-03-01 NOTE — Progress Notes (Signed)
Patient is more alert at present time and is agreeable to laying on her stomach.  Patient educated on importance of prone position and doctor's order to prone up to 16 hours daily, or as much as patient can tolerate.  Patient nodded yes  appropriately and assisted nursing with 25-50% with repositioning from supine to prone position and tolerated this well.    In prone position able to titrate oxygen down from 15 HFNC --> 12 --> 10--> 8L HFNC.    SPo2 presently 98% on 8L HFNC, patient in prone position and reports she is comfortable.  Will continue to monitor patient closely.

## 2019-03-01 DEATH — deceased

## 2019-03-02 LAB — CBC WITH DIFFERENTIAL/PLATELET
Abs Immature Granulocytes: 0.65 10*3/uL — ABNORMAL HIGH (ref 0.00–0.07)
Basophils Absolute: 0 10*3/uL (ref 0.0–0.1)
Basophils Relative: 0 %
Eosinophils Absolute: 0 10*3/uL (ref 0.0–0.5)
Eosinophils Relative: 0 %
HCT: 25.3 % — ABNORMAL LOW (ref 36.0–46.0)
Hemoglobin: 8.1 g/dL — ABNORMAL LOW (ref 12.0–15.0)
Immature Granulocytes: 4 %
Lymphocytes Relative: 9 %
Lymphs Abs: 1.8 10*3/uL (ref 0.7–4.0)
MCH: 27.2 pg (ref 26.0–34.0)
MCHC: 32 g/dL (ref 30.0–36.0)
MCV: 84.9 fL (ref 80.0–100.0)
Monocytes Absolute: 1.8 10*3/uL — ABNORMAL HIGH (ref 0.1–1.0)
Monocytes Relative: 9 %
Neutro Abs: 14.5 10*3/uL — ABNORMAL HIGH (ref 1.7–7.7)
Neutrophils Relative %: 78 %
Platelets: 256 10*3/uL (ref 150–400)
RBC: 2.98 MIL/uL — ABNORMAL LOW (ref 3.87–5.11)
RDW: 15.9 % — ABNORMAL HIGH (ref 11.5–15.5)
WBC: 18.7 10*3/uL — ABNORMAL HIGH (ref 4.0–10.5)
nRBC: 0.6 % — ABNORMAL HIGH (ref 0.0–0.2)

## 2019-03-02 LAB — COMPREHENSIVE METABOLIC PANEL
ALT: 19 U/L (ref 0–44)
AST: 43 U/L — ABNORMAL HIGH (ref 15–41)
Albumin: 2.7 g/dL — ABNORMAL LOW (ref 3.5–5.0)
Alkaline Phosphatase: 133 U/L — ABNORMAL HIGH (ref 38–126)
Anion gap: 19 — ABNORMAL HIGH (ref 5–15)
BUN: 81 mg/dL — ABNORMAL HIGH (ref 8–23)
CO2: 27 mmol/L (ref 22–32)
Calcium: 6.8 mg/dL — ABNORMAL LOW (ref 8.9–10.3)
Chloride: 100 mmol/L (ref 98–111)
Creatinine, Ser: 4.14 mg/dL — ABNORMAL HIGH (ref 0.44–1.00)
GFR calc Af Amer: 12 mL/min — ABNORMAL LOW (ref >60)
GFR calc non Af Amer: 10 mL/min — ABNORMAL LOW (ref >60)
Glucose, Bld: 269 mg/dL — ABNORMAL HIGH (ref 70–99)
Potassium: 3 mmol/L — ABNORMAL LOW (ref 3.5–5.1)
Sodium: 146 mmol/L — ABNORMAL HIGH (ref 135–145)
Total Bilirubin: 0.4 mg/dL (ref 0.3–1.2)
Total Protein: 6.9 g/dL (ref 6.5–8.1)

## 2019-03-02 LAB — GLUCOSE, CAPILLARY
Glucose-Capillary: 227 mg/dL — ABNORMAL HIGH (ref 70–99)
Glucose-Capillary: 241 mg/dL — ABNORMAL HIGH (ref 70–99)
Glucose-Capillary: 266 mg/dL — ABNORMAL HIGH (ref 70–99)
Glucose-Capillary: 267 mg/dL — ABNORMAL HIGH (ref 70–99)
Glucose-Capillary: 269 mg/dL — ABNORMAL HIGH (ref 70–99)
Glucose-Capillary: 284 mg/dL — ABNORMAL HIGH (ref 70–99)
Glucose-Capillary: 295 mg/dL — ABNORMAL HIGH (ref 70–99)

## 2019-03-02 LAB — MAGNESIUM: Magnesium: 2 mg/dL (ref 1.7–2.4)

## 2019-03-02 LAB — BASIC METABOLIC PANEL
Anion gap: 15 (ref 5–15)
BUN: 81 mg/dL — ABNORMAL HIGH (ref 8–23)
CO2: 29 mmol/L (ref 22–32)
Calcium: 6.9 mg/dL — ABNORMAL LOW (ref 8.9–10.3)
Chloride: 102 mmol/L (ref 98–111)
Creatinine, Ser: 3.91 mg/dL — ABNORMAL HIGH (ref 0.44–1.00)
GFR calc Af Amer: 13 mL/min — ABNORMAL LOW (ref >60)
GFR calc non Af Amer: 11 mL/min — ABNORMAL LOW (ref >60)
Glucose, Bld: 289 mg/dL — ABNORMAL HIGH (ref 70–99)
Potassium: 3.1 mmol/L — ABNORMAL LOW (ref 3.5–5.1)
Sodium: 146 mmol/L — ABNORMAL HIGH (ref 135–145)

## 2019-03-02 LAB — FERRITIN: Ferritin: 122 ng/mL (ref 11–307)

## 2019-03-02 LAB — C-REACTIVE PROTEIN: CRP: 3.3 mg/dL — ABNORMAL HIGH (ref <1.0)

## 2019-03-02 LAB — FIBRINOGEN: Fibrinogen: 585 mg/dL — ABNORMAL HIGH (ref 210–475)

## 2019-03-02 LAB — D-DIMER, QUANTITATIVE: D-Dimer, Quant: 7.65 ug/mL-FEU — ABNORMAL HIGH (ref 0.00–0.50)

## 2019-03-02 LAB — LACTATE DEHYDROGENASE: LDH: 710 U/L — ABNORMAL HIGH (ref 98–192)

## 2019-03-02 MED ORDER — INSULIN GLARGINE 100 UNIT/ML ~~LOC~~ SOLN
7.0000 [IU] | Freq: Two times a day (BID) | SUBCUTANEOUS | Status: DC
  Administered 2019-03-02 – 2019-03-03 (×3): 7 [IU] via SUBCUTANEOUS
  Filled 2019-03-02 (×4): qty 0.07

## 2019-03-02 MED ORDER — SODIUM CHLORIDE 0.9 % IV SOLN: INTRAVENOUS | Status: AC

## 2019-03-02 MED ORDER — POTASSIUM CHLORIDE CRYS ER 20 MEQ PO TBCR
40.0000 meq | EXTENDED_RELEASE_TABLET | Freq: Three times a day (TID) | ORAL | Status: AC
  Administered 2019-03-02 (×3): 40 meq via ORAL
  Filled 2019-03-02 (×3): qty 2

## 2019-03-02 MED ORDER — MAGNESIUM OXIDE 400 (241.3 MG) MG PO TABS
400.0000 mg | ORAL_TABLET | Freq: Two times a day (BID) | ORAL | Status: AC
  Administered 2019-03-02 (×2): 400 mg via ORAL
  Filled 2019-03-02 (×2): qty 1

## 2019-03-02 NOTE — Progress Notes (Signed)
SLP Cancellation Note  Patient Details Name: Nichole Franklin MRN: [id number] DOB: Nov 17, 1950   Cancelled treatment:       Reason Eval/Treat Not Completed: Patient's level of consciousness. Attempted to roll pt to completed swallow eval, pt dramatically desatted to 60. RN immediate arrived to assist back to prone with rapid recovery to 90s/100. Pt initially obtunded but arousal improved with RN intervention. She reports pt was able to tolerate POs well yesterday when she was awake/alert without signs of aspiration, but she was sedated overnight and mental status has not recovered today yet. She is monitoring pt closely and will attempt sips and meds when pt is alert and able to sit upright. Will plan to f/u tomorrow.   Herbie Baltimore, MA CCC-SLP  Acute [facility] Services Pager [phone] Office [phone]  Lynann Beaver 03/02/2019, 11:47 AM

## 2019-03-02 NOTE — Progress Notes (Signed)
Nurse received phone call from patient's daughter Delma Officer whom immediately asked if she could, "please apologize for my behavior yesterday and how I spoke to you."  Nurse empathetically listened to pt's daughter and thanked her for her sincerity, but assured that offense was not taken and that nurse, along with Dr. Venetia Constable, continues to strive only to provide the best nursing and medical care to patient, advocating, and supporting her recovery to the best of their abilities.    Patient's daughter reported that her mother has lived with her since 2005 and that they are very close.  [name] reported speaking her siblings and they are collectively thankful for the care their mother is receiving and realize that she is very sick.  [name] asked very good and thoughtful questions.  Current plan of care, lab work, mental status, latest vital signs and oxygen requirements discussed.  Patient's daughter denied further questions and requested if FaceTime call this afternoon would be possible, she would prefer before 1500.

## 2019-03-02 NOTE — Progress Notes (Signed)
Spoke with patient's son for approximately 30 min.  Explained to him details of his mother's care and explained to him the condition of his mother. Son sounded very appreciative of information given to him.  He stated that he would call and update his sister. Also explained to son that he could Facetime his mother if he wanted to.
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
## 2019-03-02 NOTE — Progress Notes (Signed)
[facility] PROGRESS NOTE    Progress Note  Nichole Franklin  AST:[id number] DOB: [birth year] DOA: 01/31/2019 PCP: Elwyn Reach, MD     Brief Narrative:   Nichole Franklin is an 68 y.o. female past medical history of diabetes mellitus, hypertension hyperlipidemia has been feeling weak for 3 to 4 days, after being in contact with his sister which was positive SARS-Cov 2.  As then she has progressively felt weak with some diarrhea and shortness of breath.  At [facility] ED was found to be febrile, tachypneic and bilateral infiltrates SARS-CoV-2 was positive.  Found to be in acute renal failure with a creatinine of 7 (baseline 2.6), mild drop in hemoglobin of 3 g compared to 3 years ago.  02/28/2019 SARS-CoV-2 positive 02/27/2019 IV Solu-Medrol 03/01/2019 IV Remdesivir for 5 days  Assessment/Plan:   Acute respiratory failure with hypoxia due to   Pneumonia due to COVID-19 virus: Patient has multiple risk factors including age hypertension diabetes for multiorgan failure and decompensation. Try to keep patient prone for at least 16 hours a day. Her oxygen requirements have improved with Proning she is currently on 5-4 L satting greater than 94%. Continue IV Solu-Medrol and IV Remdesivir. She has remained afebrile We will complete a 7-day course of IV antibiotics, as on admission she had an elevated white count and fever and a procalcitonin of 4.5, cultures not ordered on admission.  Acute metabolic encephalopathy/acute confusional state likely due to infectious etiology: She seems to fluctuate throughout the day question of underlying undiagnosed dementia with superimposed delirium. This morning she is improved more calm, she was able to answer yes and no questions, it is to note that she received Haldol like at 3 AM this morning. We will try to keep the blinds open in the morning and close them at night. Once her infection improves and she goes back home her delirium will start to  improve.  Acute kidney injury on chronic kidney disease stage IV: With a baseline creatinine of 2.6, in the setting of ACE inhibitor and hydrochlorothiazide. Admission 7, likely hemodynamically mediated, creatinine is slowly improving, will continue IV fluids for additional 12 hours recheck a basic metabolic panel tomorrow morning.  Today her creatinine is 4  DM (diabetes mellitus), type 2 with renal complications (HCC) All oral hypoglycemic agents were stopped, on admission her A1c was 9.3. She is on IV steroids which will increase her insulin demand. Glucose continues to be high now that the patient is more stable, will give her carb modified diet and have a swallowing evaluation. We will increase her long-acting insulin. Swallowing evaluation is pending.  Severe high anion gap metabolic acidosis: Likely due to renal disease now resolved.  Normocytic anemia Her hemoglobin back in 2018 was 12, on admission is 9.0. She has been on prophylaxis Heparin since 02/16/2019, her hemoglobin has slowly drifted down we will discontinue subcutaneous heparin.  There are no signs of overt bleeding. Continue SCDs  Essential hypertension Hydrochlorothiazide and lisinopril were held on admission, she was started on low-dose oral Norvasc, will allow permissive hypertension with a goal less than 180/90.  Hypokalemia: Multifactorial due to acidosis and d AKI repleted orally rech in am.  Hypomagnesemia: Cont oral repletion, it is trending down. Replete orally recheck in am.  Prolong Qtc: Potasium is low replete orally, also replete Mag. Will try to keep Potasium > 4.0 and Mag > 2.0. Repeat a basic metabolic panel this afternoon.   DVT prophylaxis: SCD's Family Communication:none Disposition Plan/Barrier to D/C: home  once off oxygen Code Status:     Code Status Orders  (From admission, onward)         Start     Ordered   02/27/19 0034  Full code  Continuous     02/27/19 0034         Code Status History    This patient has a current code status but no historical code status.   Advance Care Planning Activity        IV Access:    Peripheral IV   Procedures and diagnostic studies:   Dg Chest Port 1 View  Result Date: 02/28/2019 CLINICAL DATA:  Hypoxia, COVID-19 EXAM: PORTABLE CHEST 1 VIEW COMPARISON:  Portable exam 0711 hours compared to 02/21/2019 FINDINGS: Stable heart size mediastinal contours. Patchy airspace infiltrates bilaterally, LEFT greater than RIGHT, consistent with multifocal pneumonia and diagnosis of COVID-19. Infiltrates appear mildly increased since previous exam. No pleural effusion or pneumothorax. Bones demineralized. IMPRESSION: BILATERAL airspace infiltrates consistent with pneumonia and diagnosis of COVID-19, mildly increased since previous exam. Electronically Signed   By: Lavonia Dana M.D.   On: 02/28/2019 07:38     Medical Consultants:    None.  Anti-Infectives:   IV Rocephin and azithromycin  Subjective:    Nichole Franklin has little bit sedated this morning but has no new complaints, she did relay she was hungry this morning.  Objective:    Vitals:   03/01/19 1830 03/01/19 1833 03/01/19 2026 03/02/19 0438  BP:   135/75 (!) 166/53  Pulse: 83 83    Resp: 15 13  18   Temp:   98.6 F (37 C) 98.4 F (36.9 C)  TempSrc:   Oral Oral  SpO2: (!) 78% 95%    Weight:      Height:        Intake/Output Summary (Last 24 hours) at 03/02/2019 0732 Last data filed at 03/02/2019 0700 Gross per 24 hour  Intake 634.96 ml  Output 3750 ml  Net -3115.04 ml   Filed Weights   02/28/2019 2211 02/27/19 0500 03/01/19 0430  Weight: 76.7 kg 76.7 kg 126.4 kg    Exam: General exam: In no acute distress. Respiratory system: Good air movement and diffuse crackles at bases Cardiovascular system: S1 & S2 heard, RRR. No JVD. Gastrointestinal system: Abdomen is nondistended, soft and nontender.  Central nervous system: Alert and oriented. No focal  neurological deficits. Extremities: No pedal edema. Skin: No rashes, lesions or ulcers     Data Reviewed:    Labs: Basic Metabolic Panel: Recent Labs  Lab 02/05/2019 1247 02/27/19 0215 02/28/19 0512 03/01/19 0413  NA 138 138 140 144  K 4.7 4.9 3.8 3.2*  CL 104 103 106 98  CO2 13* 8* 16* 29  GLUCOSE 144* 294* 312* 197*  BUN 81* 80* 81* 83*  CREATININE 7.09* 6.85* 5.87* 4.88*  CALCIUM 7.7* 7.2* 6.9* 6.9*  MG  --   --  1.4* 2.1   GFR Estimated Creatinine Clearance: 15 mL/min (A) (by C-G formula based on SCr of 4.88 mg/dL (H)). Liver Function Tests: Recent Labs  Lab 02/09/2019 1247 02/27/19 0215 02/28/19 0512 03/01/19 0413  AST 54* 48* 41 37  ALT 20 21 18 16   ALKPHOS 178* 164* 140* 133*  BILITOT 1.1 1.0 0.9 0.2*  PROT 8.0 7.4 6.8 6.9  ALBUMIN 3.0* 2.6* 2.4* 2.7*   No results for input(s): LIPASE, AMYLASE in the last 168 hours. No results for input(s): AMMONIA in the last 168 hours. Coagulation profile No  results for input(s): INR, PROTIME in the last 168 hours. COVID-19 Labs  Recent Labs    02/28/19 0512 03/01/19 0413  DDIMER 9.32* 7.47*  FERRITIN 138 112  LDH 570* 606*  CRP 12.2* 6.5*    Lab Results  Component Value Date   SARSCOV2NAA POSITIVE (A) 02/15/2019    CBC: Recent Labs  Lab 02/03/2019 1247 02/27/19 0215 02/28/19 0512 03/01/19 0413 03/02/19 0300  WBC 19.0* 22.5* 24.3* 19.7* 18.7*  NEUTROABS 15.1* 20.5* 20.6* 16.7* 14.5*  HGB 9.0* 9.1* 8.7* 8.1* 8.1*  HCT 28.8* 29.4* 25.9* 24.7* 25.3*  MCV 86.2 87.2 80.9 82.6 84.9  PLT 289 315 329 273 256   Cardiac Enzymes: No results for input(s): CKTOTAL, CKMB, CKMBINDEX, TROPONINI in the last 168 hours. BNP (last 3 results) No results for input(s): PROBNP in the last 8760 hours. CBG: Recent Labs  Lab 03/01/19 1159 03/01/19 1641 03/01/19 2111 03/02/19 0018 03/02/19 0433  GLUCAP 187* 223* 259* 284* 269*   D-Dimer: Recent Labs    02/28/19 0512 03/01/19 0413  DDIMER 9.32* 7.47*   Hgb  A1c: No results for input(s): HGBA1C in the last 72 hours. Lipid Profile: No results for input(s): CHOL, HDL, LDLCALC, TRIG, CHOLHDL, LDLDIRECT in the last 72 hours. Thyroid function studies: No results for input(s): TSH, T4TOTAL, T3FREE, THYROIDAB in the last 72 hours.  Invalid input(s): FREET3 Anemia work up: Recent Labs    02/28/19 0512 03/01/19 0413  FERRITIN 138 112   Sepsis Labs: Recent Labs  Lab 02/22/2019 1247 02/20/2019 1553 02/27/19 0215 02/28/19 0512 03/01/19 0413 03/02/19 0300  PROCALCITON 4.54  --   --   --  3.36  --   WBC 19.0*  --  22.5* 24.3* 19.7* 18.7*  LATICACIDVEN  --  1.7  --   --   --   --    Microbiology Recent Results (from the past 240 hour(s))  SARS Coronavirus 2 (CEPHEID- Performed in [place] hospital lab), Hosp Order     Status: Abnormal   Collection Time: 02/10/2019 12:47 PM   Specimen: Nasopharyngeal Swab  Result Value Ref Range Status   SARS Coronavirus 2 POSITIVE (A) NEGATIVE Final    Comment: RESULT CALLED TO, READ BACK BY AND VERIFIED WITH: KIM MAIN @1410  ON 02/22/2019 BY FMW (NOTE) If result is NEGATIVE SARS-CoV-2 target nucleic acids are NOT DETECTED. The SARS-CoV-2 RNA is generally detectable in upper and lower  respiratory specimens during the acute phase of infection. The lowest  concentration of SARS-CoV-2 viral copies this assay can detect is 250  copies / mL. A negative result does not preclude SARS-CoV-2 infection  and should not be used as the sole basis for treatment or other  patient management decisions.  A negative result may occur with  improper specimen collection / handling, submission of specimen other  than nasopharyngeal swab, presence of viral mutation(s) within the  areas targeted by this assay, and inadequate number of viral copies  (<250 copies / mL). A negative result must be combined with clinical  observations, patient history, and epidemiological information. If result is POSITIVE SARS-CoV-2 target nucleic  acids are DETECTED. T he SARS-CoV-2 RNA is generally detectable in upper and lower  respiratory specimens during the acute phase of infection.  Positive  results are indicative of active infection with SARS-CoV-2.  Clinical  correlation with patient history and other diagnostic information is  necessary to determine patient infection status.  Positive results do  not rule out bacterial infection or co-infection with other viruses.  If result is PRESUMPTIVE POSTIVE SARS-CoV-2 nucleic acids MAY BE PRESENT.   A presumptive positive result was obtained on the submitted specimen  and confirmed on repeat testing.  While 2019 novel coronavirus  (SARS-CoV-2) nucleic acids may be present in the submitted sample  additional confirmatory testing may be necessary for epidemiological  and / or clinical management purposes  to differentiate between  SARS-CoV-2 and other Sarbecovirus currently known to infect humans.  If clinically indicated additional testing with an alternate test  methodology [phone]) is  advised. The SARS-CoV-2 RNA is generally  detectable in upper and lower respiratory specimens during the acute  phase of infection. The expected result is Negative. Fact Sheet for Patients:  StrictlyIdeas.no Fact Sheet for Healthcare Providers: BankingDealers.co.za This test is not yet approved or cleared by the [place] FDA and has been authorized for detection and/or diagnosis of SARS-CoV-2 by FDA under an Emergency Use Authorization (EUA).  This EUA will remain in effect (meaning this test can be used) for the duration of the COVID-19 declaration under Section 564(b)(1) of the Act, 21 U.S.C. section 360bbb-3(b)(1), unless the authorization is terminated or revoked sooner. Performed at [facility], [address] [place], [place] [address]   Culture, blood (Routine X 2) w Reflex to ID Panel     Status: None (Preliminary result)    Collection Time: 02/02/2019  3:54 PM   Specimen: BLOOD  Result Value Ref Range Status   Specimen Description BLOOD BLOOD LEFT FOREARM  Final   Special Requests   Final    BOTTLES DRAWN AEROBIC AND ANAEROBIC Blood Culture results may not be optimal due to an excessive volume of blood received in culture bottles   Culture   Final    NO GROWTH 4 DAYS Performed at [facility], [address], [place], [place] [address]    Report Status PENDING  Incomplete  Culture, blood (Routine X 2) w Reflex to ID Panel     Status: None (Preliminary result)   Collection Time: 02/04/2019  3:54 PM   Specimen: BLOOD  Result Value Ref Range Status   Specimen Description BLOOD LEFT ANTECUBITAL  Final   Special Requests   Final    BOTTLES DRAWN AEROBIC AND ANAEROBIC Blood Culture results may not be optimal due to an excessive volume of blood received in culture bottles   Culture   Final    NO GROWTH 4 DAYS Performed at [facility], [address] [place], [place] [address]    Report Status PENDING  Incomplete     Medications:    amLODipine  5 mg Oral Daily   cholecalciferol  1,000 Units Oral Daily   DULoxetine  30 mg Oral Daily   ferrous sulfate  325 mg Oral BID WC   insulin aspart  0-9 Units Subcutaneous Q4H   insulin glargine  7 Units Subcutaneous Daily   ipratropium  2 puff Inhalation Q8H   labetalol  10 mg Intravenous Q8H   levalbuterol  2 puff Inhalation Q8H   methylPREDNISolone (SOLU-MEDROL) injection  60 mg Intravenous Q12H   pravastatin  80 mg Oral QHS   vitamin C  1,000 mg Oral Daily   zinc sulfate  220 mg Oral Daily   Continuous Infusions:  sodium chloride 75 mL/hr at 03/01/19 1830   azithromycin 500 mg (03/01/19 2038)   cefTRIAXone (ROCEPHIN)  IV 1 g (03/02/19 0517)      LOS: 4 days   Charlynne Cousins  [facility]  03/02/2019, 7:32 AM

## 2019-03-02 NOTE — Progress Notes (Signed)
Nursing received text message from patient's daughter Nichole Franklin requesting today that this writer call the patient's niece, Nichole Franklin at [phone] to [place] call with niece and the patient's elderly sister.    16:15 -- Successful FaceTime call with both patient's niece and sister, whom were equally thankful for nurse call. Patient was more alert today on family call and seen smiling much of the call.  Patient remains neurology delayed in responses, but was able to speak a few short sentences to her family, including, " Hi Nichole Franklin" ... "I love you," said multiple times... and lastly "goodbye," prior to ending phone call.  Family denies further questions or concerns and ended phone call with nursing saying, "God Bless you and thank you for everything you are doing."

## 2019-03-02 NOTE — Progress Notes (Signed)
Made contact with patient daughter Delma Officer [phone] to offer support.  [name] reports to being in a good place today related to the care her mother is receiving at [facility].  She reports that she has had time to discuss things with her brother and that he has helped her.  [name] apologized for her behavior the day before and that she was in a better place today.  She did make request that her brothers name be added to the emergency contacts as a person we could speak to.    We made a plan for her to remain the contact person for her mother to discuss plan of care with.  I requested she partner with [place] to let her family and siblings know she would be this person to call and receive updates and then let her share these updates with the family. [name] agreed to this plan.    Richardean Canal RN, BSN, CCRN

## 2019-03-03 LAB — CBC
HCT: 25.9 % — ABNORMAL LOW (ref 36.0–46.0)
Hemoglobin: 8 g/dL — ABNORMAL LOW (ref 12.0–15.0)
MCH: 26.8 pg (ref 26.0–34.0)
MCHC: 30.9 g/dL (ref 30.0–36.0)
MCV: 86.6 fL (ref 80.0–100.0)
Platelets: 246 10*3/uL (ref 150–400)
RBC: 2.99 MIL/uL — ABNORMAL LOW (ref 3.87–5.11)
RDW: 15.9 % — ABNORMAL HIGH (ref 11.5–15.5)
WBC: 20.7 10*3/uL — ABNORMAL HIGH (ref 4.0–10.5)
nRBC: 0.7 % — ABNORMAL HIGH (ref 0.0–0.2)

## 2019-03-03 LAB — BASIC METABOLIC PANEL
Anion gap: 16 — ABNORMAL HIGH (ref 5–15)
BUN: 76 mg/dL — ABNORMAL HIGH (ref 8–23)
CO2: 25 mmol/L (ref 22–32)
Calcium: 7.1 mg/dL — ABNORMAL LOW (ref 8.9–10.3)
Chloride: 103 mmol/L (ref 98–111)
Creatinine, Ser: 3.46 mg/dL — ABNORMAL HIGH (ref 0.44–1.00)
GFR calc Af Amer: 15 mL/min — ABNORMAL LOW (ref >60)
GFR calc non Af Amer: 13 mL/min — ABNORMAL LOW (ref >60)
Glucose, Bld: 291 mg/dL — ABNORMAL HIGH (ref 70–99)
Potassium: 3.4 mmol/L — ABNORMAL LOW (ref 3.5–5.1)
Sodium: 144 mmol/L (ref 135–145)

## 2019-03-03 LAB — CULTURE, BLOOD (ROUTINE X 2)
Culture: NO GROWTH
Culture: NO GROWTH

## 2019-03-03 LAB — C-REACTIVE PROTEIN: CRP: 1.5 mg/dL — ABNORMAL HIGH (ref <1.0)

## 2019-03-03 LAB — GLUCOSE, CAPILLARY
Glucose-Capillary: 201 mg/dL — ABNORMAL HIGH (ref 70–99)
Glucose-Capillary: 226 mg/dL — ABNORMAL HIGH (ref 70–99)
Glucose-Capillary: 358 mg/dL — ABNORMAL HIGH (ref 70–99)
Glucose-Capillary: 189 mg/dL — ABNORMAL HIGH (ref 70–99)
Glucose-Capillary: 119 mg/dL — ABNORMAL HIGH (ref 70–99)
Glucose-Capillary: 192 mg/dL — ABNORMAL HIGH (ref 70–99)

## 2019-03-03 LAB — LACTATE DEHYDROGENASE: LDH: 890 U/L — ABNORMAL HIGH (ref 98–192)

## 2019-03-03 LAB — FERRITIN: Ferritin: 160 ng/mL (ref 11–307)

## 2019-03-03 LAB — MAGNESIUM: Magnesium: 2 mg/dL (ref 1.7–2.4)

## 2019-03-03 MED ORDER — QUETIAPINE FUMARATE 25 MG PO TABS: 50.0000 mg | ORAL_TABLET | Freq: Every evening | ORAL | Status: DC | PRN

## 2019-03-03 MED ORDER — SODIUM CHLORIDE 0.9 % IV SOLN
200.0000 mg | Freq: Once | INTRAVENOUS | Status: AC
  Administered 2019-03-03: 200 mg via INTRAVENOUS
  Filled 2019-03-03: qty 40

## 2019-03-03 MED ORDER — AMLODIPINE BESYLATE 5 MG PO TABS: 10.0000 mg | ORAL_TABLET | Freq: Every day | ORAL | Status: DC

## 2019-03-03 MED ORDER — MAGNESIUM OXIDE 400 (241.3 MG) MG PO TABS
400.0000 mg | ORAL_TABLET | Freq: Two times a day (BID) | ORAL | Status: AC
  Filled 2019-03-03: qty 1

## 2019-03-03 MED ORDER — SODIUM CHLORIDE 0.9 % IV SOLN
100.0000 mg | INTRAVENOUS | Status: DC
  Filled 2019-03-03: qty 20

## 2019-03-03 MED ORDER — POTASSIUM CHLORIDE 10 MEQ/100ML IV SOLN
10.0000 meq | INTRAVENOUS | Status: AC
  Administered 2019-03-03 (×3): 10 meq via INTRAVENOUS
  Filled 2019-03-03: qty 100

## 2019-03-03 MED ORDER — INSULIN ASPART 100 UNIT/ML ~~LOC~~ SOLN
0.0000 [IU] | Freq: Three times a day (TID) | SUBCUTANEOUS | Status: DC
  Administered 2019-03-03: 2 [IU] via SUBCUTANEOUS
  Administered 2019-03-03: 9 [IU] via SUBCUTANEOUS
  Administered 2019-03-03: 3 [IU] via SUBCUTANEOUS
  Administered 2019-03-04: 9 [IU] via SUBCUTANEOUS

## 2019-03-03 MED ORDER — QUETIAPINE FUMARATE 50 MG PO TABS: 50.0000 mg | ORAL_TABLET | Freq: Every day | ORAL | Status: DC

## 2019-03-03 MED ORDER — INSULIN ASPART 100 UNIT/ML ~~LOC~~ SOLN
3.0000 [IU] | Freq: Three times a day (TID) | SUBCUTANEOUS | Status: DC
  Administered 2019-03-03 – 2019-03-04 (×4): 3 [IU] via SUBCUTANEOUS

## 2019-03-03 MED ORDER — POTASSIUM CHLORIDE CRYS ER 20 MEQ PO TBCR: 40.0000 meq | EXTENDED_RELEASE_TABLET | Freq: Two times a day (BID) | ORAL | Status: DC

## 2019-03-03 MED ORDER — INSULIN ASPART 100 UNIT/ML ~~LOC~~ SOLN: 0.0000 [IU] | Freq: Every day | SUBCUTANEOUS | Status: DC

## 2019-03-03 NOTE — Progress Notes (Signed)
Pts daughter [name] picked up pts belongings from charge nurse and security per the request of [name]. Pts purse, clothing and cell phone sent home with dtr.

## 2019-03-03 NOTE — Progress Notes (Signed)
Patient experienced extreme confusion. Pulled both bilateral right and left AC IV out, oxygen off multiple times, telemetry leads off and trying to pull out catheter. Patient very agitated yelling out for help but this nurse unable to calm patient despite multiple attempts at trying to figure out what patient needing. Paged Dr Loleta Books on call

## 2019-03-03 NOTE — Progress Notes (Signed)
Called dtr [place] with updates. She is also requesting update from MD. I paged MD with name and number of dtr. Dtr face timed with pt per her request. Verbalized appreciation and denied any further questions or concerns at this time.

## 2019-03-03 NOTE — Progress Notes (Signed)
[facility] PROGRESS NOTE    Progress Note  [name]  WUX:[id number] DOB: [birth year] DOA: 01/30/2019 PCP: Elwyn Reach, MD     Brief Narrative:   Nichole Franklin is an 68 y.o. female past medical history of diabetes mellitus, hypertension hyperlipidemia has been feeling weak for 3 to 4 days, after being in contact with his sister which was positive SARS-Cov 2.  As then she has progressively felt weak with some diarrhea and shortness of breath.  At [facility] ED was found to be febrile, tachypneic and bilateral infiltrates SARS-CoV-2 was positive.  Found to be in acute renal failure with a creatinine of 7 (baseline 2.6), mild drop in hemoglobin of 3 g compared to 3 years ago.  02/22/2019 SARS-CoV-2 positive 02/27/2019 IV Solu-Medrol 03/03/2019 IV Remdesivir for 5 days  Assessment/Plan:   Acute respiratory failure with hypoxia due to   Pneumonia due to COVID-19 virus: Patient has multiple risk factors including age hypertension diabetes for multiorgan failure and decompensation. Try to keep the patient prone for at least 16 hours a day, her saturation requirements have improved she is now on 2 L nasal cannula satting greater than 93%. Continue IV Solu-Medrol and IV Remdesivir. She has remained afebrile Continue IV antibiotics for total 7 days.  Acute metabolic encephalopathy/acute confusional state likely due to infectious etiology: She seems to fluctuate throughout the day question of underlying undiagnosed dementia with superimposed delirium. Avoid benzodiazepines, narcotics, Ambien or Benadryl as this makes her confusion especially at night worse. Discontinue mittens and restraints. Received Ambien overnight which worsen her delirium, discontinue Ambien, increase her Seroquel use IV Haldol as needed.  Check keep potassium greater than 4 magnesium greater than 2. QTC is 510.  Acute kidney injury on chronic kidney disease stage IV: With a baseline creatinine of 2.6, in the  setting of ACE inhibitor and hydrochlorothiazide. On admission her creatinine was 7, likely hemodynamically mediated, creatinine is slowly improving,   DM (diabetes mellitus), type 2 with renal complications (HCC) All oral hypoglycemic agents were stopped, on admission her A1c was 9.3. Improved control on her blood glucose, it is slowly trending down we will increase her long-acting insulin.  Severe high anion gap metabolic acidosis: Likely due to renal disease now resolved.  Normocytic anemia Her hemoglobin back in 2018 was 12, on admission is 9.0. She has been on prophylaxis Heparin since 02/01/2019, her hemoglobin has slowly drifted down we will discontinue subcutaneous heparin.  There are no signs of overt bleeding. Continue SCD's.  Essential hypertension Hydrochlorothiazide and lisinopril were held on admission, cont Norvasc, will allow permissive hypertension with a goal less than 180/90.  Hypokalemia: Multifactorial due to acidosis and d AKI Continue to replete orally and it is improving we will try to keep above 4.  Hypomagnesemia: Cont oral repletion, it is trending down. Replete orally recheck in am.  Prolong Qtc: Potasium is low replete orally, also replete Mag. Will try to keep Potasium > 4.0 and Mag > 2.0. Repeat a basic metabolic panel this afternoon.   DVT prophylaxis: SCD's Family Communication:none Disposition Plan/Barrier to D/C: home once off oxygen Code Status:     Code Status Orders  (From admission, onward)         Start     Ordered   02/27/19 0034  Full code  Continuous     02/27/19 0034        Code Status History    This patient has a current code status but no historical code status.  Advance Care Planning Activity        IV Access:    Peripheral IV   Procedures and diagnostic studies:   No results found.   Medical Consultants:    None.  Anti-Infectives:   IV Rocephin and azithromycin  Subjective:    [name]  sedated this morning in no acute distress  Objective:    Vitals:   03/03/19 0400 03/03/19 0500 03/03/19 0547 03/03/19 0600  BP: (!) 176/71  (!) 183/74   Pulse: 91 84 71 77  Resp: (!) 24  18 15   Temp:   98.5 F (36.9 C)   TempSrc:   Oral   SpO2: 100% 100% 100% 100%  Weight:  126.4 kg    Height:        Intake/Output Summary (Last 24 hours) at 03/03/2019 0739 Last data filed at 03/03/2019 1308 Gross per 24 hour  Intake 703.3 ml  Output 2550 ml  Net -1846.7 ml   Filed Weights   02/27/19 0500 03/01/19 0430 03/03/19 0500  Weight: 76.7 kg 126.4 kg 126.4 kg    Exam: General exam: In no acute distress. Respiratory system: Good air movement and diffuse crackles at bases n. Cardiovascular system: S1 & S2 heard, RRR. No JVD. Gastrointestinal system: Abdomen is nondistended, soft and nontender.  Central nervous system: Confused not alert and oriented to person time or place she is moving all 4 extremities without any difficulties. Extremities: No pedal edema. Skin: No rashes, lesions or ulcers  Data Reviewed:    Labs: Basic Metabolic Panel: Recent Labs  Lab 02/27/19 0215 02/28/19 0512 03/01/19 0413 03/02/19 0300 03/02/19 1505 03/03/19 0330  NA 138 140 144 146* 146*  --   K 4.9 3.8 3.2* 3.0* 3.1*  --   CL 103 106 98 100 102  --   CO2 8* 16* 29 27 29   --   GLUCOSE 294* 312* 197* 269* 289*  --   BUN 80* 81* 83* 81* 81*  --   CREATININE 6.85* 5.87* 4.88* 4.14* 3.91*  --   CALCIUM 7.2* 6.9* 6.9* 6.8* 6.9*  --   MG  --  1.4* 2.1 2.0  --  2.0   GFR Estimated Creatinine Clearance: 18.7 mL/min (A) (by C-G formula based on SCr of 3.91 mg/dL (H)). Liver Function Tests: Recent Labs  Lab 01/30/2019 1247 02/27/19 0215 02/28/19 0512 03/01/19 0413 03/02/19 0300  AST 54* 48* 41 37 43*  ALT 20 21 18 16 19   ALKPHOS 178* 164* 140* 133* 133*  BILITOT 1.1 1.0 0.9 0.2* 0.4  PROT 8.0 7.4 6.8 6.9 6.9  ALBUMIN 3.0* 2.6* 2.4* 2.7* 2.7*   No results for input(s): LIPASE, AMYLASE in  the last 168 hours. No results for input(s): AMMONIA in the last 168 hours. Coagulation profile No results for input(s): INR, PROTIME in the last 168 hours. COVID-19 Labs  Recent Labs    03/01/19 0413 03/02/19 0300 03/03/19 0330  DDIMER 7.47* 7.65*  --   FERRITIN 112 122  --   LDH 606* 710*  --   CRP 6.5* 3.3* 1.5*    Lab Results  Component Value Date   SARSCOV2NAA POSITIVE (A) 02/15/2019    CBC: Recent Labs  Lab 02/02/2019 1247 02/27/19 0215 02/28/19 0512 03/01/19 0413 03/02/19 0300  WBC 19.0* 22.5* 24.3* 19.7* 18.7*  NEUTROABS 15.1* 20.5* 20.6* 16.7* 14.5*  HGB 9.0* 9.1* 8.7* 8.1* 8.1*  HCT 28.8* 29.4* 25.9* 24.7* 25.3*  MCV 86.2 87.2 80.9 82.6 84.9  PLT 289 315  329 273 256   Cardiac Enzymes: No results for input(s): CKTOTAL, CKMB, CKMBINDEX, TROPONINI in the last 168 hours. BNP (last 3 results) No results for input(s): PROBNP in the last 8760 hours. CBG: Recent Labs  Lab 03/02/19 1227 03/02/19 1656 03/02/19 1938 03/02/19 2328 03/03/19 0324  GLUCAP 266* 295* 267* 241* 201*   D-Dimer: Recent Labs    03/01/19 0413 03/02/19 0300  DDIMER 7.47* 7.65*   Hgb A1c: No results for input(s): HGBA1C in the last 72 hours. Lipid Profile: No results for input(s): CHOL, HDL, LDLCALC, TRIG, CHOLHDL, LDLDIRECT in the last 72 hours. Thyroid function studies: No results for input(s): TSH, T4TOTAL, T3FREE, THYROIDAB in the last 72 hours.  Invalid input(s): FREET3 Anemia work up: Recent Labs    03/01/19 0413 03/02/19 0300  FERRITIN 112 122   Sepsis Labs: Recent Labs  Lab 02/20/2019 1247 02/10/2019 1553 02/27/19 0215 02/28/19 0512 03/01/19 0413 03/02/19 0300  PROCALCITON 4.54  --   --   --  3.36  --   WBC 19.0*  --  22.5* 24.3* 19.7* 18.7*  LATICACIDVEN  --  1.7  --   --   --   --    Microbiology Recent Results (from the past 240 hour(s))  SARS Coronavirus 2 (CEPHEID- Performed in [place] hospital lab), Hosp Order     Status: Abnormal   Collection  Time: 02/14/2019 12:47 PM   Specimen: Nasopharyngeal Swab  Result Value Ref Range Status   SARS Coronavirus 2 POSITIVE (A) NEGATIVE Final    Comment: RESULT CALLED TO, READ BACK BY AND VERIFIED WITH: KIM MAIN @1410  ON 02/11/2019 BY FMW (NOTE) If result is NEGATIVE SARS-CoV-2 target nucleic acids are NOT DETECTED. The SARS-CoV-2 RNA is generally detectable in upper and lower  respiratory specimens during the acute phase of infection. The lowest  concentration of SARS-CoV-2 viral copies this assay can detect is 250  copies / mL. A negative result does not preclude SARS-CoV-2 infection  and should not be used as the sole basis for treatment or other  patient management decisions.  A negative result may occur with  improper specimen collection / handling, submission of specimen other  than nasopharyngeal swab, presence of viral mutation(s) within the  areas targeted by this assay, and inadequate number of viral copies  (<250 copies / mL). A negative result must be combined with clinical  observations, patient history, and epidemiological information. If result is POSITIVE SARS-CoV-2 target nucleic acids are DETECTED. T he SARS-CoV-2 RNA is generally detectable in upper and lower  respiratory specimens during the acute phase of infection.  Positive  results are indicative of active infection with SARS-CoV-2.  Clinical  correlation with patient history and other diagnostic information is  necessary to determine patient infection status.  Positive results do  not rule out bacterial infection or co-infection with other viruses. If result is PRESUMPTIVE POSTIVE SARS-CoV-2 nucleic acids MAY BE PRESENT.   A presumptive positive result was obtained on the submitted specimen  and confirmed on repeat testing.  While 2019 novel coronavirus  (SARS-CoV-2) nucleic acids may be present in the submitted sample  additional confirmatory testing may be necessary for epidemiological  and / or clinical  management purposes  to differentiate between  SARS-CoV-2 and other Sarbecovirus currently known to infect humans.  If clinically indicated additional testing with an alternate test  methodology [phone]) is  advised. The SARS-CoV-2 RNA is generally  detectable in upper and lower respiratory specimens during the acute  phase of  infection. The expected result is Negative. Fact Sheet for Patients:  StrictlyIdeas.no Fact Sheet for Healthcare Providers: BankingDealers.co.za This test is not yet approved or cleared by the [place] FDA and has been authorized for detection and/or diagnosis of SARS-CoV-2 by FDA under an Emergency Use Authorization (EUA).  This EUA will remain in effect (meaning this test can be used) for the duration of the COVID-19 declaration under Section 564(b)(1) of the Act, 21 U.S.C. section 360bbb-3(b)(1), unless the authorization is terminated or revoked sooner. Performed at [facility], [address] [place], [place] [address]   Culture, blood (Routine X 2) w Reflex to ID Panel     Status: None   Collection Time: 02/24/2019  3:54 PM   Specimen: BLOOD  Result Value Ref Range Status   Specimen Description BLOOD BLOOD LEFT FOREARM  Final   Special Requests   Final    BOTTLES DRAWN AEROBIC AND ANAEROBIC Blood Culture results may not be optimal due to an excessive volume of blood received in culture bottles   Culture   Final    NO GROWTH 5 DAYS Performed at [facility], [address] [place], [place] [address]    Report Status 03/03/2019 FINAL  Final  Culture, blood (Routine X 2) w Reflex to ID Panel     Status: None   Collection Time: 02/23/2019  3:54 PM   Specimen: BLOOD  Result Value Ref Range Status   Specimen Description BLOOD LEFT ANTECUBITAL  Final   Special Requests   Final    BOTTLES DRAWN AEROBIC AND ANAEROBIC Blood Culture results may not be optimal due to an excessive volume of  blood received in culture bottles   Culture   Final    NO GROWTH 5 DAYS Performed at [facility], [address] [place], [place] [address]    Report Status 03/03/2019 FINAL  Final     Medications:   . amLODipine  5 mg Oral Daily  . cholecalciferol  1,000 Units Oral Daily  . DULoxetine  30 mg Oral Daily  . ferrous sulfate  325 mg Oral BID WC  . insulin aspart  0-9 Units Subcutaneous Q4H  . insulin glargine  7 Units Subcutaneous BID  . ipratropium  2 puff Inhalation Q8H  . labetalol  10 mg Intravenous Q8H  . levalbuterol  2 puff Inhalation Q8H  . methylPREDNISolone (SOLU-MEDROL) injection  60 mg Intravenous Q12H  . pravastatin  80 mg Oral QHS  . vitamin C  1,000 mg Oral Daily  . zinc sulfate  220 mg Oral Daily   Continuous Infusions: . azithromycin 500 mg (03/02/19 2119)  . cefTRIAXone (ROCEPHIN)  IV 1 g (03/03/19 0539)      LOS: 5 days   Charlynne Cousins  [facility]  03/03/2019, 7:39 AM

## 2019-03-03 NOTE — Progress Notes (Signed)
Attempted to call patient emergency contact [name] to update her on patient wrist restraint application. No answer at this time

## 2019-03-03 NOTE — Progress Notes (Signed)
Called to give update to patients daughter, [name]. She was very Patent attorney. Was able to facetime to see her daughter and grandchild. Was able to repeat daughters name and say "I love you" through somewhat garbled speech. Daughter extremely positive and happy to see patient looking better this evening than last facetime.

## 2019-03-03 NOTE — Progress Notes (Signed)
Remdesivir - Pharmacy Brief Note   O:  ALT: 19 (03/02/19) CXR: BILATERAL airspace infiltrates consistent with pneumonia and diagnosis of COVID-19, mildly increased since previous exam. (02/28/19) SpO2: 100% on 2L [place]  Other considerations:  day #4 of admission, acute on chronic kidney disease with CrCl~21 ml/min, O2 sats have improved to 100% on decreasing oxygen requirements (HFNC > 2L [place]).   A/P:  Remdesivir 200 mg once followed by 100 mg daily x 4 days.   Gretta Arab PharmD, BCPS Clinical pharmacist phone 7am- 5pm: [phone] 03/03/2019 11:54 AM

## 2019-03-03 NOTE — Evaluation (Signed)
Clinical/Bedside Swallow Evaluation Patient Details  Name: Nichole Franklin MRN: [id number] Date of Birth: 10/28/1950  Today's Date: 03/03/2019 Time: SLP Start Time (ACUTE ONLY): 1350 SLP Stop Time (ACUTE ONLY): 1408 SLP Time Calculation (min) (ACUTE ONLY): 18 min  Past Medical History:  Past Medical History:  Diagnosis Date  . Allergy    mild  . Anxiety   . Cancer (HCC)    cervical cancer  . Degenerative disc disease, lumbar   . Diabetes mellitus   . Gastroesophageal reflux   . Hyperlipidemia   . Hypertension   . Neuromuscular disorder ([place])    small hiatal hernia   Past Surgical History:  Past Surgical History:  Procedure Laterality Date  . ABDOMINAL HYSTERECTOMY    . CHOLECYSTECTOMY     HPI:  Nichole Franklin is an 68 y.o. female past medical history of diabetes mellitus, hypertension hyperlipidemia has been feeling weak for 3 to 4 days, after being in contact with his sister which was positive SARS-Cov 2.  As then she has progressively felt weak with some diarrhea and shortness of breath.  At [facility] ED was found to be febrile, tachypneic and bilateral infiltrates SARS-CoV-2 was positive.  Found to be in acute renal failure with a creatinine of 7 (baseline 2.6), mild drop in hemoglobin of 3 g compared to 3 years ago.   Assessment / Plan / Recommendation Clinical Impression  Pt demonstrates severe cognitive impariment impacting ability to eat and drink. SLP repositioned pt and provided max cues for eye contact verbal interaction. Pts eyes mostly remained rolled back, she would startle but did not verbally respond or follow commands. When offered a straw or cup she did not recognize task. Touching liquid to lips did elicit pt oral response, pursed lips, and possible sip, but pt did not swallow. Swallow was only elicited when pt eventually coughed/choked. She would not accept or recognize a spoon for pudding. Surely at baseline she has a functional swallow, but since receiving sedation she  is unable to eat and drink and is unlikey to consistently accept PO meds.  Pt will be NPO, but can be offered sips and meds if verbally responsive, alert and aware.  SLP Visit Diagnosis: Dysphagia, unspecified (R13.10)    Aspiration Risk  Severe aspiration risk;Risk for inadequate nutrition/hydration    Diet Recommendation NPO except meds   Medication Administration: Whole meds with liquid(may need alternative means)    Other  Recommendations Oral Care Recommendations: Oral care QID Other Recommendations: Have oral suction available   Follow up Recommendations Skilled Nursing facility      Frequency and Duration min 2x/week  2 weeks       Prognosis Prognosis for Safe Diet Advancement: Good      Swallow Study   General HPI: Nichole Franklin is an 68 y.o. female past medical history of diabetes mellitus, hypertension hyperlipidemia has been feeling weak for 3 to 4 days, after being in contact with his sister which was positive SARS-Cov 2.  As then she has progressively felt weak with some diarrhea and shortness of breath.  At [facility] ED was found to be febrile, tachypneic and bilateral infiltrates SARS-CoV-2 was positive.  Found to be in acute renal failure with a creatinine of 7 (baseline 2.6), mild drop in hemoglobin of 3 g compared to 3 years ago. Type of Study: Bedside Swallow Evaluation Diet Prior to this Study: Regular;Thin liquids Temperature Spikes Noted: No History of Recent Intubation: No Behavior/Cognition: Doesn't follow directions;Uncooperative Oral Care Completed by SLP: No  Self-Feeding Abilities: Total assist Patient Positioning: Upright in bed Baseline Vocal Quality: Not observed Volitional Cough: Cognitively unable to elicit Volitional Swallow: Unable to elicit    Oral/Motor/Sensory Function Overall Oral Motor/Sensory Function: Severe impairment(cannot follow commands, chewing and rolling her tongue )   Ice Chips     Thin Liquid Thin Liquid:  Impaired Presentation: Straw;Cup Oral Phase Impairments: Poor awareness of bolus;Reduced lingual movement/coordination Oral Phase Functional Implications: Prolonged oral transit;Oral holding Pharyngeal  Phase Impairments: Unable to trigger swallow    Nectar Thick Nectar Thick Liquid: Not tested   Honey Thick Honey Thick Liquid: Not tested   Puree Puree: Impaired Oral Phase Impairments: Poor awareness of bolus   Solid    Herbie Baltimore, MA CCC-SLP  Acute Rehabilitation Services Pager [phone] Office [phone]  Solid: Not tested      Lynann Beaver 03/03/2019,2:34 PM

## 2019-03-04 ENCOUNTER — Inpatient Hospital Stay (HOSPITAL_COMMUNITY): Payer: Medicare HMO

## 2019-03-04 DIAGNOSIS — N179 Acute kidney failure, unspecified: Secondary | ICD-10-CM

## 2019-03-04 LAB — HEPATIC FUNCTION PANEL
ALT: 51 U/L — ABNORMAL HIGH (ref 0–44)
AST: 104 U/L — ABNORMAL HIGH (ref 15–41)
Albumin: 2.9 g/dL — ABNORMAL LOW (ref 3.5–5.0)
Alkaline Phosphatase: 197 U/L — ABNORMAL HIGH (ref 38–126)
Bilirubin, Direct: 0.2 mg/dL (ref 0.0–0.2)
Indirect Bilirubin: 0.7 mg/dL (ref 0.3–0.9)
Total Bilirubin: 0.9 mg/dL (ref 0.3–1.2)
Total Protein: 7 g/dL (ref 6.5–8.1)

## 2019-03-04 LAB — GLUCOSE, CAPILLARY
Glucose-Capillary: 311 mg/dL — ABNORMAL HIGH (ref 70–99)
Glucose-Capillary: 355 mg/dL — ABNORMAL HIGH (ref 70–99)
Glucose-Capillary: 394 mg/dL — ABNORMAL HIGH (ref 70–99)
Glucose-Capillary: 393 mg/dL — ABNORMAL HIGH (ref 70–99)
Glucose-Capillary: 292 mg/dL — ABNORMAL HIGH (ref 70–99)
Glucose-Capillary: 248 mg/dL — ABNORMAL HIGH (ref 70–99)
Glucose-Capillary: 175 mg/dL — ABNORMAL HIGH (ref 70–99)
Glucose-Capillary: 129 mg/dL — ABNORMAL HIGH (ref 70–99)

## 2019-03-04 LAB — CBC
HCT: 26.2 % — ABNORMAL LOW (ref 36.0–46.0)
Hemoglobin: 8.1 g/dL — ABNORMAL LOW (ref 12.0–15.0)
MCH: 26.9 pg (ref 26.0–34.0)
MCHC: 30.9 g/dL (ref 30.0–36.0)
MCV: 87 fL (ref 80.0–100.0)
Platelets: 256 10*3/uL (ref 150–400)
RBC: 3.01 MIL/uL — ABNORMAL LOW (ref 3.87–5.11)
RDW: 15.7 % — ABNORMAL HIGH (ref 11.5–15.5)
WBC: 23.2 10*3/uL — ABNORMAL HIGH (ref 4.0–10.5)
nRBC: 0.8 % — ABNORMAL HIGH (ref 0.0–0.2)

## 2019-03-04 LAB — POCT I-STAT 7, (LYTES, BLD GAS, ICA,H+H)
Bicarbonate: 23.8 mmol/L (ref 20.0–28.0)
Calcium, Ion: 1.02 mmol/L — ABNORMAL LOW (ref 1.15–1.40)
HCT: 23 % — ABNORMAL LOW (ref 36.0–46.0)
Hemoglobin: 7.8 g/dL — ABNORMAL LOW (ref 12.0–15.0)
O2 Saturation: 100 %
Patient temperature: 99.9
Potassium: 3.5 mmol/L (ref 3.5–5.1)
Sodium: 150 mmol/L — ABNORMAL HIGH (ref 135–145)
TCO2: 25 mmol/L (ref 22–32)
pCO2 arterial: 36 mmHg (ref 32.0–48.0)
pH, Arterial: 7.43 (ref 7.350–7.450)
pO2, Arterial: 270 mmHg — ABNORMAL HIGH (ref 83.0–108.0)

## 2019-03-04 LAB — BASIC METABOLIC PANEL
Anion gap: 17 — ABNORMAL HIGH (ref 5–15)
BUN: 79 mg/dL — ABNORMAL HIGH (ref 8–23)
CO2: 25 mmol/L (ref 22–32)
Calcium: 7.6 mg/dL — ABNORMAL LOW (ref 8.9–10.3)
Chloride: 107 mmol/L (ref 98–111)
Creatinine, Ser: 3.23 mg/dL — ABNORMAL HIGH (ref 0.44–1.00)
GFR calc Af Amer: 16 mL/min — ABNORMAL LOW (ref >60)
GFR calc non Af Amer: 14 mL/min — ABNORMAL LOW (ref >60)
Glucose, Bld: 230 mg/dL — ABNORMAL HIGH (ref 70–99)
Potassium: 3.5 mmol/L (ref 3.5–5.1)
Sodium: 149 mmol/L — ABNORMAL HIGH (ref 135–145)

## 2019-03-04 LAB — DIFFERENTIAL
Basophils Absolute: 0.1 10*3/uL (ref 0.0–0.1)
Basophils Relative: 0 %
Eosinophils Absolute: 0 10*3/uL (ref 0.0–0.5)
Eosinophils Relative: 0 %
Lymphocytes Relative: 8 %
Lymphs Abs: 1.7 10*3/uL (ref 0.7–4.0)
Monocytes Absolute: 1.9 10*3/uL — ABNORMAL HIGH (ref 0.1–1.0)
Monocytes Relative: 8 %
Neutro Abs: 17.7 10*3/uL — ABNORMAL HIGH (ref 1.7–7.7)
Neutrophils Relative %: 78 %

## 2019-03-04 LAB — CK: Total CK: 374 U/L — ABNORMAL HIGH (ref 38–234)

## 2019-03-04 LAB — TROPONIN I (HIGH SENSITIVITY): Troponin I (High Sensitivity): 61 ng/L — ABNORMAL HIGH (ref <18)

## 2019-03-04 LAB — LACTATE DEHYDROGENASE: LDH: 785 U/L — ABNORMAL HIGH (ref 98–192)

## 2019-03-04 LAB — MAGNESIUM
Magnesium: 2.1 mg/dL (ref 1.7–2.4)
Magnesium: 1.9 mg/dL (ref 1.7–2.4)

## 2019-03-04 LAB — FERRITIN: Ferritin: 181 ng/mL (ref 11–307)

## 2019-03-04 LAB — C-REACTIVE PROTEIN: CRP: 1.1 mg/dL — ABNORMAL HIGH

## 2019-03-04 LAB — PHOSPHORUS: Phosphorus: 4.3 mg/dL (ref 2.5–4.6)

## 2019-03-04 MED ORDER — ETOMIDATE 2 MG/ML IV SOLN
20.0000 mg | Freq: Once | INTRAVENOUS | Status: AC
  Administered 2019-03-04: 20 mg via INTRAVENOUS

## 2019-03-04 MED ORDER — HEPARIN (PORCINE) 25000 UT/250ML-% IV SOLN
1050.0000 [IU]/h | INTRAVENOUS | Status: DC
  Administered 2019-03-04: 1200 [IU]/h via INTRAVENOUS
  Filled 2019-03-04: qty 250

## 2019-03-04 MED ORDER — PRAVASTATIN SODIUM 40 MG PO TABS
80.0000 mg | ORAL_TABLET | Freq: Every day | ORAL | Status: DC
  Filled 2019-03-04: qty 2

## 2019-03-04 MED ORDER — METHYLPREDNISOLONE SODIUM SUCC 40 MG IJ SOLR
30.0000 mg | Freq: Two times a day (BID) | INTRAMUSCULAR | Status: DC
  Administered 2019-03-05 – 2019-03-08 (×7): 30 mg via INTRAVENOUS
  Filled 2019-03-04 (×7): qty 1

## 2019-03-04 MED ORDER — SODIUM CHLORIDE 0.45 % IV SOLN
INTRAVENOUS | Status: DC
  Administered 2019-03-04: 11:00:00 via INTRAVENOUS

## 2019-03-04 MED ORDER — FAMOTIDINE IN NACL 20-0.9 MG/50ML-% IV SOLN
20.0000 mg | Freq: Every day | INTRAVENOUS | Status: DC
  Administered 2019-03-04 – 2019-03-09 (×6): 20 mg via INTRAVENOUS
  Filled 2019-03-04 (×6): qty 50

## 2019-03-04 MED ORDER — POTASSIUM CHLORIDE 20 MEQ/15ML (10%) PO SOLN
40.0000 meq | Freq: Once | ORAL | Status: AC
  Administered 2019-03-04: 40 meq
  Filled 2019-03-04: qty 30

## 2019-03-04 MED ORDER — MIDAZOLAM HCL 2 MG/2ML IJ SOLN
2.0000 mg | Freq: Once | INTRAMUSCULAR | Status: AC
  Administered 2019-03-04: 2 mg via INTRAVENOUS

## 2019-03-04 MED ORDER — INSULIN REGULAR BOLUS VIA INFUSION
0.0000 [IU] | Freq: Three times a day (TID) | INTRAVENOUS | Status: DC
  Filled 2019-03-04: qty 10

## 2019-03-04 MED ORDER — ACETAMINOPHEN 325 MG PO TABS
650.0000 mg | ORAL_TABLET | Freq: Four times a day (QID) | ORAL | Status: DC | PRN
  Administered 2019-03-11 (×2): 650 mg
  Filled 2019-03-04 (×2): qty 2

## 2019-03-04 MED ORDER — ROCURONIUM BROMIDE 10 MG/ML (PF) SYRINGE
100.0000 mg | PREFILLED_SYRINGE | Freq: Once | INTRAVENOUS | Status: AC
  Administered 2019-03-04: 100 mg via INTRAVENOUS

## 2019-03-04 MED ORDER — SODIUM CHLORIDE 0.9 % IV SOLN
2.0000 g | Freq: Two times a day (BID) | INTRAVENOUS | Status: DC
  Administered 2019-03-04: 2 g via INTRAVENOUS
  Filled 2019-03-04 (×2): qty 2

## 2019-03-04 MED ORDER — DEXTROSE 50 % IV SOLN
25.0000 mL | INTRAVENOUS | Status: DC | PRN
  Administered 2019-03-16 – 2019-03-17 (×3): 25 mL via INTRAVENOUS
  Filled 2019-03-04: qty 50

## 2019-03-04 MED ORDER — INSULIN ASPART 100 UNIT/ML ~~LOC~~ SOLN
0.0000 [IU] | SUBCUTANEOUS | Status: DC
  Administered 2019-03-05: 08:00:00 2 [IU] via SUBCUTANEOUS
  Administered 2019-03-05: 01:00:00 3 [IU] via SUBCUTANEOUS
  Administered 2019-03-05: 04:00:00 2 [IU] via SUBCUTANEOUS

## 2019-03-04 MED ORDER — INSULIN ASPART 100 UNIT/ML ~~LOC~~ SOLN
5.0000 [IU] | SUBCUTANEOUS | Status: DC
  Administered 2019-03-05 (×3): 5 [IU] via SUBCUTANEOUS

## 2019-03-04 MED ORDER — FREE WATER
200.0000 mL | Freq: Three times a day (TID) | Status: DC
  Administered 2019-03-04 – 2019-03-05 (×3): 200 mL

## 2019-03-04 MED ORDER — FENTANYL CITRATE (PF) 100 MCG/2ML IJ SOLN
25.0000 ug | INTRAMUSCULAR | Status: DC | PRN
  Administered 2019-03-04: 11:00:00 25 ug via INTRAVENOUS

## 2019-03-04 MED ORDER — ZINC SULFATE 220 (50 ZN) MG PO CAPS
220.0000 mg | ORAL_CAPSULE | Freq: Every day | ORAL | Status: DC
  Administered 2019-03-05 – 2019-03-11 (×7): 220 mg
  Filled 2019-03-04 (×8): qty 1

## 2019-03-04 MED ORDER — ACETAMINOPHEN 650 MG RE SUPP: 650.0000 mg | Freq: Four times a day (QID) | RECTAL | Status: DC | PRN

## 2019-03-04 MED ORDER — VITAL HIGH PROTEIN PO LIQD
1000.0000 mL | ORAL | Status: AC
  Administered 2019-03-04 – 2019-03-05 (×2): 1000 mL

## 2019-03-04 MED ORDER — LINEZOLID 600 MG/300ML IV SOLN
600.0000 mg | Freq: Two times a day (BID) | INTRAVENOUS | Status: DC
  Filled 2019-03-04: qty 300

## 2019-03-04 MED ORDER — SODIUM CHLORIDE 0.9 % IV SOLN
1.0000 g | Freq: Two times a day (BID) | INTRAVENOUS | Status: DC
  Administered 2019-03-04 – 2019-03-06 (×4): 1 g via INTRAVENOUS
  Filled 2019-03-04 (×5): qty 1

## 2019-03-04 MED ORDER — PRO-STAT SUGAR FREE PO LIQD
30.0000 mL | Freq: Two times a day (BID) | ORAL | Status: DC
  Administered 2019-03-04 – 2019-03-07 (×8): 30 mL
  Filled 2019-03-04 (×8): qty 30

## 2019-03-04 MED ORDER — SODIUM CHLORIDE 0.45 % IV SOLN
INTRAVENOUS | Status: DC
  Administered 2019-03-05 – 2019-03-11 (×7): via INTRAVENOUS

## 2019-03-04 MED ORDER — VITAMIN C 500 MG PO TABS
1000.0000 mg | ORAL_TABLET | Freq: Every day | ORAL | Status: DC
  Administered 2019-03-05 – 2019-03-11 (×7): 1000 mg
  Filled 2019-03-04 (×8): qty 2

## 2019-03-04 MED ORDER — POTASSIUM CHLORIDE 20 MEQ/15ML (10%) PO SOLN: 20.0000 meq | Freq: Two times a day (BID) | ORAL | Status: DC

## 2019-03-04 MED ORDER — EPINEPHRINE 0.3 MG/0.3ML IJ SOAJ
0.3000 mg | INTRAMUSCULAR | Status: DC | PRN
  Filled 2019-03-04: qty 0.6

## 2019-03-04 MED ORDER — EPINEPHRINE (ANAPHYLAXIS) 1 MG/ML IJ SOLN
0.3000 mg | INTRAMUSCULAR | Status: DC | PRN
  Filled 2019-03-04: qty 0.3

## 2019-03-04 MED ORDER — AMLODIPINE BESYLATE 5 MG PO TABS
10.0000 mg | ORAL_TABLET | Freq: Every day | ORAL | Status: DC
  Filled 2019-03-04: qty 2

## 2019-03-04 MED ORDER — INSULIN GLARGINE 100 UNIT/ML ~~LOC~~ SOLN
35.0000 [IU] | Freq: Two times a day (BID) | SUBCUTANEOUS | Status: DC
  Administered 2019-03-04 – 2019-03-05 (×2): 35 [IU] via SUBCUTANEOUS
  Filled 2019-03-04 (×2): qty 0.35

## 2019-03-04 MED ORDER — ORAL CARE MOUTH RINSE
15.0000 mL | OROMUCOSAL | Status: DC
  Administered 2019-03-04 – 2019-03-08 (×39): 15 mL via OROMUCOSAL

## 2019-03-04 MED ORDER — POTASSIUM CHLORIDE 20 MEQ/15ML (10%) PO SOLN
40.0000 meq | Freq: Two times a day (BID) | ORAL | Status: AC
  Administered 2019-03-04 (×2): 40 meq via ORAL
  Filled 2019-03-04 (×2): qty 30

## 2019-03-04 MED ORDER — FENTANYL BOLUS VIA INFUSION
25.0000 ug | INTRAVENOUS | Status: DC | PRN
  Administered 2019-03-04: 16:00:00 50 ug via INTRAVENOUS
  Filled 2019-03-04: qty 50

## 2019-03-04 MED ORDER — POTASSIUM CHLORIDE 20 MEQ/15ML (10%) PO SOLN: 40.0000 meq | Freq: Once | ORAL | Status: DC

## 2019-03-04 MED ORDER — DIPHENHYDRAMINE HCL 50 MG/ML IJ SOLN
50.0000 mg | Freq: Two times a day (BID) | INTRAMUSCULAR | Status: DC
  Administered 2019-03-04 – 2019-03-05 (×4): 50 mg via INTRAVENOUS
  Filled 2019-03-04 (×5): qty 1

## 2019-03-04 MED ORDER — MIDAZOLAM HCL 2 MG/2ML IJ SOLN
1.0000 mg | INTRAMUSCULAR | Status: DC | PRN
  Administered 2019-03-04 – 2019-03-07 (×2): 1 mg via INTRAVENOUS
  Filled 2019-03-04 (×2): qty 2

## 2019-03-04 MED ORDER — QUETIAPINE FUMARATE 50 MG PO TABS: 50.0000 mg | ORAL_TABLET | Freq: Every day | ORAL | Status: DC

## 2019-03-04 MED ORDER — MIDAZOLAM HCL 2 MG/2ML IJ SOLN: 1.0000 mg | INTRAMUSCULAR | Status: DC | PRN

## 2019-03-04 MED ORDER — LINEZOLID 600 MG/300ML IV SOLN
600.0000 mg | Freq: Two times a day (BID) | INTRAVENOUS | Status: DC
  Administered 2019-03-04 – 2019-03-05 (×3): 600 mg via INTRAVENOUS
  Filled 2019-03-04 (×3): qty 300

## 2019-03-04 MED ORDER — AMLODIPINE BESYLATE 5 MG PO TABS
10.0000 mg | ORAL_TABLET | Freq: Every day | ORAL | Status: DC
  Administered 2019-03-04 – 2019-03-11 (×8): 10 mg
  Filled 2019-03-04 (×8): qty 2

## 2019-03-04 MED ORDER — INSULIN ASPART 100 UNIT/ML ~~LOC~~ SOLN
20.0000 [IU] | Freq: Once | SUBCUTANEOUS | Status: AC
  Administered 2019-03-04: 20 [IU] via SUBCUTANEOUS

## 2019-03-04 MED ORDER — INSULIN REGULAR(HUMAN) IN NACL 100-0.9 UT/100ML-% IV SOLN
INTRAVENOUS | Status: DC
  Administered 2019-03-04: 2.3 [IU]/h via INTRAVENOUS
  Filled 2019-03-04: qty 100

## 2019-03-04 MED ORDER — INSULIN GLARGINE 100 UNIT/ML ~~LOC~~ SOLN
20.0000 [IU] | Freq: Two times a day (BID) | SUBCUTANEOUS | Status: DC
  Administered 2019-03-04: 14:00:00 20 [IU] via SUBCUTANEOUS
  Filled 2019-03-04 (×3): qty 0.2

## 2019-03-04 MED ORDER — FENTANYL 2500MCG IN NS 250ML (10MCG/ML) PREMIX INFUSION
0.0000 ug/h | INTRAVENOUS | Status: DC
  Administered 2019-03-04: 25 ug/h via INTRAVENOUS
  Administered 2019-03-05: 22:00:00 100 ug/h via INTRAVENOUS
  Filled 2019-03-04 (×2): qty 250

## 2019-03-04 MED ORDER — CHLORHEXIDINE GLUCONATE 0.12% ORAL RINSE (MEDLINE KIT)
15.0000 mL | Freq: Two times a day (BID) | OROMUCOSAL | Status: DC
  Administered 2019-03-04 – 2019-03-08 (×9): 15 mL via OROMUCOSAL

## 2019-03-04 MED ORDER — DEXMEDETOMIDINE HCL IN NACL 400 MCG/100ML IV SOLN
0.0000 ug/kg/h | INTRAVENOUS | Status: AC
  Administered 2019-03-04: 0.8 ug/kg/h via INTRAVENOUS
  Administered 2019-03-04: 0.9 ug/kg/h via INTRAVENOUS
  Administered 2019-03-05: 1.1 ug/kg/h via INTRAVENOUS
  Administered 2019-03-05 – 2019-03-07 (×10): 1 ug/kg/h via INTRAVENOUS
  Filled 2019-03-04 (×6): qty 100
  Filled 2019-03-04: qty 200
  Filled 2019-03-04: qty 100
  Filled 2019-03-04: qty 200
  Filled 2019-03-04 (×2): qty 100

## 2019-03-04 MED ORDER — VITAMIN D 25 MCG (1000 UNIT) PO TABS
1000.0000 [IU] | ORAL_TABLET | Freq: Every day | ORAL | Status: DC
  Administered 2019-03-05 – 2019-03-17 (×13): 1000 [IU]
  Filled 2019-03-04 (×15): qty 1

## 2019-03-04 MED ORDER — FENTANYL CITRATE (PF) 100 MCG/2ML IJ SOLN
50.0000 ug | Freq: Once | INTRAMUSCULAR | Status: AC
  Administered 2019-03-04: 50 ug via INTRAVENOUS

## 2019-03-04 MED ORDER — DEXMEDETOMIDINE HCL IN NACL 400 MCG/100ML IV SOLN
0.0000 ug/kg/h | INTRAVENOUS | Status: DC
  Administered 2019-03-04: 0.4 ug/kg/h via INTRAVENOUS
  Filled 2019-03-04: qty 100

## 2019-03-04 MED ORDER — LACTATED RINGERS IV BOLUS
500.0000 mL | Freq: Once | INTRAVENOUS | Status: AC
  Administered 2019-03-04: 13:00:00 500 mL via INTRAVENOUS

## 2019-03-04 MED ORDER — FENTANYL CITRATE (PF) 100 MCG/2ML IJ SOLN
25.0000 ug | INTRAMUSCULAR | Status: DC | PRN
  Administered 2019-03-04 (×2): 100 ug via INTRAVENOUS
  Filled 2019-03-04 (×3): qty 2

## 2019-03-04 NOTE — Progress Notes (Signed)
ANTICOAGULATION CONSULT NOTE - Initial Consult  Pharmacy Consult for Heparin Indication: r/o VTE  Allergies  Allergen Reactions  . Remdesivir Anaphylaxis    Patient Measurements: Height: 5\' 6"  (167.6 cm) Weight: 164 lb 7.4 oz (74.6 kg) IBW/kg (Calculated) : 59.3 Heparin Dosing Weight: TBW  Vital Signs: Temp: 99.9 F (37.7 C) (07/04 0737) Temp Source: Oral (07/04 0737) BP: 204/93 (07/04 1100) Pulse Rate: 102 (07/04 1100)  Labs: Recent Labs    03/02/19 0300 03/02/19 1505 03/03/19 0954 03/04/19 0131 03/04/19 1100  HGB 8.1*  --  8.0* 8.1* 7.8*  HCT 25.3*  --  25.9* 26.2* 23.0*  PLT 256  --  246 256  --   CREATININE 4.14* 3.91* 3.46* 3.23*  --     Estimated Creatinine Clearance: 17.2 mL/min (A) (by C-G formula based on SCr of 3.23 mg/dL (H)).   Medical History: Past Medical History:  Diagnosis Date  . Allergy    mild  . Anxiety   . Cancer (HCC)    cervical cancer  . Degenerative disc disease, lumbar   . Diabetes mellitus   . Gastroesophageal reflux   . Hyperlipidemia   . Hypertension   . Neuromuscular disorder ([place])    small hiatal hernia    Medications:  Infusions:  . sodium chloride 75 mL/hr at 03/04/19 1200  . ceFEPime (MAXIPIME) IV    . dexmedetomidine (PRECEDEX) IV infusion 0.4 mcg/kg/hr (03/04/19 1051)  . famotidine (PEPCID) IV    . lactated ringers    . linezolid (ZYVOX) IV      Assessment: 54 yoF admitted on 6/28 with COVID pneumonia, AKI.  Today she is transferred to ICU with acute change in status.  Possible VTE event, pharmacy is consulted to dose Heparin IV.  Doppler pending.  Unable to obtain CTa d/t AKI. No PTA anticoagulation, She was briefly on inpatient VTE prophylaxis with Heparin SQ from 6/29-7/1 then d/c for anemia. CBC:  Hgb 9 on admission, decreased but stable ~8 since 7/1, decreased .  Plt remain WNL. RN notes no s/s bleeding.  No blood in stool during rectal temp.  Goal of Therapy:  Heparin level 0.3-0.7 units/ml Monitor  platelets by anticoagulation protocol: Yes   Plan:   No heparin bolus d/t anemia with decreasing Hgb and unconfirmed VTE.  May consider bolus if future heparin levels are subtherapeutic and no bleeding after starting heparin drip.   Start heparin IV infusion at 1200 units/hr  Heparin level 8 hours after starting  Daily heparin level and CBC  Continue to monitor H&H and platelets   Gretta Arab PharmD, BCPS Clinical pharmacist phone 7am- 5pm: [phone] 03/04/2019 11:47 AM

## 2019-03-04 NOTE — Progress Notes (Signed)
Spoke with pts daughter [name].  Updated on pt status.  Pt currently resting comfortably in bed.

## 2019-03-04 NOTE — Plan of Care (Signed)

## 2019-03-04 NOTE — Progress Notes (Signed)
[facility] PROGRESS NOTE    Progress Note  Nichole Franklin  SEG:[id number] DOB: 1951-02-22 DOA: 02/07/2019 PCP: Elwyn Reach, MD     Brief Narrative:   Nichole Franklin is an 69 y.o. female past medical history of diabetes mellitus, hypertension hyperlipidemia has been feeling weak for 3 to 4 days, after being in contact with his sister which was positive SARS-Cov 2.  As then she has progressively felt weak with some diarrhea and shortness of breath.  At [facility] ED was found to be febrile, tachypneic and bilateral infiltrates SARS-CoV-2 was positive.  Found to be in acute renal failure with a creatinine of 7 (baseline 2.6), mild drop in hemoglobin of 3 g compared to 3 years ago.  02/10/2019 SARS-CoV-2 positive 02/27/2019 IV Solu-Medrol 03/03/2019 IV Remdesivir for 5 days  Assessment/Plan:   Acute respiratory failure with hypoxia due to   Pneumonia due to COVID-19 virus: Patient has multiple risk factors including age hypertension diabetes for multiorgan failure and decompensation. On admission to [facility] she was started on IV steroids and her saturation was improving.   Her oxygen requirements worsen on 7.3.2020  And she was started on IV Remdesivir. She is currently on 3 L of oxygen satting greater than 95%, but breathing about 35-40 times per minutes. She has remained afebrile. She was previously IV Rocephin and azithromycin as she had a significant elevated white count and an elevated procalcitonin, due to the recent change in status I will antibiotics to IV cefepime and daptomycin check an MRSA PCR. She is significantly tachypneic breathing 35-40 times per minute, will transfer her to ICU. Check CXT, CT head and abd x-ray she is having abd pain on physical exam.  Hypertensive urgency: Patient blood pressure is significantly elevated and significant tachycardic, breathing about 35-40 times per minute.  She was given IV labetalol and IV hydralazine and her blood pressure has  come down, but she continues to be significantly tachypneic. Twelve-lead EKG was done that shows new T wave inversions on the lateral leads V4 to V6 which were not present on initial EKG. We will check cardiac biomarkers, we cannot obtain a history from the patient as she is significantly encephalopathic. We will get a CT scan of the head to rule out any bleeds. Her Heparin was held after admission as her Hbg drifted down to 8.1. On admisison hbg was 9.1 (back in 2018 hbg was 12.0), also included in the differential would be acute PE. Also the differential is included severe acute confusional state, she is gotten several doses of IV Haldol and Seroquel with minimal improvement.  Her last QTC was above 520 her potassium is 0.5 we will continue to replete IV and her last magnesium was 2.1.  Enlarged tongue question angioedema: When I went to the room this morning the patient was breathing 35-40 times per minute she had her toungue out it appeared dry and thick.  With a blood pressure in the 200s and a heart rate of 110-1 40, and the differential is including allergic reaction. She is currently on IV Solu-Medrol, she was started yesterday on IV Remdesivir. We will hold IV Remdesivir for now, start her on IV Benadryl and IV famotidine.  Acute metabolic encephalopathy/acute confusional state likely due to infectious etiology: She seems to fluctuate throughout the day question of underlying undiagnosed dementia with superimposed delirium. Avoid benzodiazepines, narcotics, Ambien or Benadryl as this makes her confusion especially at night worse.  Acute kidney injury on chronic kidney disease stage IV:  With a baseline creatinine of 2.6, in the setting of ACE inhibitor and hydrochlorothiazide. On admission her creatinine was 7, likely hemodynamically mediated, change IV fluids to half-normal saline, her creatinine is improving slowly.  DM (diabetes mellitus), type 2 with renal complications (HCC) All  oral hypoglycemic agents were stopped, on admission her A1c was 9.3. Improved control on her blood glucose, it is slowly trending down we will increase her long-acting insulin.  Severe high anion gap metabolic acidosis: Likely due to renal disease now resolved.  Normocytic anemia Her hemoglobin back in 2018 was 12, on admission is 9.0. She has been on prophylaxis Heparin since 02/10/2019, her hemoglobin has slowly drifted down we will her heparin was discontinued 7.1.2020 there are no signs of overt bleeding. Continue SCD's. FOBT stools x3 is pending.  Essential hypertension Hydrochlorothiazide and lisinopril were held on admission, cont Norvasc, see hypertensive urgency for further details.  Hypokalemia: Multifactorial due to acidosis and d AKI Is  improving slowly will continue oral repletion.  Hypomagnesemia: Resolved with oral repletion.  Prolong Qtc: Potasium is improved but still 3.5 replete orally, also replete Mag. Will try to keep Potasium > 4.0 and Mag > 2.0.   DVT prophylaxis: SCD's Family Communication:none Disposition Plan/Barrier to D/C: home once off oxygen Code Status:     Code Status Orders  (From admission, onward)         Start     Ordered   02/27/19 0034  Full code  Continuous     02/27/19 0034        Code Status History    This patient has a current code status but no historical code status.   Advance Care Planning Activity        IV Access:    Peripheral IV   Procedures and diagnostic studies:   No results found.   Medical Consultants:    None.  Anti-Infectives:   IV Rocephin and azithromycin  Subjective:    Nichole Franklin in acute distress  Objective:    Vitals:   03/04/19 0400 03/04/19 0500 03/04/19 0600 03/04/19 0616  BP: (!) 186/90     Pulse: (!) 109 (!) 121 (!) 124   Resp: (!) 28 (!) 25 (!) 31   Temp:    98.4 F (36.9 C)  TempSrc:    Axillary  SpO2: 100% 100% 99%   Weight:      Height:         Intake/Output Summary (Last 24 hours) at 03/04/2019 0700 Last data filed at 03/04/2019 0400 Gross per 24 hour  Intake 700 ml  Output 2700 ml  Net -2000 ml   Filed Weights   02/27/19 0500 03/01/19 0430 03/03/19 0500  Weight: 76.7 kg 126.4 kg 126.4 kg    Exam: General exam: In acute distress breathing about 35 times per minute HEENT: Her tongue appears thick and wide also dry, no appreciated stridor Respiratory system: Good air movement and diffuse crackles and wheezing Cardiovascular system: S1 & S2 heard, RRR. No JVD. Gastrointestinal system: Abdomen is nondistended, soft and nontender.  Central nervous system: She is completely disoriented able to move all 4 extremities without any difficulties. Extremities: No pedal edema. Skin: No rashes, lesions or ulcers  Data Reviewed:    Labs: Basic Metabolic Panel: Recent Labs  Lab 02/28/19 0512 03/01/19 0413 03/02/19 0300 03/02/19 1505 03/03/19 0330 03/03/19 0954 03/04/19 0131  NA 140 144 146* 146*  --  144 149*  K 3.8 3.2* 3.0* 3.1*  --  3.4*  3.5  CL 106 98 100 102  --  103 107  CO2 16* 29 27 29   --  25 25  GLUCOSE 312* 197* 269* 289*  --  291* 230*  BUN 81* 83* 81* 81*  --  76* 79*  CREATININE 5.87* 4.88* 4.14* 3.91*  --  3.46* 3.23*  CALCIUM 6.9* 6.9* 6.8* 6.9*  --  7.1* 7.6*  MG 1.4* 2.1 2.0  --  2.0  --  2.1   GFR Estimated Creatinine Clearance: 22.7 mL/min (A) (by C-G formula based on SCr of 3.23 mg/dL (H)). Liver Function Tests: Recent Labs  Lab 02/03/2019 1247 02/27/19 0215 02/28/19 0512 03/01/19 0413 03/02/19 0300  AST 54* 48* 41 37 43*  ALT 20 21 18 16 19   ALKPHOS 178* 164* 140* 133* 133*  BILITOT 1.1 1.0 0.9 0.2* 0.4  PROT 8.0 7.4 6.8 6.9 6.9  ALBUMIN 3.0* 2.6* 2.4* 2.7* 2.7*   No results for input(s): LIPASE, AMYLASE in the last 168 hours. No results for input(s): AMMONIA in the last 168 hours. Coagulation profile No results for input(s): INR, PROTIME in the last 168 hours. COVID-19 Labs  Recent  Labs    03/02/19 0300 03/03/19 0330 03/03/19 0954 03/04/19 0131  DDIMER 7.65*  --   --   --   FERRITIN 122  --  160  --   LDH 710*  --  890* 785*  CRP 3.3* 1.5*  --   --     Lab Results  Component Value Date   SARSCOV2NAA POSITIVE (A) 02/25/2019    CBC: Recent Labs  Lab 01/31/2019 1247 02/27/19 0215 02/28/19 0512 03/01/19 0413 03/02/19 0300 03/03/19 0954 03/04/19 0131  WBC 19.0* 22.5* 24.3* 19.7* 18.7* 20.7* 23.2*  NEUTROABS 15.1* 20.5* 20.6* 16.7* 14.5*  --   --   HGB 9.0* 9.1* 8.7* 8.1* 8.1* 8.0* 8.1*  HCT 28.8* 29.4* 25.9* 24.7* 25.3* 25.9* 26.2*  MCV 86.2 87.2 80.9 82.6 84.9 86.6 87.0  PLT 289 315 329 273 256 246 256   Cardiac Enzymes: No results for input(s): CKTOTAL, CKMB, CKMBINDEX, TROPONINI in the last 168 hours. BNP (last 3 results) No results for input(s): PROBNP in the last 8760 hours. CBG: Recent Labs  Lab 03/03/19 1132 03/03/19 1557 03/03/19 1947 03/03/19 2322 03/04/19 0401  GLUCAP 358* 189* 119* 192* 311*   D-Dimer: Recent Labs    03/02/19 0300  DDIMER 7.65*   Hgb A1c: No results for input(s): HGBA1C in the last 72 hours. Lipid Profile: No results for input(s): CHOL, HDL, LDLCALC, TRIG, CHOLHDL, LDLDIRECT in the last 72 hours. Thyroid function studies: No results for input(s): TSH, T4TOTAL, T3FREE, THYROIDAB in the last 72 hours.  Invalid input(s): FREET3 Anemia work up: Recent Labs    03/02/19 0300 03/03/19 0954  FERRITIN 122 160   Sepsis Labs: Recent Labs  Lab 02/10/2019 1247 02/19/2019 1553  03/01/19 0413 03/02/19 0300 03/03/19 0954 03/04/19 0131  PROCALCITON 4.54  --   --  3.36  --   --   --   WBC 19.0*  --    < > 19.7* 18.7* 20.7* 23.2*  LATICACIDVEN  --  1.7  --   --   --   --   --    < > = values in this interval not displayed.   Microbiology Recent Results (from the past 240 hour(s))  SARS Coronavirus 2 (CEPHEID- Performed in [place] hospital lab), Hosp Order     Status: Abnormal   Collection Time: 02/25/2019  12:47 PM   Specimen: Nasopharyngeal Swab  Result Value Ref Range Status   SARS Coronavirus 2 POSITIVE (A) NEGATIVE Final    Comment: RESULT CALLED TO, READ BACK BY AND VERIFIED WITH: KIM MAIN @1410  ON 02/09/2019 BY FMW (NOTE) If result is NEGATIVE SARS-CoV-2 target nucleic acids are NOT DETECTED. The SARS-CoV-2 RNA is generally detectable in upper and lower  respiratory specimens during the acute phase of infection. The lowest  concentration of SARS-CoV-2 viral copies this assay can detect is 250  copies / mL. A negative result does not preclude SARS-CoV-2 infection  and should not be used as the sole basis for treatment or other  patient management decisions.  A negative result may occur with  improper specimen collection / handling, submission of specimen other  than nasopharyngeal swab, presence of viral mutation(s) within the  areas targeted by this assay, and inadequate number of viral copies  (<250 copies / mL). A negative result must be combined with clinical  observations, patient history, and epidemiological information. If result is POSITIVE SARS-CoV-2 target nucleic acids are DETECTED. T he SARS-CoV-2 RNA is generally detectable in upper and lower  respiratory specimens during the acute phase of infection.  Positive  results are indicative of active infection with SARS-CoV-2.  Clinical  correlation with patient history and other diagnostic information is  necessary to determine patient infection status.  Positive results do  not rule out bacterial infection or co-infection with other viruses. If result is PRESUMPTIVE POSTIVE SARS-CoV-2 nucleic acids MAY BE PRESENT.   A presumptive positive result was obtained on the submitted specimen  and confirmed on repeat testing.  While 2019 novel coronavirus  (SARS-CoV-2) nucleic acids may be present in the submitted sample  additional confirmatory testing may be necessary for epidemiological  and / or clinical management purposes   to differentiate between  SARS-CoV-2 and other Sarbecovirus currently known to infect humans.  If clinically indicated additional testing with an alternate test  methodology [phone]) is  advised. The SARS-CoV-2 RNA is generally  detectable in upper and lower respiratory specimens during the acute  phase of infection. The expected result is Negative. Fact Sheet for Patients:  StrictlyIdeas.no Fact Sheet for Healthcare Providers: BankingDealers.co.za This test is not yet approved or cleared by the [place] FDA and has been authorized for detection and/or diagnosis of SARS-CoV-2 by FDA under an Emergency Use Authorization (EUA).  This EUA will remain in effect (meaning this test can be used) for the duration of the COVID-19 declaration under Section 564(b)(1) of the Act, 21 U.S.C. section 360bbb-3(b)(1), unless the authorization is terminated or revoked sooner. Performed at [facility], [address] [place], [place] [address]   Culture, blood (Routine X 2) w Reflex to ID Panel     Status: None   Collection Time: 02/02/2019  3:54 PM   Specimen: BLOOD  Result Value Ref Range Status   Specimen Description BLOOD BLOOD LEFT FOREARM  Final   Special Requests   Final    BOTTLES DRAWN AEROBIC AND ANAEROBIC Blood Culture results may not be optimal due to an excessive volume of blood received in culture bottles   Culture   Final    NO GROWTH 5 DAYS Performed at [facility], [address] [place], [place] [address]    Report Status 03/03/2019 FINAL  Final  Culture, blood (Routine X 2) w Reflex to ID Panel     Status: None   Collection Time: 02/22/2019  3:54 PM  Specimen: BLOOD  Result Value Ref Range Status   Specimen Description BLOOD LEFT ANTECUBITAL  Final   Special Requests   Final    BOTTLES DRAWN AEROBIC AND ANAEROBIC Blood Culture results may not be optimal due to an excessive volume of blood received in  culture bottles   Culture   Final    NO GROWTH 5 DAYS Performed at [facility], [address], [place], [place] [address]    Report Status 03/03/2019 FINAL  Final     Medications:   . amLODipine  10 mg Oral Daily  . cholecalciferol  1,000 Units Oral Daily  . DULoxetine  30 mg Oral Daily  . ferrous sulfate  325 mg Oral BID WC  . insulin aspart  0-5 Units Subcutaneous QHS  . insulin aspart  0-9 Units Subcutaneous TID WC  . insulin aspart  3 Units Subcutaneous TID WC  . insulin glargine  7 Units Subcutaneous BID  . ipratropium  2 puff Inhalation Q8H  . labetalol  10 mg Intravenous Q8H  . levalbuterol  2 puff Inhalation Q8H  . magnesium oxide  400 mg Oral BID  . methylPREDNISolone (SOLU-MEDROL) injection  60 mg Intravenous Q12H  . pravastatin  80 mg Oral QHS  . QUEtiapine  50 mg Oral QHS  . vitamin C  1,000 mg Oral Daily  . zinc sulfate  220 mg Oral Daily   Continuous Infusions: . azithromycin 500 mg (03/03/19 2250)  . cefTRIAXone (ROCEPHIN)  IV 1 g (03/04/19 0216)  . remdesivir 100 mg in NS 250 mL        LOS: 6 days   [facility]  03/04/2019, 7:00 AM

## 2019-03-04 NOTE — Progress Notes (Addendum)
ICU charge nurse called to assess patient on PCU. Patient labored and disoriented. Possible angioedema. Tongue and face swollen. EKG performed and immediate transferred to ICU. Dr. Lake Bells at the bedside to perform intubation at 940am. Estil Daft RN drew up RSI medications verbally ordered, 64mcg Fentanyl, 2mg  versed, 100 rocuronium given. 7.5 ETT, 23 at the lip. Patient stable. Central line to be placed.

## 2019-03-04 NOTE — Progress Notes (Signed)
MD on-call paged regarding need for sitter and patients elevated bp and hr. Patient very agitated, trying to rip off mitts, pulling at iv line and foley, swinging legs sideways oob, unable to redirect, patient unable to voice any complaints, prn haldol given but was not effective. Dr. Loleta Books returned call within minutes, gave verbal order for safety sitter, aware of bp and hr, continue current medications as ordered.

## 2019-03-04 NOTE — Procedures (Signed)
Intubation Procedure Note Jaycelyn Orrison [id number] July 05, 1951  Procedure: Intubation Indications: Airway protection and maintenance  Procedure Details Consent: Unable to obtain consent because of emergent medical necessity. Time Out: Verified patient identification, verified procedure, site/side was marked, verified correct patient position, special equipment/implants available, medications/allergies/relevent history reviewed, required imaging and test results available.  Performed  Drugs versed 63m IV, Fentanyl 559m IV, Etomidate 2050mV, Rocuronium 100m97m DL x 1 with MAC 3 blade Grade 2 view: tongue swelling, thick secretions cleared with suctioning, no cord edema noted but could only visualize epiglottis and erytenoids 7.5 ET tube passed through cords under direct visualization Placement confirmed with bilateral breath sounds, positive EtCO2 change and smoke in tube   Evaluation Hemodynamic Status: BP stable throughout; O2 sats: transiently fell during during procedure Patient's Current Condition: stable Complications: No apparent complications Patient did tolerate procedure well. Chest X-ray ordered to verify placement.  CXR: pending.   DougSimonne Maffucci/2020

## 2019-03-04 NOTE — Progress Notes (Signed)
**Note De-identified  Obfuscation** Sputum collected and sent to lab 

## 2019-03-04 NOTE — Progress Notes (Signed)
Patient's loc has been the same since admission, md aware, bp and hr reported to md on-call- continue patients current medications as ordered

## 2019-03-04 NOTE — Progress Notes (Signed)
During rounds MD assessed patients facial and tongue swelling. MD was also concerned about pts elevated HR and BP. Charge RNs notified. EKG preformed at the bedside. Patient then transferred to ICU.

## 2019-03-04 NOTE — Procedures (Signed)
Central Venous Catheter Insertion Procedure Note Nichole Franklin [id number] Jul 31, 1951  Procedure: Insertion of Central Venous Catheter Indications: Assessment of intravascular volume  Procedure Details Consent: Risks of procedure as well as the alternatives and risks of each were explained to the (patient/caregiver).  Consent for procedure obtained. Time Out: Verified patient identification, verified procedure, site/side was marked, verified correct patient position, special equipment/implants available, medications/allergies/relevent history reviewed, required imaging and test results available.  Performed  Maximum sterile technique was used including antiseptics, cap, gloves, gown, hand hygiene, mask and sheet. Skin prep: Chlorhexidine; local anesthetic administered A antimicrobial bonded/coated triple lumen catheter was placed in the left internal jugular vein using the Seldinger technique.  Ultrasound was used to verify the patency of the vein and for real time needle guidance.  Evaluation Blood flow good Complications: No apparent complications Patient did tolerate procedure well. Chest X-ray ordered to verify placement.  CXR: pending.  Nichole Franklin 03/04/2019, 11:37 AM

## 2019-03-04 NOTE — Progress Notes (Signed)
Spoke with pts daughter [name].  Updated on pt status.  Pt resting comfortably in bed.  Deferred diagnostic questions to Dr Venetia Constable.  MD stated that he updated daughter today and will speak with her tomorrow regarding her questions.

## 2019-03-04 NOTE — Progress Notes (Signed)
PHARMACY NOTE:  ANTIMICROBIAL RENAL DOSAGE ADJUSTMENT  Current antimicrobial regimen includes a mismatch between antimicrobial dosage and estimated renal function.  As per policy approved by the Pharmacy & Therapeutics and Medical Executive Committees, the antimicrobial dosage will be adjusted accordingly.  Indication: HCAP Renal Function: Estimated Creatinine Clearance: 17.2 mL/min (A) (by C-G formula based on SCr of 3.23 mg/dL (H)).    Antimicrobial dosage has been changed to:   Cefepime 1g IV q12h. Linezolid 600mg  IV q12h.   Thank you for allowing pharmacy to be a part of this patient's care.  Gretta Arab PharmD, BCPS Clinical pharmacist phone 7am- 5pm: [phone] 03/04/2019 12:48 PM

## 2019-03-04 NOTE — Progress Notes (Signed)
Adjusting famotidine to q24 due to CrCl<50 ml/min  Onnie Boer, PharmD, BCIDP, AAHIVP, CPP Infectious Disease Pharmacist 03/04/2019 8:56 AM

## 2019-03-04 NOTE — Consult Note (Signed)
NAME:  Nichole Franklin, MRN:  [id number], DOB:  1951-04-02, LOS: 6 ADMISSION DATE:  02/28/2019, CONSULTATION DATE:  7/4 REFERRING MD:  Aileen Fass, CHIEF COMPLAINT:  Found confused, tongue swelling  Brief History   This is a 68 year old female with an extensive past medical history who was admitted on February 26, 2019 with acute kidney injury weakness and diarrhea in the setting of a new COVID-19 diagnosis.  She was admitted to the [facility] due to her renal failure which improved with IV fluids.  She was transferred to [facility].  On hospital day 6 she developed worsening confusion and tongue swelling after - severe and was transferred to the ICU emergently for intubation.  History of present illness   No further history could be obtained, refer to brief history above  Past Medical History  Hypertension Hyperlipidemia Diabetes mellitus Gastroesophageal reflux disease History of cervical cancer Anxiety  Significant Hospital Events   June 28 admission [facility] from [facility] for AKI, poor po intake June 30 transfer to [facility], confusion July 2: worsening hypoxemia July 3 remdesivir, worsening confusion July 4 tongue swelling, hypertension, severe encephalopathy, transfer to ICU, intubation, new worsening fever   Consults:  Nephrology, signed off PCCM  Procedures:  July 4 left IJ CVL July 4 endotracheal tube  Significant Diagnostic Tests:  June 28 CT scan abdomen pelvis for nephrolithiasis: No explanation of renal failure, no urinary calculus, extensive multifocal groundglass opacity in lungs, sclerotic inferior endplate deformities of T11 and T12 July 4 CT Head > pending  Micro Data:  7/4 resp culture  Antimicrobials:  June 28 ceftriaxone and azithromycin through July 3 July 4 cefepime, linezolid July 3 remdesivir  Interim history/subjective:  Admitted to the ICU for worsening confusion, required emergent intubation Tongue swelling noted  Objective   Blood  pressure (!) 162/77, pulse (!) 106, temperature 99.9 F (37.7 C), temperature source Oral, resp. rate (!) 35, height 5\' 6"  (1.676 m), weight 126.4 kg, SpO2 95 %.        Intake/Output Summary (Last 24 hours) at 03/04/2019 1000 Last data filed at 03/04/2019 0400 Gross per 24 hour  Intake 340 ml  Output 2700 ml  Net -2360 ml   Filed Weights   02/27/19 0500 03/01/19 0430 03/03/19 0500  Weight: 76.7 kg 126.4 kg 126.4 kg    Examination:  General: confused in hospital bed HENT: NCAT moderate tongue swelling noted, no lip swelling PULM: Increased work of breathing B, normal effort CV: RRR, no mgr GI: BS+, soft, nontender MSK: normal bulk and tone Neuro: confused, not following commands, moving all four extremities   Resolved Hospital Problem list      Assessment & Plan:  Acute encephalopathy: Unclear etiology, could be related to steroids, hospital delirium in setting of multiple metabolic derangements, less likely press syndrome or related to direct neurotoxicity from COVID. Control blood pressure Stat head CT Intubation for airway protection  Worsening acute respiratory failure with hypoxemia in setting of COVID-19 pneumonia: Pulmonary embolism? Continue Solu-Medrol Intubation, mechanical ventilation via ARDS protocol Target tidal volume 6 to 8 cc/kg ideal body weight Target plateau pressure less than 30 cm of water Target driving pressure less than 15 cm of water Ventilator associated pneumonia prevention protocol Start heparin infusion Check LE doppler Monitor carefully for bleeding  Apparent hypovolemia based on physical exam, collapsible internal jugular vein on central line placement CVP monitoring Bolus lactated Ringer's now  Need for sedation for mechanical ventilation: Precedex infusion, fentanyl and Versed as needed for  synchrony Hold quetiapine considering QT prolongation  QT prolongation: Hold quetiapine Hold azithromycin  Worsening fever, hypoxemia: Most  likely due to COVID-19, however consider healthcare associated pneumonia Respiratory culture now Agree with broadening antibiotics to cefepime and linezolid overnight Monitor culture results Cannot give remdesivir due to allergic reaction  Angioedema: No anaphylaxis, concern for reaction to remdesivir though timing not classic for allergic reaction Hold further doses of remdesivir Continue Solu-Medrol Supportive care  Best practice:  Diet: Start tube feeding Pain/Anxiety/Delirium protocol (if indicated): yes, RASS target -1 to -2 VAP protocol (if indicated): yes DVT prophylaxis: lovenox GI prophylaxis: famotidine Glucose control: SSI Mobility: bed rest Code Status: full Family Communication: updated her daughter [name] by phone on 7/4 Disposition: remain in ICU  Labs   CBC: Recent Labs  Lab 02/27/19 0215 02/28/19 0512 03/01/19 0413 03/02/19 0300 03/03/19 0954 03/04/19 0131  WBC 22.5* 24.3* 19.7* 18.7* 20.7* 23.2*  NEUTROABS 20.5* 20.6* 16.7* 14.5*  --  17.7*  HGB 9.1* 8.7* 8.1* 8.1* 8.0* 8.1*  HCT 29.4* 25.9* 24.7* 25.3* 25.9* 26.2*  MCV 87.2 80.9 82.6 84.9 86.6 87.0  PLT 315 329 273 256 246 474    Basic Metabolic Panel: Recent Labs  Lab 02/28/19 0512 03/01/19 0413 03/02/19 0300 03/02/19 1505 03/03/19 0330 03/03/19 0954 03/04/19 0131  NA 140 144 146* 146*  --  144 149*  K 3.8 3.2* 3.0* 3.1*  --  3.4* 3.5  CL 106 98 100 102  --  103 107  CO2 16* 29 27 29   --  25 25  GLUCOSE 312* 197* 269* 289*  --  291* 230*  BUN 81* 83* 81* 81*  --  76* 79*  CREATININE 5.87* 4.88* 4.14* 3.91*  --  3.46* 3.23*  CALCIUM 6.9* 6.9* 6.8* 6.9*  --  7.1* 7.6*  MG 1.4* 2.1 2.0  --  2.0  --  2.1   GFR: Estimated Creatinine Clearance: 22.7 mL/min (A) (by C-G formula based on SCr of 3.23 mg/dL (H)). Recent Labs  Lab 02/09/2019 1247 02/16/2019 1553  03/01/19 0413 03/02/19 0300 03/03/19 0954 03/04/19 0131  PROCALCITON 4.54  --   --  3.36  --   --   --   WBC 19.0*  --    < >  19.7* 18.7* 20.7* 23.2*  LATICACIDVEN  --  1.7  --   --   --   --   --    < > = values in this interval not displayed.    Liver Function Tests: Recent Labs  Lab 02/27/19 0215 02/28/19 0512 03/01/19 0413 03/02/19 0300 03/04/19 0131  AST 48* 41 37 43* 104*  ALT 21 18 16 19  51*  ALKPHOS 164* 140* 133* 133* 197*  BILITOT 1.0 0.9 0.2* 0.4 0.9  PROT 7.4 6.8 6.9 6.9 7.0  ALBUMIN 2.6* 2.4* 2.7* 2.7* 2.9*   No results for input(s): LIPASE, AMYLASE in the last 168 hours. No results for input(s): AMMONIA in the last 168 hours.  ABG    Component Value Date/Time   PHART 7.414 02/28/2019 0747   PCO2ART 28.7 (L) 02/28/2019 0747   PO2ART 56.1 (L) 02/28/2019 0747   HCO3 18.2 (L) 02/28/2019 0747   TCO2 24 12/20/2007 2203   ACIDBASEDEF 5.7 (H) 02/28/2019 0747   O2SAT 88.7 02/28/2019 0747     Coagulation Profile: No results for input(s): INR, PROTIME in the last 168 hours.  Cardiac Enzymes: No results for input(s): CKTOTAL, CKMB, CKMBINDEX, TROPONINI in the last 168 hours.  HbA1C: Hgb  A1c MFr Bld  Date/Time Value Ref Range Status  02/27/2019 02:15 AM 9.3 (H) 4.8 - 5.6 % Final    Comment:    (NOTE) Pre diabetes:          5.7%-6.4% Diabetes:              >6.4% Glycemic control for   <7.0% adults with diabetes   08/05/2010 05:10 AM (H) <5.7 % Final   8.0 (NOTE)                                                                       According to the ADA Clinical Practice Recommendations for 2011, when HbA1c is used as a screening test:   >=6.5%   Diagnostic of Diabetes Mellitus           (if abnormal result  is confirmed)  5.7-6.4%   Increased risk of developing Diabetes Mellitus  References:Diagnosis and Classification of Diabetes Mellitus,Diabetes NGEX,5284,13(KGMWN 1):S62-S69 and Standards of Medical Care in         Diabetes - 2011,Diabetes Care,2011,34  (Suppl 1):S11-S61.    CBG: Recent Labs  Lab 03/03/19 1557 03/03/19 1947 03/03/19 2322 03/04/19 0401 03/04/19 0814   GLUCAP 189* 119* 192* 311* 355*    Review of Systems/PMH/SH/FH:  Cannot obtain due to intubation  Allergies Allergies  Allergen Reactions  . Remdesivir Anaphylaxis     Home Medications  Prior to Admission medications   Medication Sig Start Date End Date Taking? Authorizing Provider  Dexlansoprazole (DEXILANT) 30 MG capsule Take 30 mg by mouth daily.   Yes [provider]  DULoxetine (CYMBALTA) 30 MG capsule Take 30 mg by mouth daily.   Yes [provider]  gabapentin (NEURONTIN) 300 MG capsule Take 300 mg by mouth 3 (three) times daily.    Yes [provider]  hydrochlorothiazide (HYDRODIURIL) 25 MG tablet Take 25 mg by mouth daily.   Yes [provider]  HYDROcodone-acetaminophen (NORCO/VICODIN) 5-325 MG tablet Take 1 tablet by mouth every 6 (six) hours as needed for moderate pain.   Yes [provider]  lisinopril (ZESTRIL) 20 MG tablet Take 20 mg by mouth daily.   Yes [provider]  lubiprostone (AMITIZA) 24 MCG capsule Take 24 mcg by mouth 2 (two) times daily with a meal.   Yes [provider]  metFORMIN (GLUCOPHAGE) 500 MG tablet Take 500 mg by mouth 2 (two) times daily.   Yes [provider]  naproxen sodium (ANAPROX) 220 MG tablet Take 440 mg by mouth 2 (two) times daily as needed (pain).   Yes [provider]  NOVOLIN 70/30 RELION (70-30) 100 UNIT/ML injection Inject 30 Units into the skin 2 (two) times daily with a meal.  11/21/16  Yes [provider]  pravastatin (PRAVACHOL) 80 MG tablet Take 80 mg by mouth at bedtime.   Yes [provider]  sucralfate (CARAFATE) 1 g tablet Take 1 g by mouth 4 (four) times daily -  with meals and at bedtime.   Yes [provider]  metoCLOPramide (REGLAN) 10 MG tablet Take 1 tablet (10 mg total) by mouth every 6 (six) hours. Patient not taking: Reported on 02/27/2019 05/08/17   Recardo Evangelist, PA-C  naproxen (NAPROSYN) 375 MG  tablet  Take 1 tablet (375 mg total) by mouth 2 (two) times daily. Patient not taking: Reported on 02/27/2019 01/31/18   Langston Masker B, PA-C  omeprazole (PRILOSEC) 20 MG capsule Take 1 capsule (20 mg total) by mouth 2 (two) times daily before a meal. Patient not taking: Reported on 02/27/2019 03/15/17   Ladene Artist, MD  RELION INSULIN SYR .3CC/29G 29G X 1/2" 0.3 ML MISC  11/16/16   [provider]     Critical care time: 80 minutes    Roselie Awkward, MD [place] [facility] Pager: [phone] Cell: [phone] If no response, call [phone]

## 2019-03-05 DIAGNOSIS — N179 Acute kidney failure, unspecified: Secondary | ICD-10-CM

## 2019-03-05 DIAGNOSIS — J9601 Acute respiratory failure with hypoxia: Secondary | ICD-10-CM

## 2019-03-05 DIAGNOSIS — E1122 Type 2 diabetes mellitus with diabetic chronic kidney disease: Secondary | ICD-10-CM

## 2019-03-05 DIAGNOSIS — Z794 Long term (current) use of insulin: Secondary | ICD-10-CM

## 2019-03-05 DIAGNOSIS — U071 COVID-19: Principal | ICD-10-CM

## 2019-03-05 DIAGNOSIS — N184 Chronic kidney disease, stage 4 (severe): Secondary | ICD-10-CM

## 2019-03-05 LAB — CBC
HCT: 21.8 % — ABNORMAL LOW (ref 36.0–46.0)
Hemoglobin: 6.3 g/dL — CL (ref 12.0–15.0)
MCH: 26.5 pg (ref 26.0–34.0)
MCHC: 28.9 g/dL — ABNORMAL LOW (ref 30.0–36.0)
MCV: 91.6 fL (ref 80.0–100.0)
Platelets: 198 10*3/uL (ref 150–400)
RBC: 2.38 MIL/uL — ABNORMAL LOW (ref 3.87–5.11)
RDW: 16.4 % — ABNORMAL HIGH (ref 11.5–15.5)
WBC: 21.1 10*3/uL — ABNORMAL HIGH (ref 4.0–10.5)
nRBC: 1 % — ABNORMAL HIGH (ref 0.0–0.2)

## 2019-03-05 LAB — POCT I-STAT 7, (LYTES, BLD GAS, ICA,H+H)
Acid-base deficit: 2 mmol/L (ref 0.0–2.0)
Bicarbonate: 23 mmol/L (ref 20.0–28.0)
Calcium, Ion: 1.12 mmol/L — ABNORMAL LOW (ref 1.15–1.40)
HCT: 21 % — ABNORMAL LOW (ref 36.0–46.0)
Hemoglobin: 7.1 g/dL — ABNORMAL LOW (ref 12.0–15.0)
O2 Saturation: 96 %
Patient temperature: 99.3
Potassium: 4.9 mmol/L (ref 3.5–5.1)
Sodium: 151 mmol/L — ABNORMAL HIGH (ref 135–145)
TCO2: 24 mmol/L (ref 22–32)
pCO2 arterial: 40.7 mmHg (ref 32.0–48.0)
pH, Arterial: 7.361 (ref 7.350–7.450)
pO2, Arterial: 87 mmHg (ref 83.0–108.0)

## 2019-03-05 LAB — MAGNESIUM
Magnesium: 1.9 mg/dL (ref 1.7–2.4)
Magnesium: 2.6 mg/dL — ABNORMAL HIGH (ref 1.7–2.4)

## 2019-03-05 LAB — PROCALCITONIN: Procalcitonin: 0.94 ng/mL

## 2019-03-05 LAB — COMPREHENSIVE METABOLIC PANEL
ALT: 53 U/L — ABNORMAL HIGH (ref 0–44)
AST: 65 U/L — ABNORMAL HIGH (ref 15–41)
Albumin: 2.4 g/dL — ABNORMAL LOW (ref 3.5–5.0)
Alkaline Phosphatase: 161 U/L — ABNORMAL HIGH (ref 38–126)
Anion gap: 11 (ref 5–15)
BUN: 92 mg/dL — ABNORMAL HIGH (ref 8–23)
CO2: 23 mmol/L (ref 22–32)
Calcium: 7.3 mg/dL — ABNORMAL LOW (ref 8.9–10.3)
Chloride: 117 mmol/L — ABNORMAL HIGH (ref 98–111)
Creatinine, Ser: 3.26 mg/dL — ABNORMAL HIGH (ref 0.44–1.00)
GFR calc Af Amer: 16 mL/min — ABNORMAL LOW (ref >60)
GFR calc non Af Amer: 14 mL/min — ABNORMAL LOW (ref >60)
Glucose, Bld: 220 mg/dL — ABNORMAL HIGH (ref 70–99)
Potassium: 4.7 mmol/L (ref 3.5–5.1)
Sodium: 151 mmol/L — ABNORMAL HIGH (ref 135–145)
Total Bilirubin: 0.4 mg/dL (ref 0.3–1.2)
Total Protein: 5.4 g/dL — ABNORMAL LOW (ref 6.5–8.1)

## 2019-03-05 LAB — C-REACTIVE PROTEIN: CRP: 1.1 mg/dL — ABNORMAL HIGH (ref <1.0)

## 2019-03-05 LAB — PREPARE RBC (CROSSMATCH)

## 2019-03-05 LAB — MRSA PCR SCREENING: MRSA by PCR: NEGATIVE

## 2019-03-05 LAB — GLUCOSE, CAPILLARY
Glucose-Capillary: 158 mg/dL — ABNORMAL HIGH (ref 70–99)
Glucose-Capillary: 172 mg/dL — ABNORMAL HIGH (ref 70–99)
Glucose-Capillary: 185 mg/dL — ABNORMAL HIGH (ref 70–99)
Glucose-Capillary: 196 mg/dL — ABNORMAL HIGH (ref 70–99)
Glucose-Capillary: 197 mg/dL — ABNORMAL HIGH (ref 70–99)
Glucose-Capillary: 207 mg/dL — ABNORMAL HIGH (ref 70–99)

## 2019-03-05 LAB — PHOSPHORUS
Phosphorus: 3.5 mg/dL (ref 2.5–4.6)
Phosphorus: 3.7 mg/dL (ref 2.5–4.6)

## 2019-03-05 LAB — HEPATIC FUNCTION PANEL
ALT: 52 U/L — ABNORMAL HIGH (ref 0–44)
AST: 51 U/L — ABNORMAL HIGH (ref 15–41)
Albumin: 2.6 g/dL — ABNORMAL LOW (ref 3.5–5.0)
Alkaline Phosphatase: 174 U/L — ABNORMAL HIGH (ref 38–126)
Bilirubin, Direct: 0.2 mg/dL (ref 0.0–0.2)
Indirect Bilirubin: 0.4 mg/dL (ref 0.3–0.9)
Total Bilirubin: 0.6 mg/dL (ref 0.3–1.2)
Total Protein: 6.1 g/dL — ABNORMAL LOW (ref 6.5–8.1)

## 2019-03-05 LAB — LACTATE DEHYDROGENASE: LDH: 603 U/L — ABNORMAL HIGH (ref 98–192)

## 2019-03-05 LAB — FERRITIN: Ferritin: 144 ng/mL (ref 11–307)

## 2019-03-05 LAB — HEMOGLOBIN AND HEMATOCRIT, BLOOD
HCT: 23.4 % — ABNORMAL LOW (ref 36.0–46.0)
Hemoglobin: 7.1 g/dL — ABNORMAL LOW (ref 12.0–15.0)

## 2019-03-05 LAB — BRAIN NATRIURETIC PEPTIDE: B Natriuretic Peptide: 151.7 pg/mL — ABNORMAL HIGH (ref 0.0–100.0)

## 2019-03-05 LAB — HEPARIN LEVEL (UNFRACTIONATED): Heparin Unfractionated: 0.91 IU/mL — ABNORMAL HIGH (ref 0.30–0.70)

## 2019-03-05 MED ORDER — MAGNESIUM SULFATE 2 GM/50ML IV SOLN
2.0000 g | Freq: Once | INTRAVENOUS | Status: AC
  Administered 2019-03-05: 2 g via INTRAVENOUS
  Filled 2019-03-05: qty 50

## 2019-03-05 MED ORDER — FREE WATER: 300.0000 mL | Freq: Four times a day (QID) | Status: DC

## 2019-03-05 MED ORDER — INSULIN ASPART 100 UNIT/ML ~~LOC~~ SOLN
0.0000 [IU] | SUBCUTANEOUS | Status: DC
  Administered 2019-03-05: 14:00:00 4 [IU] via SUBCUTANEOUS
  Administered 2019-03-05: 7 [IU] via SUBCUTANEOUS
  Administered 2019-03-05: 16:00:00 4 [IU] via SUBCUTANEOUS
  Administered 2019-03-05: 20:00:00 3 [IU] via SUBCUTANEOUS
  Administered 2019-03-06: 7 [IU] via SUBCUTANEOUS
  Administered 2019-03-06 (×3): 11 [IU] via SUBCUTANEOUS
  Administered 2019-03-06: 15 [IU] via SUBCUTANEOUS
  Administered 2019-03-06: 04:00:00 7 [IU] via SUBCUTANEOUS
  Administered 2019-03-07: 11 [IU] via SUBCUTANEOUS
  Administered 2019-03-07: 08:00:00 4 [IU] via SUBCUTANEOUS
  Administered 2019-03-07 (×2): 11 [IU] via SUBCUTANEOUS
  Administered 2019-03-07: 04:00:00 7 [IU] via SUBCUTANEOUS
  Administered 2019-03-07: 12:00:00 11 [IU] via SUBCUTANEOUS
  Administered 2019-03-08 (×3): 4 [IU] via SUBCUTANEOUS
  Administered 2019-03-08 – 2019-03-09 (×3): 7 [IU] via SUBCUTANEOUS
  Administered 2019-03-09: 20:00:00 11 [IU] via SUBCUTANEOUS
  Administered 2019-03-09 (×2): 4 [IU] via SUBCUTANEOUS
  Administered 2019-03-09: 04:00:00 3 [IU] via SUBCUTANEOUS
  Administered 2019-03-09: 15 [IU] via SUBCUTANEOUS
  Administered 2019-03-09: 4 [IU] via SUBCUTANEOUS
  Administered 2019-03-10: 08:00:00 15 [IU] via SUBCUTANEOUS
  Administered 2019-03-10 (×2): 11 [IU] via SUBCUTANEOUS
  Administered 2019-03-10: 04:00:00 15 [IU] via SUBCUTANEOUS
  Administered 2019-03-10 – 2019-03-11 (×2): 4 [IU] via SUBCUTANEOUS
  Administered 2019-03-11: 7 [IU] via SUBCUTANEOUS
  Administered 2019-03-11: 3 [IU] via SUBCUTANEOUS
  Administered 2019-03-11 (×2): 4 [IU] via SUBCUTANEOUS
  Administered 2019-03-11: 3 [IU] via SUBCUTANEOUS
  Administered 2019-03-12: 09:00:00 7 [IU] via SUBCUTANEOUS
  Administered 2019-03-12: 3 [IU] via SUBCUTANEOUS
  Administered 2019-03-12: 01:00:00 4 [IU] via SUBCUTANEOUS
  Administered 2019-03-12: 7 [IU] via SUBCUTANEOUS
  Administered 2019-03-12: 20:00:00 3 [IU] via SUBCUTANEOUS
  Administered 2019-03-12: 12:00:00 11 [IU] via SUBCUTANEOUS
  Administered 2019-03-12 – 2019-03-13 (×3): 4 [IU] via SUBCUTANEOUS
  Administered 2019-03-13: 12:00:00 7 [IU] via SUBCUTANEOUS
  Administered 2019-03-13 – 2019-03-14 (×8): 4 [IU] via SUBCUTANEOUS
  Administered 2019-03-14: 3 [IU] via SUBCUTANEOUS
  Administered 2019-03-15 (×6): 4 [IU] via SUBCUTANEOUS
  Administered 2019-03-16 (×2): 3 [IU] via SUBCUTANEOUS
  Administered 2019-03-16 (×2): 4 [IU] via SUBCUTANEOUS
  Administered 2019-03-17: 04:00:00 3 [IU] via SUBCUTANEOUS

## 2019-03-05 MED ORDER — FREE WATER
300.0000 mL | Status: DC
  Administered 2019-03-05 – 2019-03-15 (×59): 300 mL

## 2019-03-05 MED ORDER — INSULIN GLARGINE 100 UNIT/ML ~~LOC~~ SOLN
25.0000 [IU] | Freq: Two times a day (BID) | SUBCUTANEOUS | Status: DC
  Administered 2019-03-05 – 2019-03-09 (×9): 25 [IU] via SUBCUTANEOUS
  Filled 2019-03-05 (×10): qty 0.25

## 2019-03-05 MED ORDER — SODIUM CHLORIDE 0.9% IV SOLUTION: Freq: Once | INTRAVENOUS | Status: DC

## 2019-03-05 MED ORDER — CHLORHEXIDINE GLUCONATE CLOTH 2 % EX PADS
6.0000 | MEDICATED_PAD | Freq: Every day | CUTANEOUS | Status: DC
  Administered 2019-03-05 – 2019-03-11 (×7): 6 via TOPICAL

## 2019-03-05 MED ORDER — FERROUS SULFATE 300 (60 FE) MG/5ML PO SYRP
300.0000 mg | ORAL_SOLUTION | Freq: Two times a day (BID) | ORAL | Status: DC
  Administered 2019-03-05 – 2019-03-13 (×17): 300 mg
  Filled 2019-03-05 (×20): qty 5

## 2019-03-05 MED ORDER — HEPARIN SODIUM (PORCINE) 10000 UNIT/ML IJ SOLN
7500.0000 [IU] | Freq: Three times a day (TID) | INTRAMUSCULAR | Status: DC
  Administered 2019-03-05 – 2019-03-16 (×28): 7500 [IU] via SUBCUTANEOUS
  Filled 2019-03-05 (×28): qty 1

## 2019-03-05 MED ORDER — VITAL AF 1.2 CAL PO LIQD
1000.0000 mL | ORAL | Status: DC
  Administered 2019-03-06 – 2019-03-07 (×2): 1000 mL

## 2019-03-05 MED ORDER — FREE WATER
200.0000 mL | Freq: Four times a day (QID) | Status: DC
  Administered 2019-03-05: 10:00:00 200 mL

## 2019-03-05 NOTE — Progress Notes (Signed)
°                                  PROGRESS NOTE                                             °                                                                                                                     °                                         ° ° Patient Demographics:  ° ° Nichole Franklin, is a 68 y.o. female, DOB - 10/03/1950, MRN:[id number] ° °Outpatient Primary MD for the patient is [name], [name], MD    LOS - 7 ° °No chief complaint on file. °    ° °Brief Narrative: °Patient is a 68 y.o. female with PMHx of DM-2, HTN, dyslipidemia, CKD stage IV who presented to the hospital initially for evaluation of weakness, she was found to have fever and acute kidney injury.  She was subsequently admitted to [facility]-once her renal function stabilized-she was transferred to [facility].  Hospital course since admission has been complicated by encephalopathy, angioedema following initiation of Remdesivir-and respiratory failure requiring intubation on 7/4. ° ° Subjective:  ° ° Nichole Franklin today mains intubated and sedated.  Per RN-no major issues overnight. ° ° Assessment  & Plan :  ° °Acute hypoxemic respiratory failure secondary to COVID-19 and presumed superimposed bacterial infection: Continue full ventilator support-PCCM following and directing care.  Remains on empiric cefepime-no longer on Zyvox as MRSA PCR negative.  Procalcitonin has now down trended.  Given angioedema-no longer on Remdesivir.  Not given Actemra as concern for superimposed bacterial infection with elevated procalcitonin.  However remains on steroids.  Continue supportive care-we will follow closely for clinical improvement. ° °COVID-19 Labs: ° °Recent Labs  °  03/03/19 °0330 03/03/19 °0954 03/04/19 °0131 03/05/19 °0408  °FERRITIN  --  160 181 144  °LDH  --  890* 785* 603*  °CRP 1.5*  --  1.1* 1.1*  ° ° °Lab Results  °Component Value Date  ° SARSCOV2NAA POSITIVE (A) 02/12/2019  °   ° °COVID-19 Medications: ° °Vent Settings: °Vent Mode: PRVC °FiO2 (%):  [35 %-100 %] 35 % °Set Rate:  [24 bmp] 24 bmp °Vt Set:  [470 mL] 470 mL °PEEP:  [5 cmH20] 5 cmH20 °Plateau Pressure:  [14 cmH20-19 cmH20] 19 cmH20 ° °ABG: °   °Component Value Date/Time  ° PHART 7.361 03/05/2019 0836  ° PCO2ART 40.7 03/05/2019 0836  ° PO2ART 87.0 03/05/2019 0836  ° HCO3 23.0 03/05/2019 0836  °   ABG:    Component Value Date/Time   PHART 7.361 03/05/2019 0836   PCO2ART 40.7 03/05/2019 0836   PO2ART 87.0 03/05/2019 0836   HCO3 23.0 03/05/2019 0836   TCO2 24 03/05/2019 0836   ACIDBASEDEF 2.0 03/05/2019 0836   O2SAT 96.0 03/05/2019 0867    Acute metabolic encephalopathy: Suspected etiology felt to be worsening hypoxemia, underlying COVID/bacterial pneumonia and AKI.  CT head on 7/4 without any acute abnormalities.  Remains sedated with Precedex while on the ventilator.  Once sedation can be tapered down further-we will need to assess for improvement.  Angioedema: This occurred on 7/4-patient was started on Remdesivir on 7/3-plans are to continue steroids and other supportive care.  No longer on Remdesivir.  AKI on CKD stage IV: AKI thought to be hemodynamically mediated in the setting of COVID-19 infection-ACE/HCTZ use.  Slowly improving with supportive care.  Continue IV fluids.  Hypernatremia: Probably secondary to dehydration-increase free water to 300 cc every every 6 hours, will start half-normal saline.  DM-2 with uncontrolled hyperglycemia: Continue insulin infusion-once more stable-we can contemplate transitioning back to subcutaneous insulin.  HTN: Blood pressure better controlled today-continue amlodipine and labetalol  Anemia: No evidence of blood loss-no melena or hematochezia evident.  Suspect worsening anemia secondary to critical illness and IV fluid dilution in the background of CKD.  Will transfuse if hemoglobin less than 7-monitor closely for now.  Given severity of anemia-we will stop IV heparin and transition to   Elevated d-dimer: She was empirically started on IV heparin on 7/4 due to concern for VTE-however given drop in hemoglobin-we will go  ahead and change to prophylactic dose of heparin.  Unable to do a CT angios of the chest given advanced CKD/AKI-however will go ahead and Doppler her lower extremities.  Condition - Extremely Guarded/  Family Communication  :  Per PCCM  Code Status :  Full Code  Diet :  Diet Order            Diet NPO time specified Except for: Sips with Meds  Diet effective now               Disposition Plan  :  Remain inpatient-remain in ICU  Consults  :  PCCM  Procedures  :    ETT>>7/4 Left IJ>>7/4  GI prophylaxis: H2 Blocker  DVT Prophylaxis  : Prophylactic Heparin     Lab Results  Component Value Date   PLT 198 03/05/2019    Inpatient Medications  Scheduled Meds:  sodium chloride   Intravenous Once   amLODipine  10 mg Per Tube Daily   chlorhexidine gluconate (MEDLINE KIT)  15 mL Mouth Rinse BID   Chlorhexidine Gluconate Cloth  6 each Topical Q0600   cholecalciferol  1,000 Units Per Tube Daily   diphenhydrAMINE  50 mg Intravenous Q12H   feeding supplement (PRO-STAT SUGAR FREE 64)  30 mL Per Tube BID   feeding supplement (VITAL HIGH PROTEIN)  1,000 mL Per Tube Q24H   ferrous sulfate  300 mg Per Tube BID WC   free water  200 mL Per Tube Q6H   insulin aspart  0-9 Units Subcutaneous Q4H   insulin aspart  5 Units Subcutaneous Q4H   insulin glargine  35 Units Subcutaneous BID   labetalol  10 mg Intravenous Q8H   mouth rinse  15 mL Mouth Rinse 10 times per day   methylPREDNISolone (SOLU-MEDROL) injection  30 mg Intravenous Q12H   vitamin C  1,000 mg Per Tube Daily  zinc sulfate  220 mg Per Tube Daily   Continuous Infusions:  sodium chloride 10 mL/hr at 03/05/19 1000   ceFEPime (MAXIPIME) IV 1 g (03/05/19 1001)   dexmedetomidine (PRECEDEX) IV infusion 1.1 mcg/kg/hr (03/05/19 1000)   famotidine (PEPCID) IV Stopped (03/05/19 0956)   fentaNYL infusion INTRAVENOUS 75 mcg/hr (03/05/19 1000)   insulin Stopped (03/04/19 2204)   PRN Meds:.acetaminophen  **OR** acetaminophen, dextrose, EPINEPHrine, fentaNYL, haloperidol lactate, hydrALAZINE, midazolam, midazolam, ondansetron **OR** ondansetron (ZOFRAN) IV  Antibiotics  :    Anti-infectives (From admission, onward)   Start     Dose/Rate Route Frequency Ordered Stop   03/04/19 2200  ceFEPIme (MAXIPIME) 1 g in sodium chloride 0.9 % 100 mL IVPB     1 g 200 mL/hr over 30 Minutes Intravenous Every 12 hours 03/04/19 1256     03/04/19 1400  remdesivir 100 mg in sodium chloride 0.9 % 250 mL IVPB  Status:  Discontinued     100 mg 500 mL/hr over 30 Minutes Intravenous Every 24 hours 03/03/19 1305 03/04/19 0936   03/04/19 1200  linezolid (ZYVOX) IVPB 600 mg  Status:  Discontinued     600 mg 300 mL/hr over 60 Minutes Intravenous Every 12 hours 03/04/19 1141 03/05/19 1100   03/04/19 0900  linezolid (ZYVOX) IVPB 600 mg  Status:  Discontinued     600 mg 300 mL/hr over 60 Minutes Intravenous Every 12 hours 03/04/19 0759 03/04/19 1141   03/04/19 0800  ceFEPIme (MAXIPIME) 2 g in sodium chloride 0.9 % 100 mL IVPB  Status:  Discontinued     2 g 200 mL/hr over 30 Minutes Intravenous Every 12 hours 03/04/19 0706 03/04/19 1256   03/03/19 1400  remdesivir 200 mg in sodium chloride 0.9 % 250 mL IVPB     200 mg 500 mL/hr over 30 Minutes Intravenous Once 03/03/19 1305 03/03/19 1500   02/27/19 2000  azithromycin (ZITHROMAX) 500 mg in sodium chloride 0.9 % 250 mL IVPB  Status:  Discontinued     500 mg 250 mL/hr over 60 Minutes Intravenous Every 24 hours 02/27/19 0428 03/04/19 1211   02/27/19 0430  cefTRIAXone (ROCEPHIN) 1 g in sodium chloride 0.9 % 100 mL IVPB  Status:  Discontinued     1 g 200 mL/hr over 30 Minutes Intravenous Every 24 hours 02/27/19 0428 03/04/19 0706       Time Spent in minutes  45   Oren Binet M.D on 03/05/2019 at 11:58 AM  To page go to [email or web address] - use universal password  [facility] -  Office  [phone]  The patient is critically ill with multiple organ system  failure and requires high complexity decision making for assessment and support, frequent evaluation and titration of therapies, advanced monitoring, review of radiographic studies and interpretation of complex data.  Admit date - 02/24/2019    7    Objective:   Vitals:   03/05/19 0900 03/05/19 0927 03/05/19 1000 03/05/19 1029  BP: 137/68 137/68 129/65   Pulse: 62  63   Resp: (!) 24  (!) 24   Temp:      TempSrc:      SpO2: 100%  100% 100%  Weight:      Height:        Wt Readings from Last 3 Encounters:  03/04/19 74.6 kg  02/09/2019 72.6 kg  05/08/17 68 kg     Intake/Output Summary (Last 24 hours) at 03/05/2019 1158 Last data filed at 03/05/2019 1000 Gross per 24 hour  Intake 4513.13  ml  Output 2015 ml  Net 2498.13 ml     Physical Exam Gen Exam:Intubated, Sedated/Alert HEENT:atraumatic, normocephalic Chest: B/L clear to auscultation anteriorly CVS:S1S2 regular Abdomen:soft non tender, non distended Extremities:no edema Neurology: difficult to examine given sedation/intubation Skin: no rash   Data Review:    CBC Recent Labs  Lab 02/27/19 0215 02/28/19 0512 03/01/19 0413 03/02/19 0300 03/03/19 0954 03/04/19 0131 03/04/19 1100 03/05/19 0408 03/05/19 0700 03/05/19 0836  WBC 22.5* 24.3* 19.7* 18.7* 20.7* 23.2*  --  21.1*  --   --   HGB 9.1* 8.7* 8.1* 8.1* 8.0* 8.1* 7.8* 6.3* 7.1* 7.1*  HCT 29.4* 25.9* 24.7* 25.3* 25.9* 26.2* 23.0* 21.8* 23.4* 21.0*  PLT 315 329 273 256 246 256  --  198  --   --   MCV 87.2 80.9 82.6 84.9 86.6 87.0  --  91.6  --   --   MCH 27.0 27.2 27.1 27.2 26.8 26.9  --  26.5  --   --   MCHC 31.0 33.6 32.8 32.0 30.9 30.9  --  28.9*  --   --   RDW 16.9* 16.1* 16.1* 15.9* 15.9* 15.7*  --  16.4*  --   --   LYMPHSABS 1.6 1.8 1.4 1.8  --  1.7  --   --   --   --   MONOABS 0.0* 1.1* 1.1* 1.8*  --  1.9*  --   --   --   --   EOSABS 0.0 0.0 0.0 0.0  --  0.0  --   --   --   --   BASOSABS 0.0 0.0 0.0 0.0  --  0.1  --   --   --   --     Chemistries    Recent Labs  Lab 03/01/19 0413 03/02/19 0300 03/02/19 1505 03/03/19 0330 03/03/19 0954 03/04/19 0131 03/04/19 1100 03/04/19 1558 03/05/19 0408 03/05/19 0700 03/05/19 0836  NA 144 146* 146*  --  144 149* 150*  --  151*  --  151*  K 3.2* 3.0* 3.1*  --  3.4* 3.5 3.5  --  4.7  --  4.9  CL 98 100 102  --  103 107  --   --  117*  --   --   CO2 _0 --  25 25  --   --  23  --   --   GLUCOSE 197* 269* 289*  --  291* 230*  --   --  220*  --   --   BUN 83* 81* 81*  --  76* 79*  --   --  92*  --   --   CREATININE 4.88* 4.14* 3.91*  --  3.46* 3.23*  --   --  3.26*  --   --   CALCIUM 6.9* 6.8* 6.9*  --  7.1* 7.6*  --   --  7.3*  --   --   MG 2.1 2.0  --  2.0  --  2.1  --  1.9 1.9  --   --   AST 37 43*  --   --   --  104*  --   --  65* 51*  --   ALT 16 19  --   --   --  51*  --   --  53* 52*  --   ALKPHOS 133* 133*  --   --   --  197*  --   --  161* 174*  --  results for input(s): CHOL, HDL, LDLCALC, TRIG, CHOLHDL, LDLDIRECT in the last 72 hours. ° °Lab Results  °Component Value Date  ° HGBA1C 9.3 (H) 02/27/2019  ° °------------------------------------------------------------------------------------------------------------------ °No results for input(s): TSH, T4TOTAL, T3FREE, THYROIDAB in the last 72 hours. ° °Invalid input(s): FREET3 °------------------------------------------------------------------------------------------------------------------ °Recent Labs  °  03/04/19 °0131 03/05/19 °0408  °FERRITIN 181 144  ° ° °Coagulation profile °No results for input(s): INR, PROTIME in the last 168 hours. ° °No results for input(s): DDIMER in the last 72 hours. ° °Cardiac Enzymes °No results for input(s): CKMB, TROPONINI, MYOGLOBIN in the last 168 hours. ° °Invalid input(s):  CK °------------------------------------------------------------------------------------------------------------------ °   °Component Value Date/Time  ° BNP 151.7 (H) 03/05/2019 0409  ° ° °Micro Results °Recent Results (from the past 240 hour(s))  °SARS Coronavirus 2 (CEPHEID- Performed in [facility] hospital lab), Hosp Order     Status: Abnormal  ° Collection Time: 02/01/2019 12:47 PM  ° Specimen: Nasopharyngeal Swab  °Result Value Ref Range Status  ° SARS Coronavirus 2 POSITIVE (A) NEGATIVE Final  °  Comment: RESULT CALLED TO, READ BACK BY AND VERIFIED WITH: °KIM MAIN @1410 ON 02/11/2019 BY FMW °(NOTE) °If result is NEGATIVE °SARS-CoV-2 target nucleic acids are NOT DETECTED. °The SARS-CoV-2 RNA is generally detectable in upper and lower  °respiratory specimens during the acute phase of infection. The lowest  °concentration of SARS-CoV-2 viral copies this assay can detect is 250  °copies / mL. A negative result does not preclude SARS-CoV-2 infection  °and should not be used as the sole basis for treatment or other  °patient management decisions.  A negative result may occur with  °improper specimen collection / handling, submission of specimen other  °than nasopharyngeal swab, presence of viral mutation(s) within the  °areas targeted by this assay, and inadequate number of viral copies  °(<250 copies / mL). A negative result must be combined with clinical  °observations, patient history, and epidemiological information. °If result is POSITIVE °SARS-CoV-2 target nucleic acids are DETECTED. °T °he SARS-CoV-2 RNA is generally detectable in upper and lower  °respiratory specimens during the acute phase of infection.  Positive  °results are indicative of active infection with SARS-CoV-2.  Clinical  °correlation with patient history and other diagnostic information is  °necessary to determine patient infection status.  Positive results do  °not rule out bacterial infection or co-infection with other viruses. °If result is  PRESUMPTIVE POSTIVE °SARS-CoV-2 nucleic acids MAY BE PRESENT.   °A presumptive positive result was obtained on the submitted specimen  °and confirmed on repeat testing.  While 2019 novel coronavirus  °(SARS-CoV-2) nucleic acids may be present in the submitted sample  °additional confirmatory testing may be necessary for epidemiological  °and / or clinical management purposes  to differentiate between  °SARS-CoV-2 and other Sarbecovirus currently known to infect humans.  °If clinically indicated additional testing with an alternate test  °methodology ([id number]) is ° advised. The SARS-CoV-2 RNA is generally  °detectable in upper and lower respiratory specimens during the acute  °phase of infection. °The expected result is Negative. °Fact Sheet for Patients:  https://www.fda.gov/media/136312/download °Fact Sheet for Healthcare Providers: °https://www.fda.gov/media/136313/download °This test is not yet approved or cleared by the [place] FDA and °has been authorized for detection and/or diagnosis of SARS-CoV-2 by °FDA under an Emergency Use Authorization (EUA).  This EUA will remain °in effect (meaning this test can be used) for the duration of the °COVID-19 declaration under Section 564(b)(1) of the Act, 21 U.S.C. °section 360bbb-3(b)(1), unless   the authorization is terminated or °revoked sooner. °Performed at [facility], [address] [place], °[place] [address] °  °Culture, blood (Routine X 2) w Reflex to ID Panel     Status: None  ° Collection Time: 02/18/2019  3:54 PM  ° Specimen: BLOOD  °Result Value Ref Range Status  ° Specimen Description BLOOD BLOOD LEFT FOREARM  Final  ° Special Requests   Final  °  BOTTLES DRAWN AEROBIC AND ANAEROBIC Blood Culture results may not be optimal due to an excessive volume of blood received in culture bottles  ° Culture   Final  °  NO GROWTH 5 DAYS °Performed at [facility], [address] [place], [place] [address] °  ° Report Status 03/03/2019 FINAL   Final  °Culture, blood (Routine X 2) w Reflex to ID Panel     Status: None  ° Collection Time: 02/04/2019  3:54 PM  ° Specimen: BLOOD  °Result Value Ref Range Status  ° Specimen Description BLOOD LEFT ANTECUBITAL  Final  ° Special Requests   Final  °  BOTTLES DRAWN AEROBIC AND ANAEROBIC Blood Culture results may not be optimal due to an excessive volume of blood received in culture bottles  ° Culture   Final  °  NO GROWTH 5 DAYS °Performed at [facility], [address] [place], [place] [address] °  ° Report Status 03/03/2019 FINAL  Final  °Culture, respiratory (non-expectorated)     Status: None (Preliminary result)  ° Collection Time: 03/04/19 11:41 AM  ° Specimen: Tracheal Aspirate; Respiratory  °Result Value Ref Range Status  ° Specimen Description   Final  °  TRACHEAL ASPIRATE °Performed at [facility], [address] [place], [place] [address] °  ° Special Requests   Final  °  NONE °Performed at [facility], [address] [place], [place] [address] °  ° Gram Stain   Final  °  RARE WBC PRESENT, PREDOMINANTLY PMN °MODERATE SQUAMOUS EPITHELIAL CELLS PRESENT °ABUNDANT GRAM POSITIVE COCCI °Performed at [facility], [address] [place], [place] [address] °  ° Culture PENDING  Incomplete  ° Report Status PENDING  Incomplete  °MRSA PCR Screening     Status: None  ° Collection Time: 03/04/19  7:08 PM  ° Specimen: Nasal Mucosa; Nasopharyngeal  °Result Value Ref Range Status  ° MRSA by PCR NEGATIVE NEGATIVE Final  °  Comment:        °The GeneXpert MRSA Assay (FDA °approved for NASAL specimens °only), is one component of a °comprehensive MRSA colonization °surveillance program. It is not °intended to diagnose MRSA °infection nor to guide or °monitor treatment for °MRSA infections. °Performed at [facility], [address] [place],  [address] °  ° ° °Radiology Reports °Dg Abd 1 View ° °Result Date: 03/04/2019 °CLINICAL DATA:   Orogastric placement EXAM: ABDOMEN - 1 VIEW COMPARISON:  None. FINDINGS: Orogastric tube enters the stomach in has its tip in the fundus. Gas pattern is unremarkable. IMPRESSION: Orogastric tube tip in the fundus of the stomach. Electronically Signed   By: Mark  Shogry M.D.   On: 03/04/2019 11:40  ° °Ct Head Wo Contrast ° °Result Date: 03/04/2019 °CLINICAL DATA:  67-year-old female with COVID-19.  Confusion. EXAM: CT HEAD WITHOUT CONTRAST TECHNIQUE: Contiguous axial images were obtained from the base of the skull through the vertex without intravenous contrast. COMPARISON:  Cervical spine radiographs 08/07/2010. FINDINGS: Brain: No midline shift, mass effect,   images were obtained from the base of the skull through the vertex without intravenous contrast. COMPARISON:  Cervical spine radiographs 08/07/2010. FINDINGS: Brain: No midline shift, mass effect, or evidence of intracranial mass lesion. No ventriculomegaly. No acute intracranial hemorrhage identified. Patchy bilateral cerebral white matter hypodensity most pronounced at the frontal horns. Deep gray matter nuclei, brainstem and cerebellum appear negative. No cortical encephalomalacia. No cortically based acute infarct identified. Vascular: Calcified atherosclerosis at the skull base. No suspicious intracranial vascular hyperdensity. Skull: No acute osseous abnormality identified. Sinuses/Orbits: Trace sinus mucosal thickening, most pronounced in the right sphenoid. The tympanic cavities are clear. There are mild bilateral mastoid effusions. Other: The patient is intubated on the scout view. There is fluid in the visible pharynx. No acute orbit or scalp soft tissue findings. IMPRESSION: 1.  No acute intracranial abnormality. 2. Mild to moderate for age nonspecific white matter changes, most commonly due to chronic small vessel disease. Electronically Signed   By: Genevie Ann M.D.   On: 03/04/2019 18:15   Dg Chest Port 1 View  Result Date: 03/04/2019 CLINICAL DATA:  COVID-19 positive. Endotracheal and enteric tube placement. EXAM: PORTABLE CHEST 1 VIEW COMPARISON:  Chest x-ray dated February 28, 2019. FINDINGS: Interval placement of an endotracheal tube with the  tip 3.0 cm above the carina. New enteric tube entering the stomach with the tip in the fundus. New left internal jugular central venous catheter with tip in the proximal SVC. Stable cardiomediastinal silhouette. Normal pulmonary vascularity. Patchy bilateral airspace disease has improved. New left basilar atelectasis. No pleural effusion or pneumothorax. No acute osseous abnormality. IMPRESSION: 1. Appropriately positioned endotracheal and enteric tubes. 2. Improving COVID-19 pneumonia. Electronically Signed   By: Titus Dubin M.D.   On: 03/04/2019 11:42   Dg Chest Port 1 View  Result Date: 02/28/2019 CLINICAL DATA:  Hypoxia, COVID-19 EXAM: PORTABLE CHEST 1 VIEW COMPARISON:  Portable exam 0711 hours compared to 02/03/2019 FINDINGS: Stable heart size mediastinal contours. Patchy airspace infiltrates bilaterally, LEFT greater than RIGHT, consistent with multifocal pneumonia and diagnosis of COVID-19. Infiltrates appear mildly increased since previous exam. No pleural effusion or pneumothorax. Bones demineralized. IMPRESSION: BILATERAL airspace infiltrates consistent with pneumonia and diagnosis of COVID-19, mildly increased since previous exam. Electronically Signed   By: Lavonia Dana M.D.   On: 02/28/2019 07:38   Dg Chest Port 1 View  Result Date: 02/25/2019 CLINICAL DATA:  Shortness of breath and cough. EXAM: PORTABLE CHEST 1 VIEW COMPARISON:  02/17/2011 FINDINGS: Patchy bilateral pulmonary opacity with interstitial coarsening. Normal heart size for technique. No effusion or pneumothorax. IMPRESSION: Patchy bilateral infiltrate concerning for pneumonia-especially COVID-19. Electronically Signed   By: Monte Fantasia M.D.   On: 02/27/2019 13:04   Ct Renal Stone Study  Result Date: 02/15/2019 CLINICAL DATA:  Shortness of breath, dry cough, COVID-19, renal failure EXAM: CT ABDOMEN AND PELVIS WITHOUT CONTRAST TECHNIQUE: Multidetector CT imaging of the abdomen and pelvis was performed following the standard  protocol without IV contrast. COMPARISON:  05/08/2017 FINDINGS: Lower chest: Extensive, multifocal, predominantly subpleural ground-glass pulmonary opacity of the bilateral lung bases. Hepatobiliary: No focal liver abnormality is seen. Status post cholecystectomy. No biliary dilatation. Pancreas: Unremarkable. No pancreatic ductal dilatation or surrounding inflammatory changes. Spleen: Normal in size without significant abnormality. Adrenals/Urinary Tract: Adrenal glands are unremarkable. Kidneys are normal, without renal calculi, solid lesion, or hydronephrosis. Bladder is unremarkable. Stomach/Bowel: Stomach is within normal limits. Appendix appears normal. No evidence of bowel wall thickening, distention, or inflammatory changes. Colonic diverticulosis. Vascular/Lymphatic: Aortic atherosclerosis.  deformities of the T11 and T12 vertebral bodies, new compared to prior examination. IMPRESSION: 1. No acute noncontrast CT findings of the abdomen or pelvis to explain renal failure. No evidence of urinary tract calculus or hydronephrosis. 2. Extensive, multifocal, predominantly subpleural ground-glass pulmonary opacity of the bilateral lung bases, findings in keeping with reported diagnosis of COVID-19. 3. There are partially sclerotic inferior endplate deformities of the T11 and T12 vertebral bodies, new compared to prior examination, although of uncertain acuity. Correlate for acute pain and point tenderness. 4. Other chronic, incidental, and postoperative findings as detailed above. Electronically Signed   By: Alex  Bibbey M.D.   On: 02/15/2019 14:58  ° ° °

## 2019-03-05 NOTE — Progress Notes (Signed)
ANTICOAGULATION CONSULT NOTE - Follow-up Consult  Pharmacy Consult for Heparin Indication: r/o VTE  Allergies  Allergen Reactions  . Remdesivir Anaphylaxis    Patient Measurements: Height: 5\' 6"  (167.6 cm) Weight: 164 lb 7.4 oz (74.6 kg) IBW/kg (Calculated) : 59.3 Heparin Dosing Weight: TBW  Vital Signs: Temp: 99.7 F (37.6 C) (07/05 0100) Temp Source: Oral (07/05 0100) BP: 165/66 (07/05 0600) Pulse Rate: 76 (07/05 0600)  Labs: Recent Labs    03/02/19 1505  03/03/19 0954 03/04/19 0131 03/04/19 1100 03/04/19 1558 03/05/19 0409  HGB  --    < > 8.0* 8.1* 7.8*  --   --   HCT  --   --  25.9* 26.2* 23.0*  --   --   PLT  --   --  246 256  --   --   --   HEPARINUNFRC  --   --   --   --   --   --  0.91*  CREATININE 3.91*  --  3.46* 3.23*  --   --   --   CKTOTAL  --   --   --   --   --  374*  --   TROPONINIHS  --   --   --   --   --  61*  --    < > = values in this interval not displayed.    Estimated Creatinine Clearance: 17.2 mL/min (A) (by C-G formula based on SCr of 3.23 mg/dL (H)).   Assessment: 28 yoF admitted on 6/28 with COVID pneumonia, AKI.  Today she is transferred to ICU with acute change in status.  Possible VTE event, pharmacy is consulted to dose Heparin IV.  Doppler pending.  Unable to obtain CTa d/t AKI. No PTA anticoagulation, She was briefly on inpatient VTE prophylaxis with Heparin SQ from 6/29-7/1 then d/c for anemia. CBC:  Hgb 9 on admission, decreased but stable ~8 since 7/1.  Heparin level supratherapeutic (0.91) on gtt at 1200 units/hr. No bleeding noted per RN. CBC still pending.  Goal of Therapy:  Heparin level 0.3-0.7 units/ml Monitor platelets by anticoagulation protocol: Yes   Plan:   Decrease heparin IV infusion to 1050 units/hr  Heparin level in 8 hours  Daily heparin level and CBC  Continue to monitor H&H and platelets   Sherlon Handing, PharmD, BCPS Clinical pharmacist 03/05/2019 6:15 AM

## 2019-03-05 NOTE — Progress Notes (Signed)
Daughter Eboni called. No answer.  Daughter [name] was called and updated. All questions are answered.

## 2019-03-05 NOTE — Progress Notes (Signed)
SLP Cancellation Note  Patient Details Name: Eloise Picone MRN: [id number] DOB: Dec 21, 1950   Cancelled treatment:       Reason Eval/Treat Not Completed: Medical issues which prohibited therapy. Pt now intubated in ICU. Will sign off at this time.   Herbie Baltimore, MA [place]  Acute [facility] Services Pager [phone] Office [phone]  Lynann Beaver 03/05/2019, 7:38 AM

## 2019-03-05 NOTE — Progress Notes (Signed)
NAME:  Nichole Franklin, MRN:  [id number], DOB:  [birth year], LOS: 7 ADMISSION DATE:  02/13/2019, CONSULTATION DATE:  7/4 REFERRING MD:  Aileen Fass, CHIEF COMPLAINT:  Found confused, tongue swelling  Brief History   This is a 68 year old female with an extensive past medical history who was admitted on February 26, 2019 with acute kidney injury weakness and diarrhea in the setting of a new COVID-19 diagnosis.  She was admitted to the [facility] due to her renal failure which improved with IV fluids.  She was transferred to [facility].  On hospital day 6 she developed worsening confusion and tongue swelling after - severe and was transferred to the ICU emergently for intubation.  History of present illness   No further history could be obtained, refer to brief history above  Past Medical History  Hypertension Hyperlipidemia Diabetes mellitus Gastroesophageal reflux disease History of cervical cancer Anxiety  Significant Hospital Events   June 28 admission [facility] from [facility] for AKI, poor po intake June 30 transfer to [facility], confusion July 2: worsening hypoxemia July 3 remdesivir, worsening confusion July 4 tongue swelling, hypertension, severe encephalopathy, transfer to ICU, intubation, new worsening fever - Admitted to the ICU for worsening confusion, required emergent intubation Tongue swelling noted   Consults:  Nephrology, signed off PCCM  Procedures:  July 4 left IJ CVL July 4 endotracheal tube  Significant Diagnostic Tests:  June 28 CT scan abdomen pelvis for nephrolithiasis: No explanation of renal failure, no urinary calculus, extensive multifocal groundglass opacity in lungs, sclerotic inferior endplate deformities of T11 and T12 July 4 CT Head > pending  Micro Data:  7/4 resp culture  Antimicrobials:  June 28 ceftriaxone and azithromycin through July 3 July 4 cefepime, linezolid July 3 remdesivir  Interim history/subjective:    [id number] -intubated  yesterday secondary to angioedema thought secondary to antiviral Remdesivir.  Currently on Precedex gtt and fent gt  No overnight issues.  On 35% oxygen and PEEP of 5 on the ventilator with a pulse ox of 100%.  Not on pressors.  Making urine.  Hemoglobin dropped to 6.3 but recheck was 7.1 g%.  No obvious bleeding.  Is on IV heparin drip for empiric PE. Cannot get CTA due to low gfr  Objective   Blood pressure 135/63, pulse 66, temperature 99.3 F (37.4 C), temperature source Oral, resp. rate (!) 24, height 5\' 6"  (1.676 m), weight 74.6 kg, SpO2 99 %. CVP:  [0 mmHg-9 mmHg] 9 mmHg  Vent Mode: PRVC FiO2 (%):  [35 %-100 %] 35 % Set Rate:  [24 bmp] 24 bmp Vt Set:  [470 mL] 470 mL PEEP:  [5 cmH20] 5 cmH20 Plateau Pressure:  [14 cmH20-20 cmH20] 20 cmH20   Intake/Output Summary (Last 24 hours) at 03/05/2019 1444 Last data filed at 03/05/2019 1400 Gross per 24 hour  Intake 4747.97 ml  Output 1240 ml  Net 3507.97 ml   Filed Weights   03/01/19 0430 03/03/19 0500 03/04/19 1030  Weight: 126.4 kg 126.4 kg 74.6 kg    General Appearance:  Looks criticall ill OBESE - + Head:  Normocephalic, without obvious abnormality, atraumatic Eyes:  PERRL - yes, conjunctiva/corneas - muddy     Ears:  Normal external ear canals, both ears Nose:  G tube - no Throat:  ETT TUBE - yes , OG tube - yes Neck:  Supple,  No enlargement/tenderness/nodules Lungs: Clear to auscultation bilaterally, Ventilator   Synchrony - yes Heart:  S1 and S2 normal, no murmur, CVP -  no.  Pressors - no Abdomen:  Soft, no masses, no organomegaly Genitalia / Rectal:  Not done Extremities:  Extremities- inact Skin:  ntact in exposed areas . Sacral area - not examined Neurologic:  Sedation - precedex -> RASS - -3     LABS    PULMONARY Recent Labs  Lab 02/28/19 0747 03/04/19 1100 03/05/19 0836  PHART 7.414 7.430 7.361  PCO2ART 28.7* 36.0 40.7  PO2ART 56.1* 270.0* 87.0  HCO3 18.2* 23.8 23.0  TCO2  --  25 24  O2SAT 88.7 100.0  96.0    CBC Recent Labs  Lab 03/03/19 0954 03/04/19 0131  03/05/19 0408 03/05/19 0700 03/05/19 0836  HGB 8.0* 8.1*   < > 6.3* 7.1* 7.1*  HCT 25.9* 26.2*   < > 21.8* 23.4* 21.0*  WBC 20.7* 23.2*  --  21.1*  --   --   PLT 246 256  --  198  --   --    < > = values in this interval not displayed.    COAGULATION No results for input(s): INR in the last 168 hours.  CARDIAC  No results for input(s): TROPONINI in the last 168 hours. No results for input(s): PROBNP in the last 168 hours.   CHEMISTRY Recent Labs  Lab 03/02/19 0300 03/02/19 1505 03/03/19 0330 03/03/19 0954 03/04/19 0131 03/04/19 1100 03/04/19 1558 03/05/19 0408 03/05/19 0836  NA 146* 146*  --  144 149* 150*  --  151* 151*  K 3.0* 3.1*  --  3.4* 3.5 3.5  --  4.7 4.9  CL 100 102  --  103 107  --   --  117*  --   CO2 27 29  --  25 25  --   --  23  --   GLUCOSE 269* 289*  --  291* 230*  --   --  220*  --   BUN 81* 81*  --  76* 79*  --   --  92*  --   CREATININE 4.14* 3.91*  --  3.46* 3.23*  --   --  3.26*  --   CALCIUM 6.8* 6.9*  --  7.1* 7.6*  --   --  7.3*  --   MG 2.0  --  2.0  --  2.1  --  1.9 1.9  --   PHOS  --   --   --   --   --   --  4.3 3.5  --    Estimated Creatinine Clearance: 17.1 mL/min (A) (by C-G formula based on SCr of 3.26 mg/dL (H)).   LIVER Recent Labs  Lab 03/01/19 0413 03/02/19 0300 03/04/19 0131 03/05/19 0408 03/05/19 0700  AST 37 43* 104* 65* 51*  ALT 16 19 51* 53* 52*  ALKPHOS 133* 133* 197* 161* 174*  BILITOT 0.2* 0.4 0.9 0.4 0.6  PROT 6.9 6.9 7.0 5.4* 6.1*  ALBUMIN 2.7* 2.7* 2.9* 2.4* 2.6*     INFECTIOUS Recent Labs  Lab 02/04/2019 1553 03/01/19 0413 03/05/19 0408  LATICACIDVEN 1.7  --   --   PROCALCITON  --  3.36 0.94     ENDOCRINE CBG (last 3)  Recent Labs    03/05/19 0407 03/05/19 0733 03/05/19 1346  GLUCAP 196* 158* 197*         IMAGING x48h  - image(s) personally visualized  -   highlighted in bold Dg Abd 1 View  Result Date: 03/04/2019  CLINICAL DATA:  Orogastric placement EXAM: ABDOMEN - 1 VIEW COMPARISON:  None. FINDINGS: Orogastric tube enters the stomach in has its tip in the fundus. Gas pattern is unremarkable. IMPRESSION: Orogastric tube tip in the fundus of the stomach. Electronically Signed   By: Nelson Chimes M.D.   On: 03/04/2019 11:40   Ct Head Wo Contrast  Result Date: 03/04/2019 CLINICAL DATA:  68 year old female with COVID-19.  Confusion. EXAM: CT HEAD WITHOUT CONTRAST TECHNIQUE: Contiguous axial images were obtained from the base of the skull through the vertex without intravenous contrast. COMPARISON:  Cervical spine radiographs 08/07/2010. FINDINGS: Brain: No midline shift, mass effect, or evidence of intracranial mass lesion. No ventriculomegaly. No acute intracranial hemorrhage identified. Patchy bilateral cerebral white matter hypodensity most pronounced at the frontal horns. Deep gray matter nuclei, brainstem and cerebellum appear negative. No cortical encephalomalacia. No cortically based acute infarct identified. Vascular: Calcified atherosclerosis at the skull base. No suspicious intracranial vascular hyperdensity. Skull: No acute osseous abnormality identified. Sinuses/Orbits: Trace sinus mucosal thickening, most pronounced in the right sphenoid. The tympanic cavities are clear. There are mild bilateral mastoid effusions. Other: The patient is intubated on the scout view. There is fluid in the visible pharynx. No acute orbit or scalp soft tissue findings. IMPRESSION: 1.  No acute intracranial abnormality. 2. Mild to moderate for age nonspecific white matter changes, most commonly due to chronic small vessel disease. Electronically Signed   By: Genevie Ann M.D.   On: 03/04/2019 18:15   Dg Chest Port 1 View  Result Date: 03/04/2019 CLINICAL DATA:  COVID-19 positive. Endotracheal and enteric tube placement. EXAM: PORTABLE CHEST 1 VIEW COMPARISON:  Chest x-ray dated February 28, 2019. FINDINGS: Interval placement of an  endotracheal tube with the tip 3.0 cm above the carina. New enteric tube entering the stomach with the tip in the fundus. New left internal jugular central venous catheter with tip in the proximal SVC. Stable cardiomediastinal silhouette. Normal pulmonary vascularity. Patchy bilateral airspace disease has improved. New left basilar atelectasis. No pleural effusion or pneumothorax. No acute osseous abnormality. IMPRESSION: 1. Appropriately positioned endotracheal and enteric tubes. 2. Improving COVID-19 pneumonia. Electronically Signed   By: Titus Dubin M.D.   On: 03/04/2019 11:42     Resolved Hospital Problem list      Assessment & Plan:  Acute encephalopathy: Unclear etiology, could be related to steroids, hospital delirium in setting of multiple metabolic derangements, less likely press syndrome or related to direct neurotoxicity from COVID. Ct head 7/4 unremarkable   03/05/2019 - maintans on precedex and fent gtt  PLAN Control blood pressure precedex   Worsening acute respiratory failure with hypoxemia in setting of COVID-19 pneumonia: Pulmonary embolism? Continue Solu-Medrol Mechanical ventilation via ARDS protocol  `- Target tidal volume 6 to 8 cc/kg ideal body weight  - Target plateau pressure less than 30 cm of water  - Target driving pressure less than 15 cm of water Ventilator associated pneumonia prevention protocol Change  IV heparin gtt to SQ dose proph dose given drop iin hgb though no over bleeidng Await LE doppler Monitor carefully for bleeding NO REMDESVIRI (allergy)  Anemia  hgb 7.1 No overt bleeding. Likely due to critical illness  plAN  - DC Iv heparin gtt  - - PRBC for hgb </= 6.9gm%    - exceptions are   -  if ACS susepcted/confirmed then transfuse for hgb </= 8.0gm%,  or    -  active bleeding with hemodynamic instability, then transfuse regardless of hemoglobin value   At at all times try to transfuse 1 unit  prbc as possible with exception of active  hemorrhage   Need for sedation for mechanical ventilation: Precedex infusion, fentanyl and Versed as needed for synchrony Hold quetiapine considering QT prolongation  QT prolongation: Hold quetiapine Hold azithromycin Recheck EKG 03/06/19   Worsening fever 03/04/2019 , hypoxemia: Most likely due to COVID-19, however consider healthcare associated pneumonia. MRSA PCR neg 03/04/2019 . PCT 3 and improved c/w bacterial pna  Plan  - dc [place]  - continue cefpeime   Angioedema: No anaphylaxis, concern for reaction to remdesivir though timing not classic for allergic reaction Hold further doses of remdesivir Continue Solu-Medrol Supportive care  Intravascular dehydration  - Rising BUN and rising Na - slowly improved creat  Plan  - increase free water  Best practice:  Diet: continue  tube feeding Pain/Anxiety/Delirium protocol (if indicated): yes, RASS target -1 to -2 VAP protocol (if indicated): yes DVT prophylaxis: lovenox GI prophylaxis: famotidine Glucose control: SSI Mobility: bed rest Code Status: full Family Communication: updated her daughter [name] by phone on 7/4 . On 03/05/2019  -daughter [name] on phone Disposition: remain in [facility]   The patient Nichole Franklin is critically ill with multiple organ systems failure and requires high complexity decision making for assessment and support, frequent evaluation and titration of therapies, application of advanced monitoring technologies and extensive interpretation of multiple databases.   Critical Care Time devoted to patient care services described in this note is  30  Minutes. This time reflects time of care of this signee Dr Brand Males. This critical care time does not reflect procedure time, or teaching time or supervisory time of PA/NP/Med student/Med Resident etc but could involve care discussion time     Dr. Brand Males, M.D., [facility]C.P Pulmonary and Critical Care  Medicine Staff Physician [place] [facility] Pager: [phone], If no answer or between  15:00h - 7:00h: call [phone]  03/05/2019 2:44 PM

## 2019-03-05 NOTE — Progress Notes (Signed)
Initial Nutrition Assessment  RD working remotely.   DOCUMENTATION CODES:   Not applicable  INTERVENTION:  - will adjust TF regimen: Vital AF 1.2 @ 50 ml/hr with 30 ml prostat BID. - this regimen will provide 1640 kcal, 120 grams protein, and 973 ml free water. - new bottle of Vital High Protein was hung ~2 hours ago, so will start new TF regimen 7/6 at 9:00 AM in order to increase nutrition provision while being mindful of TF waste. - free water flush to continue to be per CCM.   NUTRITION DIAGNOSIS:   Inadequate oral intake related to inability to eat as evidenced by NPO status.  GOAL:   Patient will meet greater than or equal to 90% of their needs  MONITOR:   Vent status, TF tolerance, Labs, Weight trends  REASON FOR ASSESSMENT:   Ventilator, Consult Enteral/tube feeding initiation and management  ASSESSMENT:   68 year-old female with medical history of type 2 DM, HTN, dyslipidemia, and CKD stage 4 who presented to the hospital initially for evaluation of weakness. She was then found to have fever and AKI and subsequently admitted to [facility], where she was found to be COVID-19 positive. Once her renal function stabilized, she was transferred to [facility] course since admission has been complicated by encephalopathy, angioedema following initiation of Remdesivir-and respiratory failure requiring intubation on 7/4.  Current weight is 164 lb and weight on admission (6/28) was 169 lb. PTA the most recent weight was from 05/08/17 when she weighed 150 lb. Used current weight in estimating needs; will continue to monitor and adjust if needed.   Patient remains intubated since yesterday. OGT was placed yesterday at ~10:40 AM and TF protocol then started at noon: Vital High Protein @ 40 ml/hr with 30 ml prostat BID. This regimen provides 1160 kcal, 114 grams protein, and 802 ml free water. Order in place for 300 ml free water QID (1200 ml/day) to start  today at 4 PM.   Per notes: - tongue swelling, HTN, and severe encephalopathy leading to transfer to ICU and emergent intubation on 7/4 - CT head 7/4--pending results - remdesivir was given on 7/3--tongue swelling occurred after this, no plan for further doses d/t allergy - unable to obtain CT abdomen d/t low GFR value (16 ml/min) - anemia with Hgb of 7.1 thought to be 2/2 critical illness as no overt bleeding noted   Patient is currently intubated on ventilator support MV: 11.3 L/min Temp (24hrs), Avg:99.3 F (37.4 C), Min:99 F (37.2 C), Max:99.7 F (37.6 C) Propofol: none  Medications reviewed; 1000 units vitamin D3 per OGT/day, 20 mg IV pepcid/day, 300 mg ferrous sulfate per OGT BID, sliding scale novolog, 20 units novolog x1 dose 7/4, 25 units lantus BID starting today, 2 g IV Mg sulfate x1 run 7/5, 30 mg solu-medrol BID, 20 mEq KCl per OGT x2 on 7/4, 1000 mcg ascorbic acid per OGT/day, 220 mg zinc sulfate per OGT/day, 50 mg benadryl per OGT BID.  Labs reviewed; CBGs: 207, 196, 158, and 197 mg/dl today, Na: 151 mmol/l, Cl: 117 mmol/l, ionized Ca: 1.12 mmol/l, Alk Phos elevated and up from yesterday, LFTs slightly elevated and down from yesterday, GFR: 16. IVF; 1/2 NS @ 60 ml/hr.  Drips; precedex @ 1.1 mcg/kg/hr, fentanyl @ 75 mcg/hr     NUTRITION - FOCUSED PHYSICAL EXAM:  unable to complete while patient is at [facility].  Diet Order:   Diet Order  Diet NPO time specified Except for: Sips with Meds  Diet effective now              EDUCATION NEEDS:   Not appropriate for education at this time  Skin:  Skin Assessment: Reviewed RN Assessment  Last BM:  PTA/unknown  Height:   Ht Readings from Last 1 Encounters:  02/02/2019 '5\' 6"'$  (1.676 m)    Weight:   Wt Readings from Last 1 Encounters:  03/04/19 74.6 kg    Ideal Body Weight:  59.1 kg  BMI:  Body mass index is 26.55 kg/m.  Estimated Nutritional Needs:   Kcal:  1663 kcal  Protein:  112-127 grams  (1.5-1.7 grams/kg)  Fluid:  >/= 1.8 L/day      Jarome Matin, MS, RD, LDN, [facility] Inpatient Clinical Dietitian Pager # [phone] After hours/weekend pager # [phone]

## 2019-03-06 ENCOUNTER — Inpatient Hospital Stay (HOSPITAL_COMMUNITY): Payer: Medicare HMO

## 2019-03-06 DIAGNOSIS — R609 Edema, unspecified: Secondary | ICD-10-CM

## 2019-03-06 DIAGNOSIS — U071 COVID-19: Secondary | ICD-10-CM

## 2019-03-06 DIAGNOSIS — R509 Fever, unspecified: Secondary | ICD-10-CM

## 2019-03-06 DIAGNOSIS — M7989 Other specified soft tissue disorders: Secondary | ICD-10-CM

## 2019-03-06 LAB — CBC
HCT: 23.3 % — ABNORMAL LOW (ref 36.0–46.0)
HCT: 27.4 % — ABNORMAL LOW (ref 36.0–46.0)
Hemoglobin: 6.7 g/dL — CL (ref 12.0–15.0)
Hemoglobin: 8.5 g/dL — ABNORMAL LOW (ref 12.0–15.0)
MCH: 26.5 pg (ref 26.0–34.0)
MCH: 28.5 pg (ref 26.0–34.0)
MCHC: 28.8 g/dL — ABNORMAL LOW (ref 30.0–36.0)
MCHC: 31 g/dL (ref 30.0–36.0)
MCV: 92.1 fL (ref 80.0–100.0)
MCV: 91.9 fL (ref 80.0–100.0)
Platelets: 192 10*3/uL (ref 150–400)
Platelets: 188 10*3/uL (ref 150–400)
RBC: 2.53 MIL/uL — ABNORMAL LOW (ref 3.87–5.11)
RBC: 2.98 MIL/uL — ABNORMAL LOW (ref 3.87–5.11)
RDW: 16.3 % — ABNORMAL HIGH (ref 11.5–15.5)
RDW: 15.4 % (ref 11.5–15.5)
WBC: 33 10*3/uL — ABNORMAL HIGH (ref 4.0–10.5)
WBC: 37 10*3/uL — ABNORMAL HIGH (ref 4.0–10.5)
nRBC: 0.5 % — ABNORMAL HIGH (ref 0.0–0.2)
nRBC: 0.6 % — ABNORMAL HIGH (ref 0.0–0.2)

## 2019-03-06 LAB — COMPREHENSIVE METABOLIC PANEL
ALT: 44 U/L (ref 0–44)
AST: 44 U/L — ABNORMAL HIGH (ref 15–41)
Albumin: 2.5 g/dL — ABNORMAL LOW (ref 3.5–5.0)
Alkaline Phosphatase: 154 U/L — ABNORMAL HIGH (ref 38–126)
Anion gap: 9 (ref 5–15)
BUN: 100 mg/dL — ABNORMAL HIGH (ref 8–23)
CO2: 24 mmol/L (ref 22–32)
Calcium: 7.7 mg/dL — ABNORMAL LOW (ref 8.9–10.3)
Chloride: 113 mmol/L — ABNORMAL HIGH (ref 98–111)
Creatinine, Ser: 3.21 mg/dL — ABNORMAL HIGH (ref 0.44–1.00)
GFR calc Af Amer: 16 mL/min — ABNORMAL LOW (ref >60)
GFR calc non Af Amer: 14 mL/min — ABNORMAL LOW (ref >60)
Glucose, Bld: 260 mg/dL — ABNORMAL HIGH (ref 70–99)
Potassium: 4.3 mmol/L (ref 3.5–5.1)
Sodium: 146 mmol/L — ABNORMAL HIGH (ref 135–145)
Total Bilirubin: 0.3 mg/dL (ref 0.3–1.2)
Total Protein: 5.9 g/dL — ABNORMAL LOW (ref 6.5–8.1)

## 2019-03-06 LAB — LACTATE DEHYDROGENASE: LDH: 537 U/L — ABNORMAL HIGH (ref 98–192)

## 2019-03-06 LAB — C-REACTIVE PROTEIN: CRP: 1.1 mg/dL — ABNORMAL HIGH (ref <1.0)

## 2019-03-06 LAB — GLUCOSE, CAPILLARY
Glucose-Capillary: 208 mg/dL — ABNORMAL HIGH (ref 70–99)
Glucose-Capillary: 213 mg/dL — ABNORMAL HIGH (ref 70–99)
Glucose-Capillary: 232 mg/dL — ABNORMAL HIGH (ref 70–99)
Glucose-Capillary: 251 mg/dL — ABNORMAL HIGH (ref 70–99)
Glucose-Capillary: 263 mg/dL — ABNORMAL HIGH (ref 70–99)
Glucose-Capillary: 283 mg/dL — ABNORMAL HIGH (ref 70–99)

## 2019-03-06 LAB — PREPARE RBC (CROSSMATCH)

## 2019-03-06 LAB — FERRITIN: Ferritin: 145 ng/mL (ref 11–307)

## 2019-03-06 MED ORDER — DIPHENHYDRAMINE HCL 50 MG/ML IJ SOLN
25.0000 mg | Freq: Once | INTRAMUSCULAR | Status: AC
  Administered 2019-03-06: 13:00:00 25 mg via INTRAVENOUS
  Filled 2019-03-06: qty 1

## 2019-03-06 MED ORDER — ACETAMINOPHEN 325 MG PO TABS
650.0000 mg | ORAL_TABLET | Freq: Once | ORAL | Status: AC
  Administered 2019-03-06: 12:00:00 650 mg via ORAL
  Filled 2019-03-06: qty 2

## 2019-03-06 MED ORDER — FENTANYL CITRATE (PF) 100 MCG/2ML IJ SOLN
50.0000 ug | INTRAMUSCULAR | Status: DC | PRN
  Administered 2019-03-06: 19:00:00 100 ug via INTRAVENOUS
  Administered 2019-03-07: 08:00:00 50 ug via INTRAVENOUS
  Administered 2019-03-07 (×4): 100 ug via INTRAVENOUS
  Filled 2019-03-06 (×7): qty 2

## 2019-03-06 MED ORDER — SODIUM CHLORIDE 0.9% IV SOLUTION
Freq: Once | INTRAVENOUS | Status: AC
  Administered 2019-03-06: 12:00:00 via INTRAVENOUS

## 2019-03-06 MED ORDER — IPRATROPIUM-ALBUTEROL 0.5-2.5 (3) MG/3ML IN SOLN
3.0000 mL | Freq: Four times a day (QID) | RESPIRATORY_TRACT | Status: DC
  Administered 2019-03-06 – 2019-03-08 (×7): 3 mL via RESPIRATORY_TRACT
  Filled 2019-03-06 (×7): qty 3

## 2019-03-06 MED ORDER — SODIUM CHLORIDE 0.9 % IV SOLN
2.0000 g | INTRAVENOUS | Status: DC
  Administered 2019-03-07 – 2019-03-11 (×5): 2 g via INTRAVENOUS
  Filled 2019-03-06 (×5): qty 2

## 2019-03-06 MED ORDER — PANTOPRAZOLE SODIUM 40 MG IV SOLR
40.0000 mg | Freq: Two times a day (BID) | INTRAVENOUS | Status: DC
  Administered 2019-03-06 – 2019-03-09 (×7): 40 mg via INTRAVENOUS
  Filled 2019-03-06 (×7): qty 40

## 2019-03-06 NOTE — Progress Notes (Signed)
NAME:  Nichole Franklin, MRN:  [id number], DOB:  27-Feb-1951, LOS: 8 ADMISSION DATE:  02/20/2019, CONSULTATION DATE:  7/4 REFERRING MD:  Aileen Fass, CHIEF COMPLAINT:  Found confused, tongue swelling  Brief History   This is a 68 year old female with an extensive past medical history who was admitted on February 26, 2019 with acute kidney injury weakness and diarrhea in the setting of a new COVID-19 diagnosis.  She was admitted to the [facility] due to her renal failure which improved with IV fluids.  She was transferred to [facility].  On hospital day 6 she developed worsening confusion and tongue swelling after -- severe and was transferred to the ICU emergently for intubation.  History of present illness   No further history could be obtained, refer to brief history above  Past Medical History  Hypertension Hyperlipidemia Diabetes mellitus Gastroesophageal reflux disease History of cervical cancer Anxiety  Significant Hospital Events   June 28 admission [facility] from [facility] for AKI, poor po intake June 30 transfer to [facility], confusion July 2: worsening hypoxemia July 3 remdesivir, worsening confusion July 4 tongue swelling, hypertension, severe encephalopathy, transfer to ICU, intubation, new worsening fever - Admitted to the ICU for worsening confusion, required emergent intubation Tongue swelling noted   [id number] -intubated yesterday secondary to angioedema thought secondary to antiviral Remdesivir.  Currently on Precedex gtt and fent gt  No overnight issues.  On 35% oxygen and PEEP of 5 on the ventilator with a pulse ox of 100%.  Not on pressors.  Making urine.  Hemoglobin dropped to 6.3 but recheck was 7.1 g%.  No obvious bleeding.  Is on IV heparin drip for empiric PE. Cannot get CTA due to low gfr Consults:  Nephrology, signed off PCCM  Procedures:  July 4 left IJ CVL July 4 endotracheal tube  Significant Diagnostic Tests:  June 28 CT scan abdomen pelvis for  nephrolithiasis: No explanation of renal failure, no urinary calculus, extensive multifocal groundglass opacity in lungs, sclerotic inferior endplate deformities of T11 and T12 July 4 CT Head > pending  Micro Data:  7/4 resp culture  Antimicrobials:  June 28 ceftriaxone and azithromycin through July 3 July 4 cefepime, linezolid July 3 remdesivir  Interim history/subjective:   7/6 - improved angioedema, following some commands but no cuff leak in morning rounds and wheezy on exam as well/ BUN worsening.   Objective   Blood pressure (!) 175/71, pulse 86, temperature 99.5 F (37.5 C), temperature source Axillary, resp. rate 13, height 5\' 6"  (1.676 m), weight 78.8 kg, SpO2 96 %. CVP:  [5 mmHg-17 mmHg] 9 mmHg  Vent Mode: CPAP;PSV FiO2 (%):  [30 %-35 %] 30 % Set Rate:  [24 bmp] 24 bmp Vt Set:  [470 mL] 470 mL PEEP:  [5 cmH20] 5 cmH20 Pressure Support:  [10 cmH20] 10 cmH20 Plateau Pressure:  [19 cmH20-27 cmH20] 22 cmH20   Intake/Output Summary (Last 24 hours) at 03/06/2019 1447 Last data filed at 03/06/2019 1200 Gross per 24 hour  Intake 1718.94 ml  Output 1775 ml  Net -56.06 ml   Filed Weights   03/03/19 0500 03/04/19 1030 03/06/19 0500  Weight: 126.4 kg 74.6 kg 78.8 kg   General Appearance:  Looks criticall ill OBESE - + Head:  Normocephalic, without obvious abnormality, atraumatic Eyes:  PERRL - yes, conjunctiva/corneas - clea     Ears:  Normal external ear canals, both ears Nose:  G tube - no Throat:  ETT TUBE - yes , OG tube -  yes Neck:  Supple,  No enlargement/tenderness/nodules Lungs: Clear to auscultation bilaterally, Ventilator   Synchrony - yes Heart:  S1 and S2 normal, no murmur, CVP - n.  Pressors - no Abdomen:  Soft, no masses, no organomegaly Genitalia / Rectal:  Not done Extremities:  Extremities- intact Skin:  ntact in exposed areas . Sacral area - not examind Neurologic:  Sedation - precedex gtt -> RASS - -3 . Moves all 4s - yes. CAM-ICU - not able to test .  Orientation - na        LABS    PULMONARY Recent Labs  Lab 02/28/19 0747 03/04/19 1100 03/05/19 0836  PHART 7.414 7.430 7.361  PCO2ART 28.7* 36.0 40.7  PO2ART 56.1* 270.0* 87.0  HCO3 18.2* 23.8 23.0  TCO2  --  25 24  O2SAT 88.7 100.0 96.0    CBC Recent Labs  Lab 03/04/19 0131  03/05/19 0408 03/05/19 0700 03/05/19 0836 03/06/19 0430  HGB 8.1*   < > 6.3* 7.1* 7.1* 6.7*  HCT 26.2*   < > 21.8* 23.4* 21.0* 23.3*  WBC 23.2*  --  21.1*  --   --  33.0*  PLT 256  --  198  --   --  192   < > = values in this interval not displayed.    COAGULATION No results for input(s): INR in the last 168 hours.  CARDIAC  No results for input(s): TROPONINI in the last 168 hours. No results for input(s): PROBNP in the last 168 hours.   CHEMISTRY Recent Labs  Lab 03/02/19 1505 03/03/19 0330 03/03/19 0954 03/04/19 0131 03/04/19 1100 03/04/19 1558 03/05/19 0408 03/05/19 0836 03/05/19 1700 03/06/19 0430  NA 146*  --  144 149* 150*  --  151* 151*  --  146*  K 3.1*  --  3.4* 3.5 3.5  --  4.7 4.9  --  4.3  CL 102  --  103 107  --   --  117*  --   --  113*  CO2 29  --  25 25  --   --  23  --   --  24  GLUCOSE 289*  --  291* 230*  --   --  220*  --   --  260*  BUN 81*  --  76* 79*  --   --  92*  --   --  100*  CREATININE 3.91*  --  3.46* 3.23*  --   --  3.26*  --   --  3.21*  CALCIUM 6.9*  --  7.1* 7.6*  --   --  7.3*  --   --  7.7*  MG  --  2.0  --  2.1  --  1.9 1.9  --  2.6*  --   PHOS  --   --   --   --   --  4.3 3.5  --  3.7  --    Estimated Creatinine Clearance: 17.8 mL/min (A) (by C-G formula based on SCr of 3.21 mg/dL (H)).   LIVER Recent Labs  Lab 03/02/19 0300 03/04/19 0131 03/05/19 0408 03/05/19 0700 03/06/19 0430  AST 43* 104* 65* 51* 44*  ALT 19 51* 53* 52* 44  ALKPHOS 133* 197* 161* 174* 154*  BILITOT 0.4 0.9 0.4 0.6 0.3  PROT 6.9 7.0 5.4* 6.1* 5.9*  ALBUMIN 2.7* 2.9* 2.4* 2.6* 2.5*     INFECTIOUS Recent Labs  Lab 03/01/19 0413 03/05/19 0408    PROCALCITON 3.36 0.94  ENDOCRINE CBG (last 3)  Recent Labs    03/06/19 0352 03/06/19 0719 03/06/19 1138  GLUCAP 232* 213* 283*         IMAGING x48h  - image(s) personally visualized  -   highlighted in bold Ct Head Wo Contrast  Result Date: 03/04/2019 CLINICAL DATA:  68 year old female with COVID-19.  Confusion. EXAM: CT HEAD WITHOUT CONTRAST TECHNIQUE: Contiguous axial images were obtained from the base of the skull through the vertex without intravenous contrast. COMPARISON:  Cervical spine radiographs 08/07/2010. FINDINGS: Brain: No midline shift, mass effect, or evidence of intracranial mass lesion. No ventriculomegaly. No acute intracranial hemorrhage identified. Patchy bilateral cerebral white matter hypodensity most pronounced at the frontal horns. Deep gray matter nuclei, brainstem and cerebellum appear negative. No cortical encephalomalacia. No cortically based acute infarct identified. Vascular: Calcified atherosclerosis at the skull base. No suspicious intracranial vascular hyperdensity. Skull: No acute osseous abnormality identified. Sinuses/Orbits: Trace sinus mucosal thickening, most pronounced in the right sphenoid. The tympanic cavities are clear. There are mild bilateral mastoid effusions. Other: The patient is intubated on the scout view. There is fluid in the visible pharynx. No acute orbit or scalp soft tissue findings. IMPRESSION: 1.  No acute intracranial abnormality. 2. Mild to moderate for age nonspecific white matter changes, most commonly due to chronic small vessel disease. Electronically Signed   By: Genevie Ann M.D.   On: 03/04/2019 18:15   Vas [place] Lower Extremity Venous (dvt)  Result Date: 03/06/2019  Lower Venous Study Indications: Swelling, Edema, and Fever and Positive Covid 19.  Comparison Study: No prior. Performing Technologist: Oda Cogan RDMS, RVT  Examination Guidelines: A complete evaluation includes B-mode imaging, spectral Doppler, color Doppler,  and power Doppler as needed of all accessible portions of each vessel. Bilateral testing is considered an integral part of a complete examination. Limited examinations for reoccurring indications may be performed as noted.  +---------+---------------+---------+-----------+----------+-------+  RIGHT     Compressibility Phasicity Spontaneity Properties Summary  +---------+---------------+---------+-----------+----------+-------+  CFV       Full            Yes       Yes                             +---------+---------------+---------+-----------+----------+-------+  SFJ       Full                                                      +---------+---------------+---------+-----------+----------+-------+  FV Prox   Full                                                      +---------+---------------+---------+-----------+----------+-------+  FV Mid    Full                                                      +---------+---------------+---------+-----------+----------+-------+  FV Distal Full                                                      +---------+---------------+---------+-----------+----------+-------+  PFV       Full                                                      +---------+---------------+---------+-----------+----------+-------+  POP       Full            Yes       Yes                             +---------+---------------+---------+-----------+----------+-------+  PTV       Full                                                      +---------+---------------+---------+-----------+----------+-------+  PERO      Full                                                      +---------+---------------+---------+-----------+----------+-------+   +---------+---------------+---------+-----------+----------+-------+  LEFT      Compressibility Phasicity Spontaneity Properties Summary  +---------+---------------+---------+-----------+----------+-------+  CFV       Full            Yes       Yes                              +---------+---------------+---------+-----------+----------+-------+  SFJ       Full                                                      +---------+---------------+---------+-----------+----------+-------+  FV Prox   Full                                                      +---------+---------------+---------+-----------+----------+-------+  FV Mid    Full                                                      +---------+---------------+---------+-----------+----------+-------+  FV Distal Full                                                      +---------+---------------+---------+-----------+----------+-------+  PFV       Full                                                      +---------+---------------+---------+-----------+----------+-------+  POP       Full            Yes       Yes                             +---------+---------------+---------+-----------+----------+-------+  PTV       Full                                                      +---------+---------------+---------+-----------+----------+-------+  PERO      Full                                                      +---------+---------------+---------+-----------+----------+-------+     Summary: Right: There is no evidence of deep vein thrombosis in the lower extremity. Left: There is no evidence of deep vein thrombosis in the lower extremity.  *See table(s) above for measurements and observations.    Preliminary      Resolved Hospital Problem list      Assessment & Plan:  Acute encephalopathy: Unclear etiology, could be related to steroids, hospital delirium in setting of multiple metabolic derangements, less likely press syndrome or related to direct neurotoxicity from COVID. Ct head 7/4 unremarkable   03/06/2019 - maintains only on precedex. Imprioving mental status  PLAN Control blood pressure precedex   Worsening acute respiratory failure with hypoxemia in setting of COVID-19 pneumoni but mainly angioedema issue from  remdesivir Suspected Pulmonary embolism 03/02/2019 - baed on D-dimer 7.65  03/06/2019 - no cuff leak  PLAN Continue Solu-Medrol Mechanical ventilation via ARDS protocol  `- Target tidal volume 6 to 8 cc/kg ideal body weight  - Target plateau pressure less than 30 cm of water  - Target driving pressure less than 15 cm of water Ventilator associated pneumonia prevention protocol Continue SQ dose proph dose  Since 03/05/2019 (changed from IV gtt that was started for presumed PE but now stopped due to hgb drop) Await LE doppler Monitor carefully for bleeding NO REMDESVIRI (allergy)  Anemia  Recent Labs  Lab 03/04/19 1100 03/05/19 0408 03/05/19 0700 03/05/19 0836 03/06/19 0430  HGB 7.8* 6.3* 7.1* 7.1* 6.7*     Plan - PRBC x 1  - No IV hep gtt  - - PRBC for hgb </= 6.9gm%    - exceptions are   -  if ACS susepcted/confirmed then transfuse for hgb </= 8.0gm%,  or    -  active bleeding with hemodynamic instability, then transfuse regardless of hemoglobin value   At at all times try to transfuse 1 unit prbc as possible with exception of active hemorrhage   Need for sedation for mechanical ventilation: Precedex infusion, with prn  fentanyl and Versed as needed for synchrony Hold quetiapine considering QT prolongation  QT prolongation 534 msec on 03/04/2019: Hold quetiapine Hold azithromycin Stop haldol on 03/06/2019 Recheck EKG 03/07/19   Worsening fever 03/04/2019 , hypoxemia: Most likely due to COVID-19, however consider healthcare associated pneumonia. MRSA PCR neg 03/04/2019 . PCT 3 and improved c/w bacterial pna  Plan  - dc [place]  - continue cefpeime   Angioedema: No anaphylaxis, concern  for reaction to remdesivir though timing not classic for allergic reaction  7/6 - improved tongue edema. But no cuff leak  plan Hold further doses of remdesivir Continue Solu-Medrol Supportive care Dc benadryl  Pre-renal azotemia   7/6 - rising BUN. ? Due to solumedrol. No  bleeding  - Rising BUN and rising Na - slowly improved creat  Plan  - cotninue free water - doubel PPI dosing - might need renal consult   Best practice:  Diet: continue  tube feeding Pain/Anxiety/Delirium protocol (if indicated): yes, RASS target -1 to -2 VAP protocol (if indicated): yes DVT prophylaxis: lovenox GI prophylaxis: famotidine Glucose control: SSI Mobility: bed rest Code Status: full  Family Communication: updated her daughter [name] by phone on 7/4 . On 03/05/2019  -daughter [name] on phone. Called her  03/06/2019 and LMTCB 3:22 PM\  Disposition: remain in [place]   The patient Nichole Franklin is critically ill with multiple organ systems failure and requires high complexity decision making for assessment and support, frequent evaluation and titration of therapies, application of advanced monitoring technologies and extensive interpretation of multiple databases.   Critical Care Time devoted to patient care services described in this note is  30  Minutes. This time reflects time of care of this signee Dr Brand Males. This critical care time does not reflect procedure time, or teaching time or supervisory time of PA/NP/Med student/Med Resident etc but could involve care discussion time     Dr. Brand Males, M.D., [facility].C.P Pulmonary and Critical Care Medicine Staff Physician Guide [facility] Pager: [phone], If no answer or between  15:00h - 7:00h: call [phone]  03/06/2019 2:47 PM

## 2019-03-06 NOTE — Progress Notes (Signed)
Spoke with Nichole Franklin regarding a note to call Nichole Franklin to give updates. Wanted to make sure it was ok to discuss medical information with Nichole Franklin as she wasn't listed in her contacts. Per Nichole Franklin, it is ok to discuss information with Nichole Franklin as she is Nichole Franklin's Goddaughter and an ICU nurse from [place].   Called Nichole Franklin and allowed [employer] and answered all her questions. She asked that I pass on to ask to treat patient with decadron vs solumedrol. She also questioned if dialysis was an option with her kidney disease and rising BUN and creatinine. Advised that I would pass along all her questions.

## 2019-03-06 NOTE — Progress Notes (Signed)
Venous duplex lower ext  has been completed. Refer to [facility] under chart review to view preliminary results.   03/06/2019  11:32 AM Ulice Follett, Bonnye Fava

## 2019-03-06 NOTE — Progress Notes (Signed)
PHARMACY NOTE:  ANTIMICROBIAL RENAL DOSAGE ADJUSTMENT  Current antimicrobial regimen includes a mismatch between antimicrobial dosage and estimated renal function.  As per policy approved by the Pharmacy & Therapeutics and Medical Executive Committees, the antimicrobial dosage will be adjusted accordingly.  Current antimicrobial dosage:  Cefepime 1 gm IV Q 12 hours   Indication: HCAP  Renal Function:  Estimated Creatinine Clearance: 17.8 mL/min (A) (by C-G formula based on SCr of 3.21 mg/dL (H)). []      On intermittent HD, scheduled: []      On CRRT    Antimicrobial dosage has been changed to:  2 gm IV Q 24 hours   Additional comments:   Thank you for allowing pharmacy to be a part of this patient's care.  Albertina Parr, PharmD., BCPS Clinical Pharmacist

## 2019-03-06 NOTE — Progress Notes (Signed)
Spoke with patients daughter, [name]. Answered all her questions as well as had FaceTime so she could say goodnight to her mom.

## 2019-03-06 NOTE — Progress Notes (Signed)
Telephone and FaceTime update given to daughter, Katherina Right. All questions answered.

## 2019-03-06 NOTE — Progress Notes (Addendum)
CRITICAL VALUE ALERT  Critical Value:  Hgb 6.7  Date & Time Notied:  03/06/2019 0620  Provider Notified: Dr. Loleta Books  Orders Received/Actions taken: awaiting new orders   Per Dr. Loleta Books, will pass along to day team to handle regarding treatment plan. He stated he will let them know. It will also be passed along in nursing report.

## 2019-03-06 NOTE — Progress Notes (Signed)
Inpatient Diabetes Program Recommendations  AACE/ADA: New Consensus Statement on Inpatient Glycemic Control (2015)  Target Ranges:  Prepandial:   less than 140 mg/dL      Peak postprandial:   less than 180 mg/dL (1-2 hours)      Critically ill patients:  140 - 180 mg/dL   Lab Results  Component Value Date   GLUCAP 283 (H) 03/06/2019   HGBA1C 9.3 (H) 02/27/2019    Review of Glycemic Control Results for Nichole Franklin, Nichole Franklin (MRN [id number]) as of 03/06/2019 13:53  Ref. Range 03/05/2019 20:26 03/05/2019 23:48 03/06/2019 03:52 03/06/2019 07:19 03/06/2019 11:38  Glucose-Capillary Latest Ref Range: 70 - 99 mg/dL 172 (H) 208 (H) 232 (H) 213 (H) 283 (H)    Inpatient Diabetes Program Recommendations:   Please consider Novolog 3 units q 4 hrs tube feed coverage with hold if tube feed held or stopped for any reason.  Thank you, Nani Gasser. Kadarius Cuffe, RN, MSN, CDE  Diabetes Coordinator Inpatient Glycemic Control Team Team Pager [phone] (8am-5pm) 03/06/2019 1:55 PM

## 2019-03-06 NOTE — Progress Notes (Signed)
PROGRESS NOTE                                                                                                                                                                                                             Patient Demographics:    Nichole Franklin, is a 68 y.o. female, DOB - July 23, 1951, XBD:[id number]  Outpatient Primary MD for the patient is Nichole Franklin, Nichole Newcomer, MD    LOS - 8  No chief complaint on file.      Brief Narrative: Patient is a 68 y.o. female with PMHx of DM-2, HTN, dyslipidemia, CKD stage IV who presented to the hospital initially for evaluation of weakness, she was found to have fever and acute kidney injury.  She was subsequently admitted to [facility] her renal function stabilized-she was transferred to [facility] course since admission has been complicated by encephalopathy, angioedema following initiation of Remdesivir-and respiratory failure requiring intubation on 7/4.   Subjective:    Nichole Franklin today mains intubated and sedated.  No major issues overnight.   Assessment  & Plan :   Acute hypoxemic respiratory failure secondary to COVID-19 and presumed superimposed bacterial infection: Continue full ventilator support-PCCM following and directing care.  Remains on empiric cefepime for superimposed bacterial pna. Was given remdesivir but was stopped due to development angioedema. Not given actemra as concern for superimposed bacterial PNA.  However remains on IV Steroids. Continue supportive care-we will follow closely for clinical improvement.  COVID-19 Labs:  Recent Labs    03/04/19 0131 03/05/19 0408 03/06/19 0430  FERRITIN 181 144 145  LDH 785* 603* 537*  CRP 1.1* 1.1* 1.1*    Lab Results  Component Value Date   SARSCOV2NAA POSITIVE (A) 02/14/2019     COVID-19 Medications:  Vent Settings: Vent Mode: CPAP;PSV FiO2 (%):  [30 %-35 %] 30 % Set  Rate:  [24 bmp] 24 bmp Vt Set:  [470 mL] 470 mL PEEP:  [5 cmH20] 5 cmH20 Pressure Support:  [10 cmH20] 10 cmH20 Plateau Pressure:  [19 cmH20-27 cmH20] 22 cmH20  ABG:    Component Value Date/Time   PHART 7.361 03/05/2019 0836   PCO2ART 40.7 03/05/2019 0836   PO2ART 87.0 03/05/2019 0836   HCO3 23.0 03/05/2019 0836   TCO2 24 03/05/2019 0836   ACIDBASEDEF 2.0 03/05/2019 0836   O2SAT  96.0 03/05/2019 5631    Acute metabolic encephalopathy: Suspected etiology felt to be worsening hypoxemia, underlying COVID/bacterial pneumonia and AKI.  CT head on 7/4 without any acute abnormalities.  Remains sedated with Precedex while on the ventilator.  Once sedation can be tapered down further-we will need to assess for improvement.  Angioedema: This occurred on 7/4-patient was started on Remdesivir on 7/3-tongue swelling has improved. No longer on remdesivir-which is the suspected etiology,.  AKI on CKD stage IV: AKI thought to be hemodynamically mediated in the setting of COVID-19 infection-ACE/HCTZ use.  Creatinine slowly downtrending. BUN is significantly elevated today-follow for now. May need to consult Nephrology at some point.   Hypernatremia: Probably secondary to dehydration-improving-continue free water through tube and gentile hydration with half normal saline.   DM-2 with uncontrolled hyperglycemia: No longer on Insulin infusion-CBG's stable-continue Lantus 25 units and SSI  HTN: Blood pressure better controlled today-continue amlodipine and labetalol  Anemia: No evidence of blood loss-no melena or hematochezia evident.  Suspect worsening anemia secondary to critical illness and IV fluid dilution in the background of CKD.  However BUN is significantly elevated-hence will watch closely for developing GI bleed. Will start on  PPI-if GI blled becomes apparent will need to consult GI. Will be transfused one unit of PRBC today   Elevated d-dimer: She was empirically started on IV heparin on 7/4  due to concern for VTE-however given drop in hemoglobin-we will go ahead and change to prophylactic dose of heparin.  Unable to do a CT angio of the chest given advanced CKD/AKI-however lower extremity doppler of lower extremities negative for DVT (PRELIM RESULT)  Condition - Extremely Guarded/  Family Communication  :  Per PCCM  Code Status :  Full Code  Diet :  Diet Order            Diet NPO time specified Except for: Sips with Meds  Diet effective now               Disposition Plan  :  Remain inpatient-remain in ICU  Consults  :  PCCM  Procedures  :    ETT>>7/4 Left IJ>>7/4  GI prophylaxis: ppi  DVT Prophylaxis  : Prophylactic Heparin     Lab Results  Component Value Date   PLT 192 03/06/2019    Inpatient Medications  Scheduled Meds:  sodium chloride   Intravenous Once   amLODipine  10 mg Per Tube Daily   chlorhexidine gluconate (MEDLINE KIT)  15 mL Mouth Rinse BID   Chlorhexidine Gluconate Cloth  6 each Topical Q0600   cholecalciferol  1,000 Units Per Tube Daily   diphenhydrAMINE  50 mg Intravenous Q12H   feeding supplement (PRO-STAT SUGAR FREE 64)  30 mL Per Tube BID   ferrous sulfate  300 mg Per Tube BID WC   free water  300 mL Per Tube Q4H   heparin injection (subcutaneous)  7,500 Units Subcutaneous Q8H   insulin aspart  0-20 Units Subcutaneous Q4H   insulin glargine  25 Units Subcutaneous BID   labetalol  10 mg Intravenous Q8H   mouth rinse  15 mL Mouth Rinse 10 times per day   methylPREDNISolone (SOLU-MEDROL) injection  30 mg Intravenous Q12H   pantoprazole (PROTONIX) IV  40 mg Intravenous Q12H   vitamin C  1,000 mg Per Tube Daily   zinc sulfate  220 mg Per Tube Daily   Continuous Infusions:  sodium chloride 60 mL/hr at 03/06/19 0647   [START ON 03/07/2019] ceFEPime (MAXIPIME)  IV     dexmedetomidine (PRECEDEX) IV infusion 1 mcg/kg/hr (03/06/19 1049)   famotidine (PEPCID) IV 20 mg (03/06/19 0838)   feeding supplement (VITAL  AF 1.2 CAL) 1,000 mL (03/06/19 0839)   fentaNYL infusion INTRAVENOUS 100 mcg/hr (03/06/19 0000)   PRN Meds:.acetaminophen **OR** acetaminophen, dextrose, EPINEPHrine, fentaNYL, haloperidol lactate, hydrALAZINE, midazolam, midazolam, ondansetron **OR** ondansetron (ZOFRAN) IV  Antibiotics  :    Anti-infectives (From admission, onward)   Start     Dose/Rate Route Frequency Ordered Stop   03/07/19 1000  ceFEPIme (MAXIPIME) 2 g in sodium chloride 0.9 % 100 mL IVPB     2 g 200 mL/hr over 30 Minutes Intravenous Every 24 hours 03/06/19 1434     03/04/19 2200  ceFEPIme (MAXIPIME) 1 g in sodium chloride 0.9 % 100 mL IVPB  Status:  Discontinued     1 g 200 mL/hr over 30 Minutes Intravenous Every 12 hours 03/04/19 1256 03/06/19 1434   03/04/19 1400  remdesivir 100 mg in sodium chloride 0.9 % 250 mL IVPB  Status:  Discontinued     100 mg 500 mL/hr over 30 Minutes Intravenous Every 24 hours 03/03/19 1305 03/04/19 0936   03/04/19 1200  linezolid (ZYVOX) IVPB 600 mg  Status:  Discontinued     600 mg 300 mL/hr over 60 Minutes Intravenous Every 12 hours 03/04/19 1141 03/05/19 1100   03/04/19 0900  linezolid (ZYVOX) IVPB 600 mg  Status:  Discontinued     600 mg 300 mL/hr over 60 Minutes Intravenous Every 12 hours 03/04/19 0759 03/04/19 1141   03/04/19 0800  ceFEPIme (MAXIPIME) 2 g in sodium chloride 0.9 % 100 mL IVPB  Status:  Discontinued     2 g 200 mL/hr over 30 Minutes Intravenous Every 12 hours 03/04/19 0706 03/04/19 1256   03/03/19 1400  remdesivir 200 mg in sodium chloride 0.9 % 250 mL IVPB     200 mg 500 mL/hr over 30 Minutes Intravenous Once 03/03/19 1305 03/03/19 1500   02/27/19 2000  azithromycin (ZITHROMAX) 500 mg in sodium chloride 0.9 % 250 mL IVPB  Status:  Discontinued     500 mg 250 mL/hr over 60 Minutes Intravenous Every 24 hours 02/27/19 0428 03/04/19 1211   02/27/19 0430  cefTRIAXone (ROCEPHIN) 1 g in sodium chloride 0.9 % 100 mL IVPB  Status:  Discontinued     1 g 200 mL/hr  over 30 Minutes Intravenous Every 24 hours 02/27/19 0428 03/04/19 0706       Time Spent in minutes  45   Oren Binet M.D on 03/06/2019 at 2:41 PM  To page go to [email or web address] - use universal password  [facility] -  Office  [phone]  The patient is critically ill with multiple organ system failure and requires high complexity decision making for assessment and support, frequent evaluation and titration of therapies, advanced monitoring, review of radiographic studies and interpretation of complex data.  Admit date - 02/06/2019    8    Objective:   Vitals:   03/06/19 1200 03/06/19 1204 03/06/19 1220 03/06/19 1235  BP: (!) 152/69 (!) 152/69 (!) 152/69 (!) 175/71  Pulse:  83 89 86  Resp:  '11 13 13  '$ Temp:   99.2 F (37.3 C) 99.5 F (37.5 C)  TempSrc:   Axillary Axillary  SpO2:  97%  96%  Weight:      Height:        Wt Readings from Last 3 Encounters:  03/06/19 78.8 kg  02/20/2019 72.6 kg  05/08/17 68 kg     Intake/Output Summary (Last 24 hours) at 03/06/2019 1441 Last data filed at 03/06/2019 1200 Gross per 24 hour  Intake 1718.94 ml  Output 1775 ml  Net -56.06 ml     Physical Exam General appearance:Intubated and sedated Eyes:no scleral icterus. HEENT: Atraumatic and Normocephalic Neck: supple, no JVD. Resp:Good air entry bilaterally,+rhochi CVS: S1 S2 regular, no murmurs.  GI: Bowel sounds present, Non tender and not distended with no gaurding, rigidity or rebound. Extremities: B/L Lower Ext shows no edema, both legs are warm to touch    Data Review:    CBC Recent Labs  Lab 02/28/19 0512 03/01/19 0413 03/02/19 0300 03/03/19 0954 03/04/19 0131 03/04/19 1100 03/05/19 0408 03/05/19 0700 03/05/19 0836 03/06/19 0430  WBC 24.3* 19.7* 18.7* 20.7* 23.2*  --  21.1*  --   --  33.0*  HGB 8.7* 8.1* 8.1* 8.0* 8.1* 7.8* 6.3* 7.1* 7.1* 6.7*  HCT 25.9* 24.7* 25.3* 25.9* 26.2* 23.0* 21.8* 23.4* 21.0* 23.3*  PLT 329 273 256 246 256  --  198  --   --   192  MCV 80.9 82.6 84.9 86.6 87.0  --  91.6  --   --  92.1  MCH 27.2 27.1 27.2 26.8 26.9  --  26.5  --   --  26.5  MCHC 33.6 32.8 32.0 30.9 30.9  --  28.9*  --   --  28.8*  RDW 16.1* 16.1* 15.9* 15.9* 15.7*  --  16.4*  --   --  16.3*  LYMPHSABS 1.8 1.4 1.8  --  1.7  --   --   --   --   --   MONOABS 1.1* 1.1* 1.8*  --  1.9*  --   --   --   --   --   EOSABS 0.0 0.0 0.0  --  0.0  --   --   --   --   --   BASOSABS 0.0 0.0 0.0  --  0.1  --   --   --   --   --     Chemistries  Recent Labs  Lab 03/02/19 0300 03/02/19 1505 03/03/19 0330 03/03/19 0954 03/04/19 0131 03/04/19 1100 03/04/19 1558 03/05/19 0408 03/05/19 0700 03/05/19 0836 03/05/19 1700 03/06/19 0430  NA 146* 146*  --  144 149* 150*  --  151*  --  151*  --  146*  K 3.0* 3.1*  --  3.4* 3.5 3.5  --  4.7  --  4.9  --  4.3  CL 100 102  --  103 107  --   --  117*  --   --   --  113*  CO2 27 29  --  25 25  --   --  23  --   --   --  24  GLUCOSE 269* 289*  --  291* 230*  --   --  220*  --   --   --  260*  BUN 81* 81*  --  76* 79*  --   --  92*  --   --   --  100*  CREATININE 4.14* 3.91*  --  3.46* 3.23*  --   --  3.26*  --   --   --  3.21*  CALCIUM 6.8* 6.9*  --  7.1* 7.6*  --   --  7.3*  --   --   --  7.7*  MG 2.0  --  2.0  --  2.1  --  1.9 1.9  --   --  2.6*  --   AST 43*  --   --   --  104*  --   --  65* 51*  --   --  44*  ALT 19  --   --   --  51*  --   --  53* 52*  --   --  44  ALKPHOS 133*  --   --   --  197*  --   --  161* 174*  --   --  154*  BILITOT 0.4  --   --   --  0.9  --   --  0.4 0.6  --   --  0.3   ------------------------------------------------------------------------------------------------------------------ No results for input(s): CHOL, HDL, LDLCALC, TRIG, CHOLHDL, LDLDIRECT in the last 72 hours.  Lab Results  Component Value Date   HGBA1C 9.3 (H) 02/27/2019   ------------------------------------------------------------------------------------------------------------------ No results for input(s): TSH,  T4TOTAL, T3FREE, THYROIDAB in the last 72 hours.  Invalid input(s): FREET3 ------------------------------------------------------------------------------------------------------------------ Recent Labs    03/05/19 0408 03/06/19 0430  FERRITIN 144 145    Coagulation profile No results for input(s): INR, PROTIME in the last 168 hours.  No results for input(s): DDIMER in the last 72 hours.  Cardiac Enzymes No results for input(s): CKMB, TROPONINI, MYOGLOBIN in the last 168 hours.  Invalid input(s): CK ------------------------------------------------------------------------------------------------------------------    Component Value Date/Time   BNP 151.7 (H) 03/05/2019 0409    Micro Results Recent Results (from the past 240 hour(s))  SARS Coronavirus 2 (CEPHEID- Performed in [facility] hospital lab), Hosp Order     Status: Abnormal   Collection Time: 02/27/2019 12:47 PM   Specimen: Nasopharyngeal Swab  Result Value Ref Range Status   SARS Coronavirus 2 POSITIVE (A) NEGATIVE Final    Comment: RESULT CALLED TO, READ BACK BY AND VERIFIED WITH: KIM MAIN '@1410'$  ON 02/28/2019 BY FMW (NOTE) If result is NEGATIVE SARS-CoV-2 target nucleic acids are NOT DETECTED. The SARS-CoV-2 RNA is generally detectable in upper and lower  respiratory specimens during the acute phase of infection. The lowest  concentration of SARS-CoV-2 viral copies this assay can detect is 250  copies / mL. A negative result does not preclude SARS-CoV-2 infection  and should not be used as the sole basis for treatment or other  patient management decisions.  A negative result may occur with  improper specimen collection / handling, submission of specimen other  than nasopharyngeal swab, presence of viral mutation(s) within the  areas targeted by this assay, and inadequate number of viral copies  (<250 copies / mL). A negative result must be combined with clinical  observations, patient history, and  epidemiological information. If result is POSITIVE SARS-CoV-2 target nucleic acids are DETECTED. T he SARS-CoV-2 RNA is generally detectable in upper and lower  respiratory specimens during the acute phase of infection.  Positive  results are indicative of active infection with SARS-CoV-2.  Clinical  correlation with patient history and other diagnostic information is  necessary to determine patient infection status.  Positive results do  not rule out bacterial infection or co-infection with other viruses. If result is PRESUMPTIVE POSTIVE SARS-CoV-2 nucleic acids MAY BE PRESENT.   A presumptive positive result was obtained on the submitted specimen  and confirmed on repeat testing.  While 2019 novel coronavirus  (SARS-CoV-2) nucleic acids may be present in the submitted sample  additional confirmatory testing may be necessary for epidemiological  and / or  clinical management purposes  to differentiate between  SARS-CoV-2 and other Sarbecovirus currently known to infect humans.  If clinically indicated additional testing with an alternate test  methodology [phone]) is  advised. The SARS-CoV-2 RNA is generally  detectable in upper and lower respiratory specimens during the acute  phase of infection. The expected result is Negative. Fact Sheet for Patients:  StrictlyIdeas.no Fact Sheet for Healthcare Providers: BankingDealers.co.za This test is not yet approved or cleared by the [place] FDA and has been authorized for detection and/or diagnosis of SARS-CoV-2 by FDA under an Emergency Use Authorization (EUA).  This EUA will remain in effect (meaning this test can be used) for the duration of the COVID-19 declaration under Section 564(b)(1) of the Act, 21 U.S.C. section 360bbb-3(b)(1), unless the authorization is terminated or revoked sooner. Performed at [facility], [address] [place], [place] [address]   Culture,  blood (Routine X 2) w Reflex to ID Panel     Status: None   Collection Time: 02/03/2019  3:54 PM   Specimen: BLOOD  Result Value Ref Range Status   Specimen Description BLOOD BLOOD LEFT FOREARM  Final   Special Requests   Final    BOTTLES DRAWN AEROBIC AND ANAEROBIC Blood Culture results may not be optimal due to an excessive volume of blood received in culture bottles   Culture   Final    NO GROWTH 5 DAYS Performed at [facility], [address] [place], [place] [address]    Report Status 03/03/2019 FINAL  Final  Culture, blood (Routine X 2) w Reflex to ID Panel     Status: None   Collection Time: 02/09/2019  3:54 PM   Specimen: BLOOD  Result Value Ref Range Status   Specimen Description BLOOD LEFT ANTECUBITAL  Final   Special Requests   Final    BOTTLES DRAWN AEROBIC AND ANAEROBIC Blood Culture results may not be optimal due to an excessive volume of blood received in culture bottles   Culture   Final    NO GROWTH 5 DAYS Performed at [facility], [address], [place], [place] [address]    Report Status 03/03/2019 FINAL  Final  Culture, respiratory (non-expectorated)     Status: None (Preliminary result)   Collection Time: 03/04/19 11:41 AM   Specimen: Tracheal Aspirate; Respiratory  Result Value Ref Range Status   Specimen Description   Final    TRACHEAL ASPIRATE Performed at [facility] [place] [address], [place], [place] [address]    Special Requests   Final    NONE Performed at [facility], [place] [address] [place], [place] [address]    Gram Stain   Final    RARE WBC PRESENT, PREDOMINANTLY PMN MODERATE SQUAMOUS EPITHELIAL CELLS PRESENT ABUNDANT GRAM POSITIVE COCCI    Culture   Final    MODERATE Consistent with normal respiratory flora. Performed at [facility], [place] [address] [place], [place] [address]    Report Status PENDING  Incomplete  MRSA PCR Screening     Status: None   Collection Time:  03/04/19  7:08 PM   Specimen: Nasal Mucosa; Nasopharyngeal  Result Value Ref Range Status   MRSA by PCR NEGATIVE NEGATIVE Final    Comment:        The GeneXpert MRSA Assay (FDA approved for NASAL specimens only), is one component of a comprehensive MRSA colonization surveillance program. It is not intended to diagnose MRSA infection nor to guide  or monitor treatment for MRSA infections. Performed at [facility], [place] [address], [place], [place] [address]     Radiology Reports Dg Abd 1 View  Result Date: 03/04/2019 CLINICAL DATA:  Orogastric placement EXAM: ABDOMEN - 1 VIEW COMPARISON:  None. FINDINGS: Orogastric tube enters the stomach in has its tip in the fundus. Gas pattern is unremarkable. IMPRESSION: Orogastric tube tip in the fundus of the stomach. Electronically Signed   By: Nelson Chimes M.D.   On: 03/04/2019 11:40   Ct Head Wo Contrast  Result Date: 03/04/2019 CLINICAL DATA:  68 year old female with COVID-19.  Confusion. EXAM: CT HEAD WITHOUT CONTRAST TECHNIQUE: Contiguous axial images were obtained from the base of the skull through the vertex without intravenous contrast. COMPARISON:  Cervical spine radiographs 08/07/2010. FINDINGS: Brain: No midline shift, mass effect, or evidence of intracranial mass lesion. No ventriculomegaly. No acute intracranial hemorrhage identified. Patchy bilateral cerebral white matter hypodensity most pronounced at the frontal horns. Deep gray matter nuclei, brainstem and cerebellum appear negative. No cortical encephalomalacia. No cortically based acute infarct identified. Vascular: Calcified atherosclerosis at the skull base. No suspicious intracranial vascular hyperdensity. Skull: No acute osseous abnormality identified. Sinuses/Orbits: Trace sinus mucosal thickening, most pronounced in the right sphenoid. The tympanic cavities are clear. There are mild bilateral mastoid effusions. Other: The patient is intubated on the scout view.  There is fluid in the visible pharynx. No acute orbit or scalp soft tissue findings. IMPRESSION: 1.  No acute intracranial abnormality. 2. Mild to moderate for age nonspecific white matter changes, most commonly due to chronic small vessel disease. Electronically Signed   By: Genevie Ann M.D.   On: 03/04/2019 18:15   Dg Chest Port 1 View  Result Date: 03/04/2019 CLINICAL DATA:  COVID-19 positive. Endotracheal and enteric tube placement. EXAM: PORTABLE CHEST 1 VIEW COMPARISON:  Chest x-ray dated February 28, 2019. FINDINGS: Interval placement of an endotracheal tube with the tip 3.0 cm above the carina. New enteric tube entering the stomach with the tip in the fundus. New left internal jugular central venous catheter with tip in the proximal SVC. Stable cardiomediastinal silhouette. Normal pulmonary vascularity. Patchy bilateral airspace disease has improved. New left basilar atelectasis. No pleural effusion or pneumothorax. No acute osseous abnormality. IMPRESSION: 1. Appropriately positioned endotracheal and enteric tubes. 2. Improving COVID-19 pneumonia. Electronically Signed   By: Titus Dubin M.D.   On: 03/04/2019 11:42   Dg Chest Port 1 View  Result Date: 02/28/2019 CLINICAL DATA:  Hypoxia, COVID-19 EXAM: PORTABLE CHEST 1 VIEW COMPARISON:  Portable exam 0711 hours compared to 02/03/2019 FINDINGS: Stable heart size mediastinal contours. Patchy airspace infiltrates bilaterally, LEFT greater than RIGHT, consistent with multifocal pneumonia and diagnosis of COVID-19. Infiltrates appear mildly increased since previous exam. No pleural effusion or pneumothorax. Bones demineralized. IMPRESSION: BILATERAL airspace infiltrates consistent with pneumonia and diagnosis of COVID-19, mildly increased since previous exam. Electronically Signed   By: Lavonia Dana M.D.   On: 02/28/2019 07:38   Dg Chest Port 1 View  Result Date: 02/14/2019 CLINICAL DATA:  Shortness of breath and cough. EXAM: PORTABLE CHEST 1 VIEW  COMPARISON:  02/17/2011 FINDINGS: Patchy bilateral pulmonary opacity with interstitial coarsening. Normal heart size for technique. No effusion or pneumothorax. IMPRESSION: Patchy bilateral infiltrate concerning for pneumonia-especially COVID-19. Electronically Signed   By: Monte Fantasia M.D.   On: 02/25/2019 13:04   Ct Renal Stone Study  Result Date: 02/25/2019 CLINICAL DATA:  Shortness of breath, dry cough, COVID-19, renal failure EXAM: CT  ABDOMEN AND PELVIS WITHOUT CONTRAST TECHNIQUE: Multidetector CT imaging of the abdomen and pelvis was performed following the standard protocol without IV contrast. COMPARISON:  05/08/2017 FINDINGS: Lower chest: Extensive, multifocal, predominantly subpleural ground-glass pulmonary opacity of the bilateral lung bases. Hepatobiliary: No focal liver abnormality is seen. Status post cholecystectomy. No biliary dilatation. Pancreas: Unremarkable. No pancreatic ductal dilatation or surrounding inflammatory changes. Spleen: Normal in size without significant abnormality. Adrenals/Urinary Tract: Adrenal glands are unremarkable. Kidneys are normal, without renal calculi, solid lesion, or hydronephrosis. Bladder is unremarkable. Stomach/Bowel: Stomach is within normal limits. Appendix appears normal. No evidence of bowel wall thickening, distention, or inflammatory changes. Colonic diverticulosis. Vascular/Lymphatic: Aortic atherosclerosis. No enlarged abdominal or pelvic lymph nodes. Reproductive: Status post hysterectomy. Other: No abdominal wall hernia or abnormality. No abdominopelvic ascites. Musculoskeletal: There are partially sclerotic inferior endplate deformities of the T11 and T12 vertebral bodies, new compared to prior examination. IMPRESSION: 1. No acute noncontrast CT findings of the abdomen or pelvis to explain renal failure. No evidence of urinary tract calculus or hydronephrosis. 2. Extensive, multifocal, predominantly subpleural ground-glass pulmonary opacity of  the bilateral lung bases, findings in keeping with reported diagnosis of COVID-19. 3. There are partially sclerotic inferior endplate deformities of the T11 and T12 vertebral bodies, new compared to prior examination, although of uncertain acuity. Correlate for acute pain and point tenderness. 4. Other chronic, incidental, and postoperative findings as detailed above. Electronically Signed   By: Eddie Candle M.D.   On: 02/14/2019 14:58   Vas [place] Lower Extremity Venous (dvt)  Result Date: 03/06/2019  Lower Venous Study Indications: Swelling, Edema, and Fever and Positive Covid 19.  Comparison Study: No prior. Performing Technologist: Oda Cogan RDMS, RVT  Examination Guidelines: A complete evaluation includes B-mode imaging, spectral Doppler, color Doppler, and power Doppler as needed of all accessible portions of each vessel. Bilateral testing is considered an integral part of a complete examination. Limited examinations for reoccurring indications may be performed as noted.  +---------+---------------+---------+-----------+----------+-------+  RIGHT     Compressibility Phasicity Spontaneity Properties Summary  +---------+---------------+---------+-----------+----------+-------+  CFV       Full            Yes       Yes                             +---------+---------------+---------+-----------+----------+-------+  SFJ       Full                                                      +---------+---------------+---------+-----------+----------+-------+  FV Prox   Full                                                      +---------+---------------+---------+-----------+----------+-------+  FV Mid    Full                                                      +---------+---------------+---------+-----------+----------+-------+  FV Distal Full                                                      +---------+---------------+---------+-----------+----------+-------+  PFV       Full                                                       +---------+---------------+---------+-----------+----------+-------+  POP       Full            Yes       Yes                             +---------+---------------+---------+-----------+----------+-------+  PTV       Full                                                      +---------+---------------+---------+-----------+----------+-------+  PERO      Full                                                      +---------+---------------+---------+-----------+----------+-------+   +---------+---------------+---------+-----------+----------+-------+  LEFT      Compressibility Phasicity Spontaneity Properties Summary  +---------+---------------+---------+-----------+----------+-------+  CFV       Full            Yes       Yes                             +---------+---------------+---------+-----------+----------+-------+  SFJ       Full                                                      +---------+---------------+---------+-----------+----------+-------+  FV Prox   Full                                                      +---------+---------------+---------+-----------+----------+-------+  FV Mid    Full                                                      +---------+---------------+---------+-----------+----------+-------+  FV Distal Full                                                      +---------+---------------+---------+-----------+----------+-------+  PFV       Full                                                      +---------+---------------+---------+-----------+----------+-------+  POP       Full            Yes       Yes                             +---------+---------------+---------+-----------+----------+-------+  PTV       Full                                                      +---------+---------------+---------+-----------+----------+-------+  PERO      Full                                                      +---------+---------------+---------+-----------+----------+-------+      Summary: Right: There is no evidence of deep vein thrombosis in the lower extremity. Left: There is no evidence of deep vein thrombosis in the lower extremity.  *See table(s) above for measurements and observations.    Preliminary

## 2019-03-07 ENCOUNTER — Inpatient Hospital Stay (HOSPITAL_COMMUNITY): Payer: Medicare HMO

## 2019-03-07 DIAGNOSIS — G9341 Metabolic encephalopathy: Secondary | ICD-10-CM

## 2019-03-07 DIAGNOSIS — I10 Essential (primary) hypertension: Secondary | ICD-10-CM

## 2019-03-07 LAB — COMPREHENSIVE METABOLIC PANEL
ALT: 80 U/L — ABNORMAL HIGH (ref 0–44)
AST: 118 U/L — ABNORMAL HIGH (ref 15–41)
Albumin: 2.8 g/dL — ABNORMAL LOW (ref 3.5–5.0)
Alkaline Phosphatase: 226 U/L — ABNORMAL HIGH (ref 38–126)
Anion gap: 10 (ref 5–15)
BUN: 91 mg/dL — ABNORMAL HIGH (ref 8–23)
CO2: 24 mmol/L (ref 22–32)
Calcium: 8.2 mg/dL — ABNORMAL LOW (ref 8.9–10.3)
Chloride: 109 mmol/L (ref 98–111)
Creatinine, Ser: 2.94 mg/dL — ABNORMAL HIGH (ref 0.44–1.00)
GFR calc Af Amer: 18 mL/min — ABNORMAL LOW (ref >60)
GFR calc non Af Amer: 16 mL/min — ABNORMAL LOW (ref >60)
Glucose, Bld: 267 mg/dL — ABNORMAL HIGH (ref 70–99)
Potassium: 4 mmol/L (ref 3.5–5.1)
Sodium: 143 mmol/L (ref 135–145)
Total Bilirubin: 0.4 mg/dL (ref 0.3–1.2)
Total Protein: 6.8 g/dL (ref 6.5–8.1)

## 2019-03-07 LAB — CBC
HCT: 29.1 % — ABNORMAL LOW (ref 36.0–46.0)
Hemoglobin: 8.8 g/dL — ABNORMAL LOW (ref 12.0–15.0)
MCH: 27.2 pg (ref 26.0–34.0)
MCHC: 30.2 g/dL (ref 30.0–36.0)
MCV: 90.1 fL (ref 80.0–100.0)
Platelets: 210 10*3/uL (ref 150–400)
RBC: 3.23 MIL/uL — ABNORMAL LOW (ref 3.87–5.11)
RDW: 15.5 % (ref 11.5–15.5)
WBC: 38.4 10*3/uL — ABNORMAL HIGH (ref 4.0–10.5)
nRBC: 0.5 % — ABNORMAL HIGH (ref 0.0–0.2)

## 2019-03-07 LAB — TYPE AND SCREEN
ABO/RH(D): A POS
Antibody Screen: NEGATIVE
Unit division: 0

## 2019-03-07 LAB — GLUCOSE, CAPILLARY
Glucose-Capillary: 197 mg/dL — ABNORMAL HIGH (ref 70–99)
Glucose-Capillary: 224 mg/dL — ABNORMAL HIGH (ref 70–99)
Glucose-Capillary: 233 mg/dL — ABNORMAL HIGH (ref 70–99)
Glucose-Capillary: 248 mg/dL — ABNORMAL HIGH (ref 70–99)
Glucose-Capillary: 276 mg/dL — ABNORMAL HIGH (ref 70–99)
Glucose-Capillary: 282 mg/dL — ABNORMAL HIGH (ref 70–99)
Glucose-Capillary: 312 mg/dL — ABNORMAL HIGH (ref 70–99)

## 2019-03-07 LAB — BPAM RBC
Blood Product Expiration Date: 202007182359
ISSUE DATE / TIME: 202007061123
Unit Type and Rh: 6200

## 2019-03-07 LAB — C-REACTIVE PROTEIN: CRP: 1.5 mg/dL — ABNORMAL HIGH (ref <1.0)

## 2019-03-07 LAB — CULTURE, RESPIRATORY W GRAM STAIN: Culture: NORMAL

## 2019-03-07 LAB — LACTATE DEHYDROGENASE: LDH: 644 U/L — ABNORMAL HIGH (ref 98–192)

## 2019-03-07 LAB — FERRITIN: Ferritin: 212 ng/mL (ref 11–307)

## 2019-03-07 MED ORDER — MIDAZOLAM HCL 2 MG/2ML IJ SOLN
1.0000 mg | INTRAMUSCULAR | Status: DC | PRN
  Administered 2019-03-07 – 2019-03-08 (×3): 2 mg via INTRAVENOUS
  Administered 2019-03-08: 1 mg via INTRAVENOUS
  Administered 2019-03-08: 01:00:00 2 mg via INTRAVENOUS
  Administered 2019-03-08: 1 mg via INTRAVENOUS
  Filled 2019-03-07 (×5): qty 2

## 2019-03-07 MED ORDER — FUROSEMIDE 10 MG/ML IJ SOLN
80.0000 mg | Freq: Once | INTRAMUSCULAR | Status: AC
  Administered 2019-03-07: 09:00:00 80 mg via INTRAVENOUS
  Filled 2019-03-07: qty 8

## 2019-03-07 MED ORDER — DEXMEDETOMIDINE HCL IN NACL 400 MCG/100ML IV SOLN
0.0000 ug/kg/h | INTRAVENOUS | Status: AC
  Administered 2019-03-07 – 2019-03-08 (×6): 1.2 ug/kg/h via INTRAVENOUS
  Filled 2019-03-07 (×6): qty 100

## 2019-03-07 NOTE — Progress Notes (Addendum)
Pt has had no output since foley was removed. Bladder scan showed more than 935 mL. In and out cath'd patient using sterile technique. Peri care performed before and after. Removed 1000 mL of urine.

## 2019-03-07 NOTE — Progress Notes (Signed)
NAME:  Nichole Franklin, MRN:  [id number], DOB:  [birth year], LOS: 9 ADMISSION DATE:  01/30/2019, CONSULTATION DATE:  7/4 REFERRING MD:  Aileen Fass, CHIEF COMPLAINT:  Found confused, tongue swelling  Brief History   This is a 68 year old female with an extensive past medical history who was admitted on February 26, 2019 with acute kidney injury weakness and diarrhea in the setting of a new COVID-19 diagnosis.  She was admitted to the [facility] due to her renal failure which improved with IV fluids.  She was transferred to [facility].  On hospital day 6 she developed worsening confusion and tongue swelling after - severe and was transferred to the ICU emergently for intubation.  History of present illness   No further history could be obtained, refer to brief history above  Past Medical History  Hypertension Hyperlipidemia Diabetes mellitus Gastroesophageal reflux disease History of cervical cancer Anxiety  Significant Hospital Events   June 28 admission [facility] from [facility] for AKI, poor po intake June 30 transfer to [facility], confusion July 2: worsening hypoxemia July 3 remdesivir, worsening confusion July 4 tongue swelling, hypertension, severe encephalopathy, transfer to ICU, intubation, new worsening fever - Admitted to the ICU for worsening confusion, required emergent intubation Tongue swelling noted   03/05/2019 -intubated yesterday secondary to angioedema thought secondary to antiviral Remdesivir.  Currently on Precedex gtt and fent gt  No overnight issues.  On 35% oxygen and PEEP of 5 on the ventilator with a pulse ox of 100%.  Not on pressors.  Making urine.  Hemoglobin dropped to 6.3 but recheck was 7.1 g%.  No obvious bleeding.  Is on IV heparin drip for empiric PE. Cannot get CTA due to low gfr  7/6 - improved angioedema, following some commands but no cuff leak in morning rounds and wheezy on exam as well/ BUN worsening.   Consults:  Nephrology, signed off PCCM   Procedures:  July 4 left IJ CVL July 4 endotracheal tube  Significant Diagnostic Tests:  June 28 CT scan abdomen pelvis for nephrolithiasis: No explanation of renal failure, no urinary calculus, extensive multifocal groundglass opacity in lungs, sclerotic inferior endplate deformities of T11 and T12 July 4 CT Head > pending  Micro Data:  7/4 resp culture  Antimicrobials:  June 28 ceftriaxone and azithromycin through July 3 July 4 cefepime, linezolid July 3 remdesivir  Interim history/subjective:   03/07/2019 =- resolved cuff leak. On precedex and following commands. But fails SBT with resp distress and paradoxical respiration despite foley and empying bladder . She is frustrated due to intubation status .bUN/Creat/NA beter after free water but wheeze worse and lasix given by [facility] MD / Duplex - negative  QTC  Now 458msec  Objective   Blood pressure (!) 145/74, pulse 97, temperature 99.1 F (37.3 C), temperature source Oral, resp. rate 14, height 5\' 6"  (1.676 m), weight 80.2 kg, SpO2 99 %.    Vent Mode: PRVC FiO2 (%):  [30 %] 30 % Set Rate:  [24 bmp] 24 bmp Vt Set:  [470 mL] 470 mL PEEP:  [5 cmH20] 5 cmH20 Pressure Support:  [10 cmH20] 10 cmH20 Plateau Pressure:  [17 cmH20-26 cmH20] 26 cmH20   Intake/Output Summary (Last 24 hours) at 03/07/2019 1235 Last data filed at 03/07/2019 1200 Gross per 24 hour  Intake 5491.56 ml  Output 1800 ml  Net 3691.56 ml   Filed Weights   03/04/19 1030 03/06/19 0500 03/07/19 0500  Weight: 74.6 kg 78.8 kg 80.2 kg  General Appearance:  Looks criticall ill OBESE - + Head:  Normocephalic, without obvious abnormality, atraumatic Eyes:  PERRL - yes, conjunctiva/corneas - muddy     Ears:  Normal external ear canals, both ears Nose:  G tube - + Throat:  ETT TUBE - yes , OG tube - yes Neck:  Supple,  No enlargement/tenderness/nodules Lungs: Clear to auscultation bilaterally, Ventilator   Synchrony - NO, PARADOXIAL wth SBT Heart:  S1 and S2 normal,  no murmur, CVP - no.  Pressors - no Abdomen:  Soft, no masses, no organomegaly Genitalia / Rectal:  Not done Extremities:  Extremities- intact Skin:  ntact in exposed areas . Sacral area - not examined Neurologic:  Sedation - precedex -> RASS - +1 . Moves all 4s - yes. CAM-ICU - neg . Orientation - +         Resolved Hospital Problem list      Assessment & Plan:  Acute encephalopathy: Unclear etiology, could be related to steroids, hospital delirium in setting of multiple metabolic derangements, less likely press syndrome or related to direct neurotoxicity from COVID. Ct head 7/4 unremarkable   03/07/2019 - improving mental status . On precedex. Tried to kick MD - daughter reported as her fiesty   PLAN Control blood pressure precedex Prn fent and benzo RASS 0 to -2  Worsening acute respiratory failure with hypoxemia in setting of COVID-19 pneumoni but mainly angioedema issue from remdesivir Suspected Pulmonary embolism 03/02/2019 - baed on D-dimer 7.65  But duppler neative 03/06/2019 (off IV heparin 7/5 due to hgb drop)  7/7 - failed sBT due to ventilatory issue and wheeze. Airway edema resolved - cuff leak +  PLAN Continue Solu-Medrol Mechanical ventilation PRVC - 8cc/kg/IBW - no extubation 03/07/2019 Ventilator associated pneumonia prevention protocol Continue SQ dose proph dose  since 03/05/2019 (changed from IV gtt that was started for presumed PE but now stopped due to hgb drop) or bleeding NO REMDESVIRI (allergy)  Anemia with initial concern for active bleed 03/05/2019 that did not pan out  =- no activle bleed Plan - PRBC x 1  - No IV hep gtt  - - PRBC for hgb </= 6.9gm%    - exceptions are   -  if ACS susepcted/confirmed then transfuse for hgb </= 8.0gm%,  or    -  active bleeding with hemodynamic instability, then transfuse regardless of hemoglobin value   At at all times try to transfuse 1 unit prbc as possible with exception of active hemorrhage   Need for  sedation for mechanical ventilation: Precedex infusion, with prn  fentanyl and Versed as needed for synchrony Hold quetiapine considering QT prolongation  QT prolongation 534 msec on 03/04/2019:  - resolved on EKG 03/07/2019  PLAN Hold quetiapine Hold azithromycin Stopped haldol on 03/06/2019 - can  Retry carefully if needed   Worsening fever 03/04/2019 , hypoxemia: Most likely due to COVID-19, however consider healthcare associated pneumonia. MRSA PCR neg 03/04/2019 . PCT 3 and improved c/w bacterial pna  Plan  - dc [place]  - continue cefpeime   Angioedema: No anaphylaxis, concern for reaction to remdesivir though timing not classic for allergic reaction  7/7 - improved tongue edema with cuff leak. Off benadryl x 24h  plan NO remdesivir - allergy Continue Solu-Medrol Supportive care   Pre-renal azotemia  = ? Due to solumderol   7/7 - improved with free water and doubling of PPI 24h  Earlier  Plan - monitor    Best practice:  Diet: continue  tube feeding Pain/Anxiety/Delirium protocol (if indicated): yes, RASS target -1 to -2 VAP protocol (if indicated): yes DVT prophylaxis: lovenox GI prophylaxis: famotidine Glucose control: SSI Mobility: bed rest Code Status: full  Family Communication: updated her daughter [name] by phone on 7/4 . On 03/05/2019  -daughter [name] on phone. Called her  03/06/2019 and LMTCB . On 03/07/2019 ->   Disposition: remain in [place]   The patient Nichole Franklin is critically ill with multiple organ systems failure and requires high complexity decision making for assessment and support, frequent evaluation and titration of therapies, application of advanced monitoring technologies and extensive interpretation of multiple databases.   Critical Care Time devoted to patient care services described in this note is  30  Minutes. This time reflects time of care of this signee Dr Brand Males. This critical care time  does not reflect procedure time, or teaching time or supervisory time of PA/NP/Med student/Med Resident etc but could involve care discussion time     Dr. Brand Males, M.D., [facility].C.P Pulmonary and Critical Care Medicine Staff Physician [facility] [place] Pulmonary and Critical Care Pager: [phone], If no answer or between  15:00h - 7:00h: call [phone]  03/07/2019 12:35 PM   LABS    PULMONARY Recent Labs  Lab 03/04/19 1100 03/05/19 0836  PHART 7.430 7.361  PCO2ART 36.0 40.7  PO2ART 270.0* 87.0  HCO3 23.8 23.0  TCO2 25 24  O2SAT 100.0 96.0    CBC Recent Labs  Lab 03/06/19 0430 03/06/19 1750 03/07/19 0425  HGB 6.7* 8.5* 8.8*  HCT 23.3* 27.4* 29.1*  WBC 33.0* 37.0* 38.4*  PLT 192 188 210    COAGULATION No results for input(s): INR in the last 168 hours.  CARDIAC  No results for input(s): TROPONINI in the last 168 hours. No results for input(s): PROBNP in the last 168 hours.   CHEMISTRY Recent Labs  Lab 03/03/19 0330 03/03/19 0954 03/04/19 0131 03/04/19 1100 03/04/19 1558 03/05/19 0408 03/05/19 0836 03/05/19 1700 03/06/19 0430 03/07/19 0425  NA  --  144 149* 150*  --  151* 151*  --  146* 143  K  --  3.4* 3.5 3.5  --  4.7 4.9  --  4.3 4.0  CL  --  103 107  --   --  117*  --   --  113* 109  CO2  --  25 25  --   --  23  --   --  24 24  GLUCOSE  --  291* 230*  --   --  220*  --   --  260* 267*  BUN  --  76* 79*  --   --  92*  --   --  100* 91*  CREATININE  --  3.46* 3.23*  --   --  3.26*  --   --  3.21* 2.94*  CALCIUM  --  7.1* 7.6*  --   --  7.3*  --   --  7.7* 8.2*  MG 2.0  --  2.1  --  1.9 1.9  --  2.6*  --   --   PHOS  --   --   --   --  4.3 3.5  --  3.7  --   --    Estimated Creatinine Clearance: 19.6 mL/min (A) (by C-G formula based on SCr of 2.94 mg/dL (H)).   LIVER Recent Labs  Lab 03/04/19  0131 03/05/19 0408 03/05/19 0700 03/06/19 0430 03/07/19 0425  AST 104* 65* 51* 44* 118*  ALT 51* 53* 52* 44 80*  ALKPHOS  197* 161* 174* 154* 226*  BILITOT 0.9 0.4 0.6 0.3 0.4  PROT 7.0 5.4* 6.1* 5.9* 6.8  ALBUMIN 2.9* 2.4* 2.6* 2.5* 2.8*     INFECTIOUS Recent Labs  Lab 03/01/19 0413 03/05/19 0408  PROCALCITON 3.36 0.94     ENDOCRINE CBG (last 3)  Recent Labs    03/07/19 0413 03/07/19 0747 03/07/19 1157  GLUCAP 224* 197* 276*         IMAGING x48h  - image(s) personally visualized  -   highlighted in bold Dg Chest Port 1 View  Result Date: 03/07/2019 CLINICAL DATA:  Shortness of breath. EXAM: PORTABLE CHEST 1 VIEW COMPARISON:  03/04/2019. FINDINGS: Endotracheal tube, left IJ line, NG tube in stable position. Heart size stable. Unchanged bilateral interstitial prominence. Improved aeration in both lung bases. No pleural effusion or pneumothorax. IMPRESSION: 1.  Lines and tubes in stable position. 2. Persistent bilateral interstitial prominence unchanged. Improved aeration both lung bases. Electronically Signed   By: Marcello Moores  Register   On: 03/07/2019 06:43   Vas [place] Lower Extremity Venous (dvt)  Result Date: 03/06/2019  Lower Venous Study Indications: Swelling, Edema, and Fever and Positive Covid 19.  Comparison Study: No prior. Performing Technologist: Oda Cogan RDMS, RVT  Examination Guidelines: A complete evaluation includes B-mode imaging, spectral Doppler, color Doppler, and power Doppler as needed of all accessible portions of each vessel. Bilateral testing is considered an integral part of a complete examination. Limited examinations for reoccurring indications may be performed as noted.  +---------+---------------+---------+-----------+----------+-------+ RIGHT    CompressibilityPhasicitySpontaneityPropertiesSummary +---------+---------------+---------+-----------+----------+-------+ CFV      Full           Yes      Yes                          +---------+---------------+---------+-----------+----------+-------+ SFJ      Full                                                  +---------+---------------+---------+-----------+----------+-------+ FV Prox  Full                                                 +---------+---------------+---------+-----------+----------+-------+ FV Mid   Full                                                 +---------+---------------+---------+-----------+----------+-------+ FV DistalFull                                                 +---------+---------------+---------+-----------+----------+-------+ PFV      Full                                                 +---------+---------------+---------+-----------+----------+-------+  POP      Full           Yes      Yes                          +---------+---------------+---------+-----------+----------+-------+ PTV      Full                                                 +---------+---------------+---------+-----------+----------+-------+ PERO     Full                                                 +---------+---------------+---------+-----------+----------+-------+   +---------+---------------+---------+-----------+----------+-------+ LEFT     CompressibilityPhasicitySpontaneityPropertiesSummary +---------+---------------+---------+-----------+----------+-------+ CFV      Full           Yes      Yes                          +---------+---------------+---------+-----------+----------+-------+ SFJ      Full                                                 +---------+---------------+---------+-----------+----------+-------+ FV Prox  Full                                                 +---------+---------------+---------+-----------+----------+-------+ FV Mid   Full                                                 +---------+---------------+---------+-----------+----------+-------+ FV DistalFull                                                 +---------+---------------+---------+-----------+----------+-------+ PFV       Full                                                 +---------+---------------+---------+-----------+----------+-------+ POP      Full           Yes      Yes                          +---------+---------------+---------+-----------+----------+-------+ PTV      Full                                                 +---------+---------------+---------+-----------+----------+-------+ PERO  Full                                                 +---------+---------------+---------+-----------+----------+-------+     Summary: Right: There is no evidence of deep vein thrombosis in the lower extremity. Left: There is no evidence of deep vein thrombosis in the lower extremity.  *See table(s) above for measurements and observations. Electronically signed by Ruta Hinds MD on 03/06/2019 at 8:38:47 PM.    Final

## 2019-03-07 NOTE — Progress Notes (Signed)
Called and updated patients daughter, [name]. All questions were answered. She thanked [place] for the communication and care her mother is receiving. She also stated the patient likes the song, "I Smile" by Laure Kidney. Will play the song for the patient tonight.

## 2019-03-07 NOTE — Progress Notes (Addendum)
PROGRESS NOTE                                                                                                                                                                                                             Patient Demographics:    Nichole Franklin, is a 68 y.o. female, DOB - 1951/03/05, QJF:[id number]  Outpatient Primary MD for the patient is Elwyn Reach, MD    LOS - 9  No chief complaint on file.      Brief Narrative: Patient is a 68 y.o. female with PMHx of DM-2, HTN, dyslipidemia, CKD stage IV who presented to the hospital initially for evaluation of weakness, she was found to have fever and acute kidney injury.  She was subsequently admitted to [facility] her renal function stabilized-she was transferred to [facility] course since admission has been complicated by encephalopathy, angioedema following initiation of Remdesivir-and respiratory failure requiring intubation on 7/4.   Subjective:    Nichole Franklin today was awake and following commands while on the ventilator.  She briefly tolerated the CPAP trial-but then became tachypneic.   Assessment  & Plan :   Acute hypoxemic respiratory failure secondary to COVID-19 and presumed superimposed bacterial infection: Overall improved-briefly tolerated the CPAP trial but then became tachypneic.  Is much more awake and alert compared to the past few days.  Remains on empiric cefepime for superimposed bacterial pneumonia.  Was on Remdesivir-but this was discontinued after patient developed angioedema.  Continue IV steroids.  PCCM contemplating extubation over the next day or so if patient passes a CPAP trial.  Has some rales and coarse rhonchi-continue bronchodilators-we will give 1 dose of Lasix today.  COVID-19 Labs:  Recent Labs    03/05/19 0408 03/06/19 0430 03/07/19 0425  FERRITIN 144 145 212  LDH 603* 537* 644*  CRP  1.1* 1.1* 1.5*    Lab Results  Component Value Date   SARSCOV2NAA POSITIVE (A) 02/21/2019     COVID-19 Medications:  Vent Settings: Vent Mode: PRVC FiO2 (%):  [30 %] 30 % Set Rate:  [24 bmp] 24 bmp Vt Set:  [470 mL] 470 mL PEEP:  [5 cmH20] 5 cmH20 Pressure Support:  [10 cmH20] 10 cmH20 Plateau Pressure:  [17 cmH20-26 cmH20] 26 cmH20  ABG:    Component Value Date/Time   PHART  7.361 03/05/2019 0836   PCO2ART 40.7 03/05/2019 0836   PO2ART 87.0 03/05/2019 0836   HCO3 23.0 03/05/2019 0836   TCO2 24 03/05/2019 0836   ACIDBASEDEF 2.0 03/05/2019 0836   O2SAT 96.0 03/05/2019 9629    Acute metabolic encephalopathy: Suspected etiology felt to be worsening hypoxemia, underlying COVID/bacterial pneumonia and AKI.  CT head on 7/4 without any acute abnormalities.  This morning-while off sedation and on a CPAP trial-completely awake and following commands.   Angioedema: This occurred on 7/4-patient was started on Remdesivir on 7/3-tongue swelling has improved. No longer on remdesivir-which is the suspected etiology,.  AKI on CKD stage IV: AKI thought to be hemodynamically mediated in the setting of COVID-19 infection-ACE/HCTZ use.  Both BUN and creatinine are slowly downtrending-BUN could be artificially elevated in the setting of steroids and tube feeds.  Good urine output-continue to follow electrolytes closely.  If renal function worsens-we will need nephrology input at some point.  Hypernatremia: Resolved with free water to OG tube and gentle hydration with half-normal saline.  DM-2 with uncontrolled hyperglycemia: No longer on Insulin infusion-CBG's stable-continue Lantus 25 units and SSI  HTN: Blood pressure better controlled today-continue amlodipine and labetalol  Anemia: Likely secondary to acute illness and renal failure.  Was transfused 1 unit of PRBC on 7/6.  Hemoglobin currently stable.  The BUN is significantly elevated-there is no evidence of melena or hematochezia.  Follow  for now.  Remains on PPI.    Elevated d-dimer: She was empirically started on IV heparin on 7/4 due to concern for VTE-however given drop in hemoglobin-she was switched to prophylactic heparin.  Lower extremity Doppler negative for DVT-unable to pursue CT angio chest due to renal function-but have low suspicion for VTE at this point.  Elevated d-dimer is likely secondary to underlying COVID-19  Condition - Guarded  Family Communication  :  Per PCCM  Code Status :  Full Code  Diet :  Diet Order            Diet NPO time specified Except for: Sips with Meds  Diet effective now               Disposition Plan  :  Remain inpatient-remain in ICU  Consults  :  PCCM  Procedures  :    ETT>>7/4 Left IJ>>7/4  GI prophylaxis: ppi  DVT Prophylaxis  : Prophylactic Heparin     Lab Results  Component Value Date   PLT 210 03/07/2019    Inpatient Medications  Scheduled Meds:  sodium chloride   Intravenous Once   amLODipine  10 mg Per Tube Daily   chlorhexidine gluconate (MEDLINE KIT)  15 mL Mouth Rinse BID   Chlorhexidine Gluconate Cloth  6 each Topical Q0600   cholecalciferol  1,000 Units Per Tube Daily   feeding supplement (PRO-STAT SUGAR FREE 64)  30 mL Per Tube BID   ferrous sulfate  300 mg Per Tube BID WC   free water  300 mL Per Tube Q4H   heparin injection (subcutaneous)  7,500 Units Subcutaneous Q8H   insulin aspart  0-20 Units Subcutaneous Q4H   insulin glargine  25 Units Subcutaneous BID   ipratropium-albuterol  3 mL Nebulization Q6H   labetalol  10 mg Intravenous Q8H   mouth rinse  15 mL Mouth Rinse 10 times per day   methylPREDNISolone (SOLU-MEDROL) injection  30 mg Intravenous Q12H   pantoprazole (PROTONIX) IV  40 mg Intravenous Q12H   vitamin C  1,000 mg Per Tube  Daily   zinc sulfate  220 mg Per Tube Daily   Continuous Infusions:  sodium chloride 10 mL/hr at 03/07/19 1257   ceFEPime (MAXIPIME) IV Stopped (03/07/19 0920)   dexmedetomidine  (PRECEDEX) IV infusion 1.2 mcg/kg/hr (03/07/19 1252)   famotidine (PEPCID) IV Stopped (03/07/19 1003)   feeding supplement (VITAL AF 1.2 CAL) 1,000 mL (03/07/19 0910)   PRN Meds:.acetaminophen **OR** acetaminophen, dextrose, EPINEPHrine, fentaNYL (SUBLIMAZE) injection, hydrALAZINE, midazolam, midazolam, midazolam, ondansetron **OR** ondansetron (ZOFRAN) IV  Antibiotics  :    Anti-infectives (From admission, onward)   Start     Dose/Rate Route Frequency Ordered Stop   03/07/19 1000  ceFEPIme (MAXIPIME) 2 g in sodium chloride 0.9 % 100 mL IVPB     2 g 200 mL/hr over 30 Minutes Intravenous Every 24 hours 03/06/19 1434     03/04/19 2200  ceFEPIme (MAXIPIME) 1 g in sodium chloride 0.9 % 100 mL IVPB  Status:  Discontinued     1 g 200 mL/hr over 30 Minutes Intravenous Every 12 hours 03/04/19 1256 03/06/19 1434   03/04/19 1400  remdesivir 100 mg in sodium chloride 0.9 % 250 mL IVPB  Status:  Discontinued     100 mg 500 mL/hr over 30 Minutes Intravenous Every 24 hours 03/03/19 1305 03/04/19 0936   03/04/19 1200  linezolid (ZYVOX) IVPB 600 mg  Status:  Discontinued     600 mg 300 mL/hr over 60 Minutes Intravenous Every 12 hours 03/04/19 1141 03/05/19 1100   03/04/19 0900  linezolid (ZYVOX) IVPB 600 mg  Status:  Discontinued     600 mg 300 mL/hr over 60 Minutes Intravenous Every 12 hours 03/04/19 0759 03/04/19 1141   03/04/19 0800  ceFEPIme (MAXIPIME) 2 g in sodium chloride 0.9 % 100 mL IVPB  Status:  Discontinued     2 g 200 mL/hr over 30 Minutes Intravenous Every 12 hours 03/04/19 0706 03/04/19 1256   03/03/19 1400  remdesivir 200 mg in sodium chloride 0.9 % 250 mL IVPB     200 mg 500 mL/hr over 30 Minutes Intravenous Once 03/03/19 1305 03/03/19 1500   02/27/19 2000  azithromycin (ZITHROMAX) 500 mg in sodium chloride 0.9 % 250 mL IVPB  Status:  Discontinued     500 mg 250 mL/hr over 60 Minutes Intravenous Every 24 hours 02/27/19 0428 03/04/19 1211   02/27/19 0430  cefTRIAXone (ROCEPHIN) 1  g in sodium chloride 0.9 % 100 mL IVPB  Status:  Discontinued     1 g 200 mL/hr over 30 Minutes Intravenous Every 24 hours 02/27/19 0428 03/04/19 0706       Time Spent in minutes  45   Oren Binet M.D on 03/07/2019 at 2:39 PM  To page go to [email or web address] - use universal password  [facility] -  Office  [phone]  The patient is critically ill with multiple organ system failure and requires high complexity decision making for assessment and support, frequent evaluation and titration of therapies, advanced monitoring, review of radiographic studies and interpretation of complex data.  Admit date - 02/22/2019    9    Objective:   Vitals:   03/07/19 1100 03/07/19 1200 03/07/19 1300 03/07/19 1400  BP: (!) 161/104 (!) 145/74 124/64 (!) 142/75  Pulse: (!) 103 97 73 81  Resp: (!) 32 14 (!) 23 (!) 23  Temp:  99.1 F (37.3 C)    TempSrc:  Oral    SpO2: 99% 99% 100% 100%  Weight:      Height:  Wt Readings from Last 3 Encounters:  03/07/19 80.2 kg  02/01/2019 72.6 kg  05/08/17 68 kg     Intake/Output Summary (Last 24 hours) at 03/07/2019 1439 Last data filed at 03/07/2019 1200 Gross per 24 hour  Intake 5491.56 ml  Output 1800 ml  Net 3691.56 ml     Physical Exam General appearance:Awake while on the ventilator-follows commands.  Neck and tachycardic. Eyes:no scleral icterus. HEENT: Atraumatic and Normocephalic Neck: supple, no JVD. Resp:Good air entry bilaterally, coarse rhonchi and rales all over CVS: S1 S2 regular, no murmurs.  GI: Bowel sounds present, Non tender and not distended with no gaurding, rigidity or rebound. Extremities: B/L Lower Ext shows no edema, both legs are warm to touch   Data Review:    CBC Recent Labs  Lab 03/01/19 0413 03/02/19 0300  03/04/19 0131  03/05/19 0408 03/05/19 0700 03/05/19 0836 03/06/19 0430 03/06/19 1750 03/07/19 0425  WBC 19.7* 18.7*   < > 23.2*  --  21.1*  --   --  33.0* 37.0* 38.4*  HGB 8.1* 8.1*   <  > 8.1*   < > 6.3* 7.1* 7.1* 6.7* 8.5* 8.8*  HCT 24.7* 25.3*   < > 26.2*   < > 21.8* 23.4* 21.0* 23.3* 27.4* 29.1*  PLT 273 256   < > 256  --  198  --   --  192 188 210  MCV 82.6 84.9   < > 87.0  --  91.6  --   --  92.1 91.9 90.1  MCH 27.1 27.2   < > 26.9  --  26.5  --   --  26.5 28.5 27.2  MCHC 32.8 32.0   < > 30.9  --  28.9*  --   --  28.8* 31.0 30.2  RDW 16.1* 15.9*   < > 15.7*  --  16.4*  --   --  16.3* 15.4 15.5  LYMPHSABS 1.4 1.8  --  1.7  --   --   --   --   --   --   --   MONOABS 1.1* 1.8*  --  1.9*  --   --   --   --   --   --   --   EOSABS 0.0 0.0  --  0.0  --   --   --   --   --   --   --   BASOSABS 0.0 0.0  --  0.1  --   --   --   --   --   --   --    < > = values in this interval not displayed.    Chemistries  Recent Labs  Lab 03/03/19 0330 03/03/19 0954 03/04/19 0131 03/04/19 1100 03/04/19 1558 03/05/19 0408 03/05/19 0700 03/05/19 0836 03/05/19 1700 03/06/19 0430 03/07/19 0425  NA  --  144 149* 150*  --  151*  --  151*  --  146* 143  K  --  3.4* 3.5 3.5  --  4.7  --  4.9  --  4.3 4.0  CL  --  103 107  --   --  117*  --   --   --  113* 109  CO2  --  25 25  --   --  23  --   --   --  24 24  GLUCOSE  --  291* 230*  --   --  220*  --   --   --  260* 267*  BUN  --  76* 79*  --   --  92*  --   --   --  100* 91*  CREATININE  --  3.46* 3.23*  --   --  3.26*  --   --   --  3.21* 2.94*  CALCIUM  --  7.1* 7.6*  --   --  7.3*  --   --   --  7.7* 8.2*  MG 2.0  --  2.1  --  1.9 1.9  --   --  2.6*  --   --   AST  --   --  104*  --   --  65* 51*  --   --  44* 118*  ALT  --   --  51*  --   --  53* 52*  --   --  44 80*  ALKPHOS  --   --  197*  --   --  161* 174*  --   --  154* 226*  BILITOT  --   --  0.9  --   --  0.4 0.6  --   --  0.3 0.4   ------------------------------------------------------------------------------------------------------------------ No results for input(s): CHOL, HDL, LDLCALC, TRIG, CHOLHDL, LDLDIRECT in the last 72 hours.  Lab Results  Component Value  Date   HGBA1C 9.3 (H) 02/27/2019   ------------------------------------------------------------------------------------------------------------------ No results for input(s): TSH, T4TOTAL, T3FREE, THYROIDAB in the last 72 hours.  Invalid input(s): FREET3 ------------------------------------------------------------------------------------------------------------------ Recent Labs    03/06/19 0430 03/07/19 0425  FERRITIN 145 212    Coagulation profile No results for input(s): INR, PROTIME in the last 168 hours.  No results for input(s): DDIMER in the last 72 hours.  Cardiac Enzymes No results for input(s): CKMB, TROPONINI, MYOGLOBIN in the last 168 hours.  Invalid input(s): CK ------------------------------------------------------------------------------------------------------------------    Component Value Date/Time   BNP 151.7 (H) 03/05/2019 0409    Micro Results Recent Results (from the past 240 hour(s))  SARS Coronavirus 2 (CEPHEID- Performed in [place] hospital lab), Hosp Order     Status: Abnormal   Collection Time: 02/25/2019 12:47 PM   Specimen: Nasopharyngeal Swab  Result Value Ref Range Status   SARS Coronavirus 2 POSITIVE (A) NEGATIVE Final    Comment: RESULT CALLED TO, READ BACK BY AND VERIFIED WITH: KIM MAIN '@1410'$  ON 02/05/2019 BY FMW (NOTE) If result is NEGATIVE SARS-CoV-2 target nucleic acids are NOT DETECTED. The SARS-CoV-2 RNA is generally detectable in upper and lower  respiratory specimens during the acute phase of infection. The lowest  concentration of SARS-CoV-2 viral copies this assay can detect is 250  copies / mL. A negative result does not preclude SARS-CoV-2 infection  and should not be used as the sole basis for treatment or other  patient management decisions.  A negative result may occur with  improper specimen collection / handling, submission of specimen other  than nasopharyngeal swab, presence of viral mutation(s) within the  areas  targeted by this assay, and inadequate number of viral copies  (<250 copies / mL). A negative result must be combined with clinical  observations, patient history, and epidemiological information. If result is POSITIVE SARS-CoV-2 target nucleic acids are DETECTED. T he SARS-CoV-2 RNA is generally detectable in upper and lower  respiratory specimens during the acute phase of infection.  Positive  results are indicative of active infection with SARS-CoV-2.  Clinical  correlation with patient history and other diagnostic information is  necessary to determine patient infection  status.  Positive results do  not rule out bacterial infection or co-infection with other viruses. If result is PRESUMPTIVE POSTIVE SARS-CoV-2 nucleic acids MAY BE PRESENT.   A presumptive positive result was obtained on the submitted specimen  and confirmed on repeat testing.  While 2019 novel coronavirus  (SARS-CoV-2) nucleic acids may be present in the submitted sample  additional confirmatory testing may be necessary for epidemiological  and / or clinical management purposes  to differentiate between  SARS-CoV-2 and other Sarbecovirus currently known to infect humans.  If clinically indicated additional testing with an alternate test  methodology [phone]) is  advised. The SARS-CoV-2 RNA is generally  detectable in upper and lower respiratory specimens during the acute  phase of infection. The expected result is Negative. Fact Sheet for Patients:  StrictlyIdeas.no Fact Sheet for Healthcare Providers: BankingDealers.co.za This test is not yet approved or cleared by the [place] FDA and has been authorized for detection and/or diagnosis of SARS-CoV-2 by FDA under an Emergency Use Authorization (EUA).  This EUA will remain in effect (meaning this test can be used) for the duration of the COVID-19 declaration under Section 564(b)(1) of the Act, 21 U.S.C. section  360bbb-3(b)(1), unless the authorization is terminated or revoked sooner. Performed at [facility], [address] [place], [place] [address]   Culture, blood (Routine X 2) w Reflex to ID Panel     Status: None   Collection Time: 01/30/2019  3:54 PM   Specimen: BLOOD  Result Value Ref Range Status   Specimen Description BLOOD BLOOD LEFT FOREARM  Final   Special Requests   Final    BOTTLES DRAWN AEROBIC AND ANAEROBIC Blood Culture results may not be optimal due to an excessive volume of blood received in culture bottles   Culture   Final    NO GROWTH 5 DAYS Performed at [facility], [address] [place], [place] [address]    Report Status 03/03/2019 FINAL  Final  Culture, blood (Routine X 2) w Reflex to ID Panel     Status: None   Collection Time: 02/06/2019  3:54 PM   Specimen: BLOOD  Result Value Ref Range Status   Specimen Description BLOOD LEFT ANTECUBITAL  Final   Special Requests   Final    BOTTLES DRAWN AEROBIC AND ANAEROBIC Blood Culture results may not be optimal due to an excessive volume of blood received in culture bottles   Culture   Final    NO GROWTH 5 DAYS Performed at [facility], [address] [place], [place] [address]    Report Status 03/03/2019 FINAL  Final  Culture, respiratory (non-expectorated)     Status: None   Collection Time: 03/04/19 11:41 AM   Specimen: Tracheal Aspirate; Respiratory  Result Value Ref Range Status   Specimen Description   Final    TRACHEAL ASPIRATE Performed at [place] [address], [place], [place] [address]    Special Requests   Final    NONE Performed at [facility], [place] [address], [place], [place] [address]    Gram Stain   Final    RARE WBC PRESENT, PREDOMINANTLY PMN MODERATE SQUAMOUS EPITHELIAL CELLS PRESENT ABUNDANT GRAM POSITIVE COCCI    Culture   Final    MODERATE Consistent with normal respiratory flora. Performed at [facility], [place] [address] [place], [place] [address]    Report Status 03/07/2019 FINAL  Final  MRSA PCR Screening  Status: None   Collection Time: 03/04/19  7:08 PM   Specimen: Nasal Mucosa; Nasopharyngeal  Result Value Ref Range Status   MRSA by PCR NEGATIVE NEGATIVE Final    Comment:        The GeneXpert MRSA Assay (FDA approved for NASAL specimens only), is one component of a comprehensive MRSA colonization surveillance program. It is not intended to diagnose MRSA infection nor to guide or monitor treatment for MRSA infections. Performed at [facility], [place] [address], [place], [place] [address]     Radiology Reports Dg Abd 1 View  Result Date: 03/04/2019 CLINICAL DATA:  Orogastric placement EXAM: ABDOMEN - 1 VIEW COMPARISON:  None. FINDINGS: Orogastric tube enters the stomach in has its tip in the fundus. Gas pattern is unremarkable. IMPRESSION: Orogastric tube tip in the fundus of the stomach. Electronically Signed   By: Nelson Chimes M.D.   On: 03/04/2019 11:40   Ct Head Wo Contrast  Result Date: 03/04/2019 CLINICAL DATA:  68 year old female with COVID-19.  Confusion. EXAM: CT HEAD WITHOUT CONTRAST TECHNIQUE: Contiguous axial images were obtained from the base of the skull through the vertex without intravenous contrast. COMPARISON:  Cervical spine radiographs 08/07/2010. FINDINGS: Brain: No midline shift, mass effect, or evidence of intracranial mass lesion. No ventriculomegaly. No acute intracranial hemorrhage identified. Patchy bilateral cerebral white matter hypodensity most pronounced at the frontal horns. Deep gray matter nuclei, brainstem and cerebellum appear negative. No cortical encephalomalacia. No cortically based acute infarct identified. Vascular: Calcified atherosclerosis at the skull base. No suspicious intracranial vascular hyperdensity. Skull: No acute osseous abnormality identified. Sinuses/Orbits: Trace sinus mucosal thickening, most  pronounced in the right sphenoid. The tympanic cavities are clear. There are mild bilateral mastoid effusions. Other: The patient is intubated on the scout view. There is fluid in the visible pharynx. No acute orbit or scalp soft tissue findings. IMPRESSION: 1.  No acute intracranial abnormality. 2. Mild to moderate for age nonspecific white matter changes, most commonly due to chronic small vessel disease. Electronically Signed   By: Genevie Ann M.D.   On: 03/04/2019 18:15   Dg Chest Port 1 View  Result Date: 03/07/2019 CLINICAL DATA:  Shortness of breath. EXAM: PORTABLE CHEST 1 VIEW COMPARISON:  03/04/2019. FINDINGS: Endotracheal tube, left IJ line, NG tube in stable position. Heart size stable. Unchanged bilateral interstitial prominence. Improved aeration in both lung bases. No pleural effusion or pneumothorax. IMPRESSION: 1.  Lines and tubes in stable position. 2. Persistent bilateral interstitial prominence unchanged. Improved aeration both lung bases. Electronically Signed   By: Marcello Moores  Register   On: 03/07/2019 06:43   Dg Chest Port 1 View  Result Date: 03/04/2019 CLINICAL DATA:  COVID-19 positive. Endotracheal and enteric tube placement. EXAM: PORTABLE CHEST 1 VIEW COMPARISON:  Chest x-ray dated February 28, 2019. FINDINGS: Interval placement of an endotracheal tube with the tip 3.0 cm above the carina. New enteric tube entering the stomach with the tip in the fundus. New left internal jugular central venous catheter with tip in the proximal SVC. Stable cardiomediastinal silhouette. Normal pulmonary vascularity. Patchy bilateral airspace disease has improved. New left basilar atelectasis. No pleural effusion or pneumothorax. No acute osseous abnormality. IMPRESSION: 1. Appropriately positioned endotracheal and enteric tubes. 2. Improving COVID-19 pneumonia. Electronically Signed   By: Titus Dubin M.D.   On: 03/04/2019 11:42   Dg Chest Port 1 View  Result Date: 02/28/2019 CLINICAL DATA:  Hypoxia,  COVID-19 EXAM: PORTABLE CHEST 1 VIEW COMPARISON:  Portable exam 0711  hours compared to 02/21/2019 FINDINGS: Stable heart size mediastinal contours. Patchy airspace infiltrates bilaterally, LEFT greater than RIGHT, consistent with multifocal pneumonia and diagnosis of COVID-19. Infiltrates appear mildly increased since previous exam. No pleural effusion or pneumothorax. Bones demineralized. IMPRESSION: BILATERAL airspace infiltrates consistent with pneumonia and diagnosis of COVID-19, mildly increased since previous exam. Electronically Signed   By: Lavonia Dana M.D.   On: 02/28/2019 07:38   Dg Chest Port 1 View  Result Date: 02/18/2019 CLINICAL DATA:  Shortness of breath and cough. EXAM: PORTABLE CHEST 1 VIEW COMPARISON:  02/17/2011 FINDINGS: Patchy bilateral pulmonary opacity with interstitial coarsening. Normal heart size for technique. No effusion or pneumothorax. IMPRESSION: Patchy bilateral infiltrate concerning for pneumonia-especially COVID-19. Electronically Signed   By: Monte Fantasia M.D.   On: 01/30/2019 13:04   Ct Renal Stone Study  Result Date: 02/04/2019 CLINICAL DATA:  Shortness of breath, dry cough, COVID-19, renal failure EXAM: CT ABDOMEN AND PELVIS WITHOUT CONTRAST TECHNIQUE: Multidetector CT imaging of the abdomen and pelvis was performed following the standard protocol without IV contrast. COMPARISON:  05/08/2017 FINDINGS: Lower chest: Extensive, multifocal, predominantly subpleural ground-glass pulmonary opacity of the bilateral lung bases. Hepatobiliary: No focal liver abnormality is seen. Status post cholecystectomy. No biliary dilatation. Pancreas: Unremarkable. No pancreatic ductal dilatation or surrounding inflammatory changes. Spleen: Normal in size without significant abnormality. Adrenals/Urinary Tract: Adrenal glands are unremarkable. Kidneys are normal, without renal calculi, solid lesion, or hydronephrosis. Bladder is unremarkable. Stomach/Bowel: Stomach is within normal  limits. Appendix appears normal. No evidence of bowel wall thickening, distention, or inflammatory changes. Colonic diverticulosis. Vascular/Lymphatic: Aortic atherosclerosis. No enlarged abdominal or pelvic lymph nodes. Reproductive: Status post hysterectomy. Other: No abdominal wall hernia or abnormality. No abdominopelvic ascites. Musculoskeletal: There are partially sclerotic inferior endplate deformities of the T11 and T12 vertebral bodies, new compared to prior examination. IMPRESSION: 1. No acute noncontrast CT findings of the abdomen or pelvis to explain renal failure. No evidence of urinary tract calculus or hydronephrosis. 2. Extensive, multifocal, predominantly subpleural ground-glass pulmonary opacity of the bilateral lung bases, findings in keeping with reported diagnosis of COVID-19. 3. There are partially sclerotic inferior endplate deformities of the T11 and T12 vertebral bodies, new compared to prior examination, although of uncertain acuity. Correlate for acute pain and point tenderness. 4. Other chronic, incidental, and postoperative findings as detailed above. Electronically Signed   By: Eddie Candle M.D.   On: 02/10/2019 14:58   Vas [place] Lower Extremity Venous (dvt)  Result Date: 03/06/2019  Lower Venous Study Indications: Swelling, Edema, and Fever and Positive Covid 19.  Comparison Study: No prior. Performing Technologist: Oda Cogan RDMS, RVT  Examination Guidelines: A complete evaluation includes B-mode imaging, spectral Doppler, color Doppler, and power Doppler as needed of all accessible portions of each vessel. Bilateral testing is considered an integral part of a complete examination. Limited examinations for reoccurring indications may be performed as noted.  +---------+---------------+---------+-----------+----------+-------+  RIGHT     Compressibility Phasicity Spontaneity Properties Summary  +---------+---------------+---------+-----------+----------+-------+  CFV       Full             Yes       Yes                             +---------+---------------+---------+-----------+----------+-------+  SFJ       Full                                                      +---------+---------------+---------+-----------+----------+-------+  FV Prox   Full                                                      +---------+---------------+---------+-----------+----------+-------+  FV Mid    Full                                                      +---------+---------------+---------+-----------+----------+-------+  FV Distal Full                                                      +---------+---------------+---------+-----------+----------+-------+  PFV       Full                                                      +---------+---------------+---------+-----------+----------+-------+  POP       Full            Yes       Yes                             +---------+---------------+---------+-----------+----------+-------+  PTV       Full                                                      +---------+---------------+---------+-----------+----------+-------+  PERO      Full                                                      +---------+---------------+---------+-----------+----------+-------+   +---------+---------------+---------+-----------+----------+-------+  LEFT      Compressibility Phasicity Spontaneity Properties Summary  +---------+---------------+---------+-----------+----------+-------+  CFV       Full            Yes       Yes                             +---------+---------------+---------+-----------+----------+-------+  SFJ       Full                                                      +---------+---------------+---------+-----------+----------+-------+  FV Prox   Full                                                      +---------+---------------+---------+-----------+----------+-------+    FV Mid    Full                                                       +---------+---------------+---------+-----------+----------+-------+  FV Distal Full                                                      +---------+---------------+---------+-----------+----------+-------+  PFV       Full                                                      +---------+---------------+---------+-----------+----------+-------+  POP       Full            Yes       Yes                             +---------+---------------+---------+-----------+----------+-------+  PTV       Full                                                      +---------+---------------+---------+-----------+----------+-------+  PERO      Full                                                      +---------+---------------+---------+-----------+----------+-------+     Summary: Right: There is no evidence of deep vein thrombosis in the lower extremity. Left: There is no evidence of deep vein thrombosis in the lower extremity.  *See table(s) above for measurements and observations. Electronically signed by Ruta Hinds MD on 03/06/2019 at 8:38:47 PM.    Final

## 2019-03-08 ENCOUNTER — Inpatient Hospital Stay (HOSPITAL_COMMUNITY): Payer: Medicare HMO

## 2019-03-08 DIAGNOSIS — D649 Anemia, unspecified: Secondary | ICD-10-CM

## 2019-03-08 LAB — CBC
HCT: 25.4 % — ABNORMAL LOW (ref 36.0–46.0)
Hemoglobin: 7.9 g/dL — ABNORMAL LOW (ref 12.0–15.0)
MCH: 27.6 pg (ref 26.0–34.0)
MCHC: 31.1 g/dL (ref 30.0–36.0)
MCV: 88.8 fL (ref 80.0–100.0)
Platelets: 195 10*3/uL (ref 150–400)
RBC: 2.86 MIL/uL — ABNORMAL LOW (ref 3.87–5.11)
RDW: 15.8 % — ABNORMAL HIGH (ref 11.5–15.5)
WBC: 35.7 10*3/uL — ABNORMAL HIGH (ref 4.0–10.5)
nRBC: 0.2 % (ref 0.0–0.2)

## 2019-03-08 LAB — COMPREHENSIVE METABOLIC PANEL
ALT: 65 U/L — ABNORMAL HIGH (ref 0–44)
AST: 35 U/L (ref 15–41)
Albumin: 2.6 g/dL — ABNORMAL LOW (ref 3.5–5.0)
Alkaline Phosphatase: 192 U/L — ABNORMAL HIGH (ref 38–126)
Anion gap: 10 (ref 5–15)
BUN: 97 mg/dL — ABNORMAL HIGH (ref 8–23)
CO2: 23 mmol/L (ref 22–32)
Calcium: 8.3 mg/dL — ABNORMAL LOW (ref 8.9–10.3)
Chloride: 109 mmol/L (ref 98–111)
Creatinine, Ser: 2.9 mg/dL — ABNORMAL HIGH (ref 0.44–1.00)
GFR calc Af Amer: 19 mL/min — ABNORMAL LOW (ref >60)
GFR calc non Af Amer: 16 mL/min — ABNORMAL LOW (ref >60)
Glucose, Bld: 182 mg/dL — ABNORMAL HIGH (ref 70–99)
Potassium: 3.8 mmol/L (ref 3.5–5.1)
Sodium: 142 mmol/L (ref 135–145)
Total Bilirubin: 0.6 mg/dL (ref 0.3–1.2)
Total Protein: 6.2 g/dL — ABNORMAL LOW (ref 6.5–8.1)

## 2019-03-08 LAB — C-REACTIVE PROTEIN: CRP: <0.8 mg/dL (ref <1.0)

## 2019-03-08 LAB — GLUCOSE, CAPILLARY
Glucose-Capillary: 162 mg/dL — ABNORMAL HIGH (ref 70–99)
Glucose-Capillary: 172 mg/dL — ABNORMAL HIGH (ref 70–99)
Glucose-Capillary: 199 mg/dL — ABNORMAL HIGH (ref 70–99)
Glucose-Capillary: 205 mg/dL — ABNORMAL HIGH (ref 70–99)
Glucose-Capillary: 210 mg/dL — ABNORMAL HIGH (ref 70–99)
Glucose-Capillary: 211 mg/dL — ABNORMAL HIGH (ref 70–99)

## 2019-03-08 LAB — D-DIMER, QUANTITATIVE: D-Dimer, Quant: 8.04 ug/mL-FEU — ABNORMAL HIGH (ref 0.00–0.50)

## 2019-03-08 LAB — FERRITIN: Ferritin: 154 ng/mL (ref 11–307)

## 2019-03-08 MED ORDER — ORAL CARE MOUTH RINSE
15.0000 mL | Freq: Two times a day (BID) | OROMUCOSAL | Status: DC
  Administered 2019-03-08 – 2019-03-11 (×7): 15 mL via OROMUCOSAL

## 2019-03-08 MED ORDER — IPRATROPIUM-ALBUTEROL 20-100 MCG/ACT IN AERS
2.0000 | INHALATION_SPRAY | Freq: Four times a day (QID) | RESPIRATORY_TRACT | Status: DC | PRN
  Filled 2019-03-08: qty 4

## 2019-03-08 MED ORDER — LIP MEDEX EX OINT
TOPICAL_OINTMENT | CUTANEOUS | Status: DC | PRN
  Administered 2019-03-08: 1 via TOPICAL
  Filled 2019-03-08: qty 7

## 2019-03-08 MED ORDER — LEVALBUTEROL TARTRATE 45 MCG/ACT IN AERO
2.0000 | INHALATION_SPRAY | Freq: Four times a day (QID) | RESPIRATORY_TRACT | Status: DC
  Administered 2019-03-09 – 2019-03-10 (×8): 2 via RESPIRATORY_TRACT
  Filled 2019-03-08: qty 15

## 2019-03-08 MED ORDER — GUAIFENESIN-DM 100-10 MG/5ML PO SYRP
5.0000 mL | ORAL_SOLUTION | ORAL | Status: DC | PRN
  Administered 2019-03-08: 22:00:00 5 mL via ORAL
  Filled 2019-03-08: qty 5

## 2019-03-08 MED ORDER — MORPHINE SULFATE (PF) 2 MG/ML IV SOLN
1.0000 mg | INTRAVENOUS | Status: DC | PRN
  Administered 2019-03-09 – 2019-03-10 (×4): 2 mg via INTRAVENOUS
  Filled 2019-03-08 (×4): qty 1

## 2019-03-08 MED ORDER — JEVITY 1.2 CAL PO LIQD
1000.0000 mL | ORAL | Status: DC
  Administered 2019-03-08: 13:00:00 948 mL
  Administered 2019-03-09 – 2019-03-11 (×3): 1000 mL
  Filled 2019-03-08 (×6): qty 1000

## 2019-03-08 MED ORDER — PRO-STAT SUGAR FREE PO LIQD
30.0000 mL | Freq: Two times a day (BID) | ORAL | Status: DC
  Administered 2019-03-08 – 2019-03-11 (×8): 30 mL via ORAL
  Filled 2019-03-08 (×9): qty 30

## 2019-03-08 MED ORDER — CHLORHEXIDINE GLUCONATE 0.12 % MT SOLN
15.0000 mL | Freq: Two times a day (BID) | OROMUCOSAL | Status: DC
  Administered 2019-03-08 – 2019-03-11 (×6): 15 mL via OROMUCOSAL
  Filled 2019-03-08 (×6): qty 15

## 2019-03-08 NOTE — Progress Notes (Addendum)
PROGRESS NOTE  Nichole Franklin KNL:[id number] DOB: 11-03-1950 DOA: 02/14/2019  PCP: Elwyn Reach, MD  Brief History/Interval Summary: Patient is a 68 y.o. female with PMHx of DM-2, HTN, dyslipidemia, CKD stage IV who presented to the hospital initially for evaluation of weakness, she was found to have fever and acute kidney injury.  She was subsequently admitted to [facility] her renal function stabilized-she was transferred to [facility] course since admission has been complicated by encephalopathy, angioedema following initiation of Remdesivir-and respiratory failure requiring intubation on 7/4.  Reason for Visit: Acute respiratory disease due to COVID-19  Consultants: Pulmonology  Procedures:  Intubation 7/4  Central line left IJ 7/4  Antibiotics: Anti-infectives (From admission, onward)   Start     Dose/Rate Route Frequency Ordered Stop   03/07/19 1000  ceFEPIme (MAXIPIME) 2 g in sodium chloride 0.9 % 100 mL IVPB     2 g 200 mL/hr over 30 Minutes Intravenous Every 24 hours 03/06/19 1434     03/04/19 2200  ceFEPIme (MAXIPIME) 1 g in sodium chloride 0.9 % 100 mL IVPB  Status:  Discontinued     1 g 200 mL/hr over 30 Minutes Intravenous Every 12 hours 03/04/19 1256 03/06/19 1434   03/04/19 1400  remdesivir 100 mg in sodium chloride 0.9 % 250 mL IVPB  Status:  Discontinued     100 mg 500 mL/hr over 30 Minutes Intravenous Every 24 hours 03/03/19 1305 03/04/19 0936   03/04/19 1200  linezolid (ZYVOX) IVPB 600 mg  Status:  Discontinued     600 mg 300 mL/hr over 60 Minutes Intravenous Every 12 hours 03/04/19 1141 03/05/19 1100   03/04/19 0900  linezolid (ZYVOX) IVPB 600 mg  Status:  Discontinued     600 mg 300 mL/hr over 60 Minutes Intravenous Every 12 hours 03/04/19 0759 03/04/19 1141   03/04/19 0800  ceFEPIme (MAXIPIME) 2 g in sodium chloride 0.9 % 100 mL IVPB  Status:  Discontinued     2 g 200 mL/hr over 30 Minutes Intravenous Every 12 hours  03/04/19 0706 03/04/19 1256   03/03/19 1400  remdesivir 200 mg in sodium chloride 0.9 % 250 mL IVPB     200 mg 500 mL/hr over 30 Minutes Intravenous Once 03/03/19 1305 03/03/19 1500   02/27/19 2000  azithromycin (ZITHROMAX) 500 mg in sodium chloride 0.9 % 250 mL IVPB  Status:  Discontinued     500 mg 250 mL/hr over 60 Minutes Intravenous Every 24 hours 02/27/19 0428 03/04/19 1211   02/27/19 0430  cefTRIAXone (ROCEPHIN) 1 g in sodium chloride 0.9 % 100 mL IVPB  Status:  Discontinued     1 g 200 mL/hr over 30 Minutes Intravenous Every 24 hours 02/27/19 0428 03/04/19 0706       Subjective/Interval History: Patient intubated.  But seems to be off of sedation this morning.  She is awake.  Follows certain commands.    Assessment/Plan:  Acute Hypoxic Resp. Failure due to Acute Covid 19 Viral Illness  Vent Mode: PSV;CPAP FiO2 (%):  [30 %-80 %] 80 % Set Rate:  [24 bmp] 24 bmp Vt Set:  [470 mL] 470 mL PEEP:  [5 cmH20] 5 cmH20 Pressure Support:  [12 cmH20] 12 cmH20 Plateau Pressure:  [17 cmH20-26 cmH20] 17 cmH20     Component Value Date/Time   PHART 7.361 03/05/2019 0836   PCO2ART 40.7 03/05/2019 0836   PO2ART 87.0 03/05/2019 0836   HCO3 23.0 03/05/2019 0836   TCO2 24 03/05/2019  0836   ACIDBASEDEF 2.0 03/05/2019 0836   O2SAT 96.0 03/05/2019 0836    COVID-19 Labs  Recent Labs    03/06/19 0430 03/07/19 0425 03/08/19 0400  DDIMER  --   --  8.04*  FERRITIN 145 212 154  LDH 537* 644*  --   CRP 1.1* 1.5* <0.8    Lab Results  Component Value Date   SARSCOV2NAA POSITIVE (A) 01/31/2019     Fever: Has been afebrile the last 3 days. Oxygen requirements: On mechanical ventilation.  30% FiO2.  Saturating in the 90s. Antibiotics: On cefepime Remdesivir: Patient developed angioedema to Remdesivir Steroids: On Solu-Medrol Diuretics: Not being given on a scheduled basis.  The patient was given a dose of IV Lasix yesterday. Actemra: Did not receive Convalescent Plasma: Did not  receive Vitamin C and Zinc: Tenia DVT Prophylaxis: On heparin 7500 units subcutaneously every 8 hours  Patient had to be intubated due to angioedema thought to be secondary to Remdesivir.  Angioedema resolved.  Cuff leak was noted.  Patient has been doing well with spontaneous breathing trial.  There was concern for superimposed bacterial infection and the patient remains on cefepime.  Patient is on IV steroids.  Inflammatory markers have improved.  CRP is improved to 0.8.  Ferritin 154.  D-dimer remains elevated at 8.  Plan is to try and extubate the patient today.   Acute metabolic encephalopathy Most likely due to hypoxemia and other acute metabolic derangements.  CT scan done on 7/4 did not show any acute findings.  Mentation appears to have improved.  Angioedema Most likely secondary to Remdesivir.  Tongue swelling has improved.  Acute kidney injury on chronic kidney disease stage IV Baseline creatinine around 1.6-2.  Presented with a BUN of 81 and a creatinine of 7.09.  Renal function has improved  BUN remains elevated at 97.  Creatinine down to 2.9.  Patient has good urine output.  Hyponatremia Resolved with free water.  Monitor electrolytes closely.  Diabetes mellitus type 2 with uncontrolled hyperglycemia No longer on insulin infusion.  Patient is on Lantus and SSI.  Continue to monitor.  HbA1c 9.3.  Essential hypertension Blood pressure reasonably well controlled.  Continue amlodipine and labetalol.  Leukocytosis Patient with significantly elevated WBC.  Could be due to Solu-Medrol.  No new infectious process identified.  Continue to monitor.  Normocytic anemia  Hemoglobin noted to be 7.9 today.  Mild drop in hemoglobin noted.  No evidence for overt bleeding.  Patient was transfused 1 unit of PRBC on 7/6.  Continue to monitor.  Transfuse if it drops below 7.  Elevated d-dimer Patient was empirically started on IV heparin on 7/4 due to concern for VTE.  Due to drop in  hemoglobin this was discontinued.  Lower extremity Doppler study was negative for DVT.  Low suspicion for VTE at this time.  Elevated d-dimer most likely due to COVID-19.  Continue high dose subcutaneous heparin for now.  Nutrition Cortrak Feeding tube to be placed today.  Tube feedings.   DVT Prophylaxis: Subcutaneous heparin PUD Prophylaxis: Protonix Code Status: Full code Family Communication: Will discuss with family. Disposition Plan: Management as outlined above.  Possible extubation today.   Medications:  Scheduled: . sodium chloride   Intravenous Once  . amLODipine  10 mg Per Tube Daily  . Chlorhexidine Gluconate Cloth  6 each Topical Q0600  . cholecalciferol  1,000 Units Per Tube Daily  . ferrous sulfate  300 mg Per Tube BID WC  . free water  300 mL Per Tube Q4H  . heparin injection (subcutaneous)  7,500 Units Subcutaneous Q8H  . insulin aspart  0-20 Units Subcutaneous Q4H  . insulin glargine  25 Units Subcutaneous BID  . ipratropium-albuterol  3 mL Nebulization Q6H  . labetalol  10 mg Intravenous Q8H  . pantoprazole (PROTONIX) IV  40 mg Intravenous Q12H  . vitamin C  1,000 mg Per Tube Daily  . zinc sulfate  220 mg Per Tube Daily   Continuous: . sodium chloride 10 mL/hr at 03/08/19 0900  . ceFEPime (MAXIPIME) IV 2 g (03/08/19 1054)  . dexmedetomidine (PRECEDEX) IV infusion 1.2 mcg/kg/hr (03/08/19 1029)  . famotidine (PEPCID) IV 20 mg (03/08/19 1014)  . feeding supplement (VITAL AF 1.2 CAL) 50 mL/hr at 03/08/19 0600   EPP:IRJJOACZYSAYT **OR** acetaminophen, dextrose, EPINEPHrine, fentaNYL (SUBLIMAZE) injection, hydrALAZINE, lip balm, midazolam, ondansetron **OR** ondansetron (ZOFRAN) IV   Objective:  Vital Signs  Vitals:   03/08/19 0850 03/08/19 0900 03/08/19 0904 03/08/19 1046  BP:   (!) 158/74   Pulse:  95 96 93  Resp:  (!) 38 (!) 36 (!) 32  Temp:      TempSrc:      SpO2: 97% 98% 96% 100%  Weight:      Height:        Intake/Output Summary (Last 24  hours) at 03/08/2019 1113 Last data filed at 03/08/2019 0900 Gross per 24 hour  Intake 2446.8 ml  Output 3251 ml  Net -804.2 ml   Filed Weights   03/06/19 0500 03/07/19 0500 03/08/19 0448  Weight: 78.8 kg 80.2 kg 80.2 kg    General appearance: Noted to be awake.  Follows certain commands.  Is intubated. Resp: Noted to be tachypneic.  Coarse breath sounds bilaterally.  Few crackles at the bases.  No wheezing or rhonchi. Cardio: S1-S2 is normal regular.  No S3-S4.  No rubs murmurs or bruit GI: Abdomen is soft.  Nontender nondistended.  Bowel sounds are present normal.  No masses organomegaly Extremities: No edema.  Moving her lower extremities Neurologic: Moving her lower extremities and upper extremities.  No obvious focal neurological deficit   Lab Results:  Data Reviewed: I have personally reviewed following labs and imaging studies  CBC: Recent Labs  Lab 03/02/19 0300  03/04/19 0131  03/05/19 0408  03/05/19 0836 03/06/19 0430 03/06/19 1750 03/07/19 0425 03/08/19 0400  WBC 18.7*   < > 23.2*  --  21.1*  --   --  33.0* 37.0* 38.4* 35.7*  NEUTROABS 14.5*  --  17.7*  --   --   --   --   --   --   --   --   HGB 8.1*   < > 8.1*   < > 6.3*   < > 7.1* 6.7* 8.5* 8.8* 7.9*  HCT 25.3*   < > 26.2*   < > 21.8*   < > 21.0* 23.3* 27.4* 29.1* 25.4*  MCV 84.9   < > 87.0  --  91.6  --   --  92.1 91.9 90.1 88.8  PLT 256   < > 256  --  198  --   --  192 188 210 195   < > = values in this interval not displayed.    Basic Metabolic Panel: Recent Labs  Lab 03/03/19 0330  03/04/19 0131  03/04/19 1558 03/05/19 0408 03/05/19 0836 03/05/19 1700 03/06/19 0430 03/07/19 0425 03/08/19 0400  NA  --    < > 149*   < >  --  151* 151*  --  146* 143 142  K  --    < > 3.5   < >  --  4.7 4.9  --  4.3 4.0 3.8  CL  --    < > 107  --   --  117*  --   --  113* 109 109  CO2  --    < > 25  --   --  23  --   --  24 24 23   GLUCOSE  --    < > 230*  --   --  220*  --   --  260* 267* 182*  BUN  --    < > 79*   --   --  92*  --   --  100* 91* 97*  CREATININE  --    < > 3.23*  --   --  3.26*  --   --  3.21* 2.94* 2.90*  CALCIUM  --    < > 7.6*  --   --  7.3*  --   --  7.7* 8.2* 8.3*  MG 2.0  --  2.1  --  1.9 1.9  --  2.6*  --   --   --   PHOS  --   --   --   --  4.3 3.5  --  3.7  --   --   --    < > = values in this interval not displayed.    GFR: Estimated Creatinine Clearance: 19.8 mL/min (A) (by C-G formula based on SCr of 2.9 mg/dL (H)).  Liver Function Tests: Recent Labs  Lab 03/05/19 0408 03/05/19 0700 03/06/19 0430 03/07/19 0425 03/08/19 0400  AST 65* 51* 44* 118* 35  ALT 53* 52* 44 80* 65*  ALKPHOS 161* 174* 154* 226* 192*  BILITOT 0.4 0.6 0.3 0.4 0.6  PROT 5.4* 6.1* 5.9* 6.8 6.2*  ALBUMIN 2.4* 2.6* 2.5* 2.8* 2.6*    Cardiac Enzymes: Recent Labs  Lab 03/04/19 1558  CKTOTAL 374*     CBG: Recent Labs  Lab 03/07/19 1514 03/07/19 1943 03/07/19 2307 03/08/19 0431 03/08/19 0735  GLUCAP 282* 233* 248* 172* 211*     Anemia Panel: Recent Labs    03/07/19 0425 03/08/19 0400  FERRITIN 212 154    Recent Results (from the past 240 hour(s))  SARS Coronavirus 2 (CEPHEID- Performed in [place] hospital lab), Hosp Order     Status: Abnormal   Collection Time: 02/01/2019 12:47 PM   Specimen: Nasopharyngeal Swab  Result Value Ref Range Status   SARS Coronavirus 2 POSITIVE (A) NEGATIVE Final    Comment: RESULT CALLED TO, READ BACK BY AND VERIFIED WITH: KIM MAIN @1410  ON 02/11/2019 BY FMW (NOTE) If result is NEGATIVE SARS-CoV-2 target nucleic acids are NOT DETECTED. The SARS-CoV-2 RNA is generally detectable in upper and lower  respiratory specimens during the acute phase of infection. The lowest  concentration of SARS-CoV-2 viral copies this assay can detect is 250  copies / mL. A negative result does not preclude SARS-CoV-2 infection  and should not be used as the sole basis for treatment or other  patient management decisions.  A negative result may occur with   improper specimen collection / handling, submission of specimen other  than nasopharyngeal swab, presence of viral mutation(s) within the  areas targeted by this assay, and inadequate number of viral copies  (<250 copies / mL). A negative result must be combined with clinical  observations,  patient history, and epidemiological information. If result is POSITIVE SARS-CoV-2 target nucleic acids are DETECTED. T he SARS-CoV-2 RNA is generally detectable in upper and lower  respiratory specimens during the acute phase of infection.  Positive  results are indicative of active infection with SARS-CoV-2.  Clinical  correlation with patient history and other diagnostic information is  necessary to determine patient infection status.  Positive results do  not rule out bacterial infection or co-infection with other viruses. If result is PRESUMPTIVE POSTIVE SARS-CoV-2 nucleic acids MAY BE PRESENT.   A presumptive positive result was obtained on the submitted specimen  and confirmed on repeat testing.  While 2019 novel coronavirus  (SARS-CoV-2) nucleic acids may be present in the submitted sample  additional confirmatory testing may be necessary for epidemiological  and / or clinical management purposes  to differentiate between  SARS-CoV-2 and other Sarbecovirus currently known to infect humans.  If clinically indicated additional testing with an alternate test  methodology [phone]) is  advised. The SARS-CoV-2 RNA is generally  detectable in upper and lower respiratory specimens during the acute  phase of infection. The expected result is Negative. Fact Sheet for Patients:  StrictlyIdeas.no Fact Sheet for Healthcare Providers: BankingDealers.co.za This test is not yet approved or cleared by the [place] FDA and has been authorized for detection and/or diagnosis of SARS-CoV-2 by FDA under an Emergency Use Authorization (EUA).  This EUA will  remain in effect (meaning this test can be used) for the duration of the COVID-19 declaration under Section 564(b)(1) of the Act, 21 U.S.C. section 360bbb-3(b)(1), unless the authorization is terminated or revoked sooner. Performed at [facility], [address] [place], [place] [address]   Culture, blood (Routine X 2) w Reflex to ID Panel     Status: None   Collection Time: 02/08/2019  3:54 PM   Specimen: BLOOD  Result Value Ref Range Status   Specimen Description BLOOD BLOOD LEFT FOREARM  Final   Special Requests   Final    BOTTLES DRAWN AEROBIC AND ANAEROBIC Blood Culture results may not be optimal due to an excessive volume of blood received in culture bottles   Culture   Final    NO GROWTH 5 DAYS Performed at [facility], [address] [place], [place] [address]    Report Status 03/03/2019 FINAL  Final  Culture, blood (Routine X 2) w Reflex to ID Panel     Status: None   Collection Time: 02/28/2019  3:54 PM   Specimen: BLOOD  Result Value Ref Range Status   Specimen Description BLOOD LEFT ANTECUBITAL  Final   Special Requests   Final    BOTTLES DRAWN AEROBIC AND ANAEROBIC Blood Culture results may not be optimal due to an excessive volume of blood received in culture bottles   Culture   Final    NO GROWTH 5 DAYS Performed at [facility], [address] [place], [place] [address]    Report Status 03/03/2019 FINAL  Final  Culture, respiratory (non-expectorated)     Status: None   Collection Time: 03/04/19 11:41 AM   Specimen: Tracheal Aspirate; Respiratory  Result Value Ref Range Status   Specimen Description   Final    TRACHEAL ASPIRATE Performed at [facility] [address], [place], [place] [address]    Special Requests   Final    NONE Performed at [facility], [place] [address], [place], [place] [address]    Gram Stain  Final    RARE WBC PRESENT, PREDOMINANTLY PMN MODERATE SQUAMOUS EPITHELIAL  CELLS PRESENT ABUNDANT GRAM POSITIVE COCCI    Culture   Final    MODERATE Consistent with normal respiratory flora. Performed at [facility], [place] [address], [place], [place] [address]    Report Status 03/07/2019 FINAL  Final  MRSA PCR Screening     Status: None   Collection Time: 03/04/19  7:08 PM   Specimen: Nasal Mucosa; Nasopharyngeal  Result Value Ref Range Status   MRSA by PCR NEGATIVE NEGATIVE Final    Comment:        The GeneXpert MRSA Assay (FDA approved for NASAL specimens only), is one component of a comprehensive MRSA colonization surveillance program. It is not intended to diagnose MRSA infection nor to guide or monitor treatment for MRSA infections. Performed at [facility], [place] [address], [place], [place] [address]       Radiology Studies: Dg Chest Port 1 View  Result Date: 03/08/2019 CLINICAL DATA:  COVID-19 pneumonia.  Endotracheal tube present. EXAM: PORTABLE CHEST 1 VIEW COMPARISON:  03/07/2019 and 03/04/2019 FINDINGS: Endotracheal tube tip is 5 cm above the carina. NG tube tip is in the fundus of the stomach. Central venous catheter tip is in the superior vena cava at the level of the azygos vein, unchanged. Faint peripheral infiltrates in the left lung appears slightly improved. Right lung is clear. No effusion. Heart size and vascularity are normal. IMPRESSION: Slight improvement in the peripheral infiltrates in the left lung. Electronically Signed   By: Lorriane Shire M.D.   On: 03/08/2019 07:04   Dg Chest Port 1 View  Result Date: 03/07/2019 CLINICAL DATA:  Shortness of breath. EXAM: PORTABLE CHEST 1 VIEW COMPARISON:  03/04/2019. FINDINGS: Endotracheal tube, left IJ line, NG tube in stable position. Heart size stable. Unchanged bilateral interstitial prominence. Improved aeration in both lung bases. No pleural effusion or pneumothorax. IMPRESSION: 1.  Lines and tubes in stable position. 2. Persistent bilateral interstitial  prominence unchanged. Improved aeration both lung bases. Electronically Signed   By: Marcello Moores  Register   On: 03/07/2019 06:43   Vas [place] Lower Extremity Venous (dvt)  Result Date: 03/06/2019  Lower Venous Study Indications: Swelling, Edema, and Fever and Positive Covid 19.  Comparison Study: No prior. Performing Technologist: Oda Cogan RDMS, RVT  Examination Guidelines: A complete evaluation includes B-mode imaging, spectral Doppler, color Doppler, and power Doppler as needed of all accessible portions of each vessel. Bilateral testing is considered an integral part of a complete examination. Limited examinations for reoccurring indications may be performed as noted.  +---------+---------------+---------+-----------+----------+-------+ RIGHT    CompressibilityPhasicitySpontaneityPropertiesSummary +---------+---------------+---------+-----------+----------+-------+ CFV      Full           Yes      Yes                          +---------+---------------+---------+-----------+----------+-------+ SFJ      Full                                                 +---------+---------------+---------+-----------+----------+-------+ FV Prox  Full                                                 +---------+---------------+---------+-----------+----------+-------+  FV Mid   Full                                                 +---------+---------------+---------+-----------+----------+-------+ FV DistalFull                                                 +---------+---------------+---------+-----------+----------+-------+ PFV      Full                                                 +---------+---------------+---------+-----------+----------+-------+ POP      Full           Yes      Yes                          +---------+---------------+---------+-----------+----------+-------+ PTV      Full                                                  +---------+---------------+---------+-----------+----------+-------+ PERO     Full                                                 +---------+---------------+---------+-----------+----------+-------+   +---------+---------------+---------+-----------+----------+-------+ LEFT     CompressibilityPhasicitySpontaneityPropertiesSummary +---------+---------------+---------+-----------+----------+-------+ CFV      Full           Yes      Yes                          +---------+---------------+---------+-----------+----------+-------+ SFJ      Full                                                 +---------+---------------+---------+-----------+----------+-------+ FV Prox  Full                                                 +---------+---------------+---------+-----------+----------+-------+ FV Mid   Full                                                 +---------+---------------+---------+-----------+----------+-------+ FV DistalFull                                                 +---------+---------------+---------+-----------+----------+-------+ PFV  Full                                                 +---------+---------------+---------+-----------+----------+-------+ POP      Full           Yes      Yes                          +---------+---------------+---------+-----------+----------+-------+ PTV      Full                                                 +---------+---------------+---------+-----------+----------+-------+ PERO     Full                                                 +---------+---------------+---------+-----------+----------+-------+     Summary: Right: There is no evidence of deep vein thrombosis in the lower extremity. Left: There is no evidence of deep vein thrombosis in the lower extremity.  *See table(s) above for measurements and observations. Electronically signed by Ruta Hinds MD on 03/06/2019 at 8:38:47 PM.     Final        LOS: 10 days   [place] [facility] Pager on [email or web address]  03/08/2019, 11:13 AM

## 2019-03-08 NOTE — Procedures (Signed)
Cortrak  Person Inserting Tube:  Maylon Peppers C, RD Tube Type:  Cortrak - 43 inches Tube Location:  Left nare Initial Placement:  Stomach Secured by: Bridle Technique Used to Measure Tube Placement:  Documented cm marking at nare/ corner of mouth Cortrak Secured At:  70 cm    Cortrak Tube Team Note:  Consult received to place a Cortrak feeding tube. See placement details above.   No x-ray is required. RN may begin using tube.   If the tube becomes dislodged please keep the tube and contact the Cortrak team at [email or web address] (password [email or web address]) for replacement.  If after hours and replacement cannot be delayed, place a NG tube and confirm placement with an abdominal x-ray.    [place], [place], [place] Pager [phone] After Hours Pager

## 2019-03-08 NOTE — Progress Notes (Signed)
[name] (daughter) called for nightly update. All questions answered. Appreciative of the update. Will continue to closely monitor.

## 2019-03-08 NOTE — Progress Notes (Signed)
RN called and spoke to patient's daughter [name] on phone to update her, informed her that as of right now her mother was extubated and doing well. Questions answered, she was very appreciative.She will call and check back before she goes into work later.

## 2019-03-08 NOTE — Procedures (Signed)
Extubation Procedure Note  Patient Details:   Name: Nichole Franklin DOB: [birth year] MRN: [id number]   Airway Documentation:    Vent end date: 03/08/19 Vent end time: 1048   Evaluation  O2 sats: stable throughout Complications: No apparent complications Patient did tolerate procedure well. Bilateral Breath Sounds: Diminished   Yes   Patient extubated to 40l HHFNC with FiO2 80%.  Patient with cuff leak prior to extubation.  After extubation, patient able to speak.  No stridor noted;  Will continue to monitor.  Phillis Knack [facility] 03/08/2019, 10:48 AM

## 2019-03-08 NOTE — Progress Notes (Signed)
NAME:  Nichole Franklin, MRN:  [id number], DOB:  08-14-1951, LOS: 1 ADMISSION DATE:  02/12/2019, CONSULTATION DATE:  7/4 REFERRING MD:  Aileen Fass, CHIEF COMPLAINT:  Found confused, tongue swelling   Brief History   68 y/o female with extensive PMH admitted June 28 in the setting of a new COVID 19 diagnosis.  Admitted to [facility] due to renal failure which improved with IV fluids.  Transferred to [id number].  On hospital day 6 she developed worsening confusion and tongue swelling felt to be a reaction to remdesivir; then was transferred to the ICU emergently for intubation.   Past Medical History  Hypertension Hyperlipidemia Diabetes mellitus Gastroesophageal reflux disease History of cervical cancer Anxiety  Significant Hospital Events   June 28 admission [facility] from [facility] for AKI, poor po intake June 30 transfer to [facility], confusion July 2: worsening hypoxemia July 3 remdesivir, worsening confusion July 4 tongue swelling, hypertension, severe encephalopathy, transfer to ICU, intubation, new worsening fever - Admitted to the ICU for worsening confusion, required emergent intubation Tongue swelling noted July 5 > July 7 tongue swelling removed, awake, not extubated  Consults:  Nephrology, signed off PCCM  Procedures:  July 4 left IJ CVL July 4 endotracheal tube July 8 Cor-Trak gastric tube  Significant Diagnostic Tests:  June 28 CT scan abdomen pelvis for nephrolithiasis: No explanation of renal failure, no urinary calculus, extensive multifocal groundglass opacity in lungs, sclerotic inferior endplate deformities of T11 and T12 July 4 CT Head > pending  Micro Data:  7/4 resp culture > OPF  Antimicrobials:  6/28 ceftriaxone/azithro > July 3 7/3 cefepime >  7/4 Linezolid > 7/5 7/4 cefepime >    Interim history/subjective:  On SBT this morning, tachypnea but not hypoxemic   Objective   Blood pressure (!) 148/66, pulse 76, temperature 98.6 F (37 C),  temperature source Axillary, resp. rate (!) 24, height 5\' 6"  (1.676 m), weight 80.2 kg, SpO2 100 %.    Vent Mode: PRVC FiO2 (%):  [30 %] 30 % Set Rate:  [24 bmp] 24 bmp Vt Set:  [470 mL] 470 mL PEEP:  [5 cmH20] 5 cmH20 Plateau Pressure:  [17 cmH20-26 cmH20] 17 cmH20   Intake/Output Summary (Last 24 hours) at 03/08/2019 4128 Last data filed at 03/08/2019 0600 Gross per 24 hour  Intake 2830.96 ml  Output 3151 ml  Net -320.04 ml   Filed Weights   03/06/19 0500 03/07/19 0500 03/08/19 0448  Weight: 78.8 kg 80.2 kg 80.2 kg    Examination:  General:  In bed on vent HENT: NCAT ETT in place PULM: CTA B, vent supported breathing CV: RRR, no mgr GI: BS+, soft, nontender MSK: normal bulk and tone Neuro: awake, will follow commands   Resolved Hospital Problem list     Assessment & Plan:  Acute encephalopathy> improving minimize sedating medicines Frequent orientation  COVID 19 pneumonia> improved, CXR normal, oxygenation OK Hold remdesivir given allergy  Angioedema due to remdesivir, requirement for intubation> resolved tachypnea this morning, suspect anxiety driven Extubate after cor-trak placement Hold tube feeding for now Stop sedation protocol  Fever 7/4> due to COVID Monitor for signs of sepsis  Elevated BUN CKD Stop methylprednisolone (has received many days of this) Monitor BMET and UOP Replace electrolytes as needed   Best practice:  Diet: start tube feeding Pain/Anxiety/Delirium protocol (if indicated): d/c sedation protocol VAP protocol (if indicated): n/a after extubation DVT prophylaxis: sub q heparin GI prophylaxis: famotidine Glucose control: SSI Mobility: bed rest Code  Status: full Family Communication: none bedside Disposition: remain in ICU  Labs   CBC: Recent Labs  Lab 03/02/19 0300  03/04/19 0131  03/05/19 0408  03/05/19 0836 03/06/19 0430 03/06/19 1750 03/07/19 0425 03/08/19 0400  WBC 18.7*   < > 23.2*  --  21.1*  --   --  33.0*  37.0* 38.4* 35.7*  NEUTROABS 14.5*  --  17.7*  --   --   --   --   --   --   --   --   HGB 8.1*   < > 8.1*   < > 6.3*   < > 7.1* 6.7* 8.5* 8.8* 7.9*  HCT 25.3*   < > 26.2*   < > 21.8*   < > 21.0* 23.3* 27.4* 29.1* 25.4*  MCV 84.9   < > 87.0  --  91.6  --   --  92.1 91.9 90.1 88.8  PLT 256   < > 256  --  198  --   --  192 188 210 195   < > = values in this interval not displayed.    Basic Metabolic Panel: Recent Labs  Lab 03/03/19 0330  03/04/19 0131  03/04/19 1558 03/05/19 0408 03/05/19 0836 03/05/19 1700 03/06/19 0430 03/07/19 0425 03/08/19 0400  NA  --    < > 149*   < >  --  151* 151*  --  146* 143 142  K  --    < > 3.5   < >  --  4.7 4.9  --  4.3 4.0 3.8  CL  --    < > 107  --   --  117*  --   --  113* 109 109  CO2  --    < > 25  --   --  23  --   --  24 24 23   GLUCOSE  --    < > 230*  --   --  220*  --   --  260* 267* 182*  BUN  --    < > 79*  --   --  92*  --   --  100* 91* 97*  CREATININE  --    < > 3.23*  --   --  3.26*  --   --  3.21* 2.94* 2.90*  CALCIUM  --    < > 7.6*  --   --  7.3*  --   --  7.7* 8.2* 8.3*  MG 2.0  --  2.1  --  1.9 1.9  --  2.6*  --   --   --   PHOS  --   --   --   --  4.3 3.5  --  3.7  --   --   --    < > = values in this interval not displayed.   GFR: Estimated Creatinine Clearance: 19.8 mL/min (A) (by C-G formula based on SCr of 2.9 mg/dL (H)). Recent Labs  Lab 03/05/19 0408 03/06/19 0430 03/06/19 1750 03/07/19 0425 03/08/19 0400  PROCALCITON 0.94  --   --   --   --   WBC 21.1* 33.0* 37.0* 38.4* 35.7*    Liver Function Tests: Recent Labs  Lab 03/05/19 0408 03/05/19 0700 03/06/19 0430 03/07/19 0425 03/08/19 0400  AST 65* 51* 44* 118* 35  ALT 53* 52* 44 80* 65*  ALKPHOS 161* 174* 154* 226* 192*  BILITOT 0.4 0.6 0.3 0.4 0.6  PROT 5.4* 6.1* 5.9*  6.8 6.2*  ALBUMIN 2.4* 2.6* 2.5* 2.8* 2.6*   No results for input(s): LIPASE, AMYLASE in the last 168 hours. No results for input(s): AMMONIA in the last 168 hours.  ABG     Component Value Date/Time   PHART 7.361 03/05/2019 0836   PCO2ART 40.7 03/05/2019 0836   PO2ART 87.0 03/05/2019 0836   HCO3 23.0 03/05/2019 0836   TCO2 24 03/05/2019 0836   ACIDBASEDEF 2.0 03/05/2019 0836   O2SAT 96.0 03/05/2019 0836     Coagulation Profile: No results for input(s): INR, PROTIME in the last 168 hours.  Cardiac Enzymes: Recent Labs  Lab 03/04/19 1558  CKTOTAL 374*    HbA1C: Hgb A1c MFr Bld  Date/Time Value Ref Range Status  02/27/2019 02:15 AM 9.3 (H) 4.8 - 5.6 % Final    Comment:    (NOTE) Pre diabetes:          5.7%-6.4% Diabetes:              >6.4% Glycemic control for   <7.0% adults with diabetes   08/05/2010 05:10 AM (H) <5.7 % Final   8.0 (NOTE)                                                                       According to the ADA Clinical Practice Recommendations for 2011, when HbA1c is used as a screening test:   >=6.5%   Diagnostic of Diabetes Mellitus           (if abnormal result  is confirmed)  5.7-6.4%   Increased risk of developing Diabetes Mellitus  References:Diagnosis and Classification of Diabetes Mellitus,Diabetes CVKF,8403,75(OHKGO 1):S62-S69 and Standards of Medical Care in         Diabetes - 2011,Diabetes Care,2011,34  (Suppl 1):S11-S61.    CBG: Recent Labs  Lab 03/07/19 1514 03/07/19 1943 03/07/19 2307 03/08/19 0431 03/08/19 0735  GLUCAP 282* 233* 248* 172* 211*     Critical care time: 35 minutes    Roselie Awkward, MD [place] [facility] Pager: [phone] Cell: [phone] If no response, call [phone]

## 2019-03-08 NOTE — Progress Notes (Signed)
Nutrition Follow-up RD working remotely.  DOCUMENTATION CODES:   Not applicable  INTERVENTION:   To better meet re-estimated needs, change TF:   Jevity 1.2 at 55 ml/h (1320 ml per day)   Pro-stat 30 ml BID   Provides 1784 kcal, 103 gm protein, 1069 ml free water daily  NUTRITION DIAGNOSIS:   Inadequate oral intake related to inability to eat as evidenced by NPO status.  Ongoing  GOAL:   Patient will meet greater than or equal to 90% of their needs  Met with TF  MONITOR:   TF tolerance, Diet advancement, PO intake, Labs  ASSESSMENT:   68 year-old female with medical history of type 2 DM, HTN, dyslipidemia, and CKD stage 4 who presented to the hospital initially for evaluation of weakness. She was then found to have fever and AKI and subsequently admitted to [facility], where she was found to be COVID-19 positive. Once her renal function stabilized, she was transferred to [facility] course since admission has been complicated by encephalopathy, angioedema following initiation of Remdesivir-and respiratory failure requiring intubation on 7/4.  Patient was extubated this morning. Prior to extubation, Cortrak was placed for continuing enteral nutrition.  Currently receiving Vital AF 1.2 at 50 ml/h. Free water 300 ml every 4 hours.  Labs reviewed. BUN 97 (H), creatinine 2.9 (H) CBG's: 172-211-210  Medications reviewed and include vitamin D3, ferrous sulfate, novolog, lantus, vitamin C, zinc.   I/O net negative 1.9 L  Diet Order:   Diet Order    None      EDUCATION NEEDS:   Not appropriate for education at this time  Skin:  Skin Assessment: Reviewed RN Assessment  Last BM:  7/7 (type 4)  Height:   Ht Readings from Last 1 Encounters:  02/04/2019 '5\' 6"'$  (1.676 m)    Weight:   Wt Readings from Last 1 Encounters:  03/08/19 80.2 kg    Ideal Body Weight:  59.1 kg  BMI:  Body mass index is 28.54 kg/m.  Estimated  Nutritional Needs:   Kcal:  1610-9604  Protein:  100-125 gm  Fluid:  >/= 1.8 L/day    Molli Barrows, RD, LDN, [place] Pager [phone] After Hours Pager [phone]

## 2019-03-08 NOTE — Progress Notes (Signed)
[facility] PCCM  I called her daughter to update her re extubation.  Roselie Awkward, MD [place] P[facility]M Pager: [phone] Cell: [phone] If no response, call [phone]

## 2019-03-08 NOTE — Progress Notes (Signed)
[facility] PCCM  Seen on rounds On pressure support Following commands oxygeantion OK CXR OK Has cuff leak  Extubate now  Full note to follow  Roselie Awkward, MD [place] [facility]M Pager: [phone] Cell: [phone] If no response, call [phone]

## 2019-03-09 ENCOUNTER — Inpatient Hospital Stay (HOSPITAL_COMMUNITY): Payer: Medicare HMO

## 2019-03-09 LAB — GLUCOSE, CAPILLARY
Glucose-Capillary: 121 mg/dL — ABNORMAL HIGH (ref 70–99)
Glucose-Capillary: 163 mg/dL — ABNORMAL HIGH (ref 70–99)
Glucose-Capillary: 171 mg/dL — ABNORMAL HIGH (ref 70–99)
Glucose-Capillary: 196 mg/dL — ABNORMAL HIGH (ref 70–99)
Glucose-Capillary: 269 mg/dL — ABNORMAL HIGH (ref 70–99)
Glucose-Capillary: 333 mg/dL — ABNORMAL HIGH (ref 70–99)

## 2019-03-09 LAB — BASIC METABOLIC PANEL
Anion gap: 14 (ref 5–15)
BUN: 93 mg/dL — ABNORMAL HIGH (ref 8–23)
CO2: 22 mmol/L (ref 22–32)
Calcium: 8.6 mg/dL — ABNORMAL LOW (ref 8.9–10.3)
Chloride: 108 mmol/L (ref 98–111)
Creatinine, Ser: 2.99 mg/dL — ABNORMAL HIGH (ref 0.44–1.00)
GFR calc Af Amer: 18 mL/min — ABNORMAL LOW (ref >60)
GFR calc non Af Amer: 15 mL/min — ABNORMAL LOW (ref >60)
Glucose, Bld: 164 mg/dL — ABNORMAL HIGH (ref 70–99)
Potassium: 3.8 mmol/L (ref 3.5–5.1)
Sodium: 144 mmol/L (ref 135–145)

## 2019-03-09 LAB — CBC
HCT: 27.1 % — ABNORMAL LOW (ref 36.0–46.0)
Hemoglobin: 8.5 g/dL — ABNORMAL LOW (ref 12.0–15.0)
MCH: 27.9 pg (ref 26.0–34.0)
MCHC: 31.4 g/dL (ref 30.0–36.0)
MCV: 88.9 fL (ref 80.0–100.0)
Platelets: 195 10*3/uL (ref 150–400)
RBC: 3.05 MIL/uL — ABNORMAL LOW (ref 3.87–5.11)
RDW: 15.9 % — ABNORMAL HIGH (ref 11.5–15.5)
WBC: 43.4 10*3/uL — ABNORMAL HIGH (ref 4.0–10.5)
nRBC: 0.1 % (ref 0.0–0.2)

## 2019-03-09 LAB — D-DIMER, QUANTITATIVE: D-Dimer, Quant: 12.42 ug/mL-FEU — ABNORMAL HIGH (ref 0.00–0.50)

## 2019-03-09 MED ORDER — IPRATROPIUM BROMIDE HFA 17 MCG/ACT IN AERS
2.0000 | INHALATION_SPRAY | Freq: Four times a day (QID) | RESPIRATORY_TRACT | Status: DC
  Administered 2019-03-09 – 2019-03-10 (×7): 2 via RESPIRATORY_TRACT
  Filled 2019-03-09: qty 12.9

## 2019-03-09 MED ORDER — METHYLPREDNISOLONE SODIUM SUCC 40 MG IJ SOLR: 40.0000 mg | Freq: Once | INTRAMUSCULAR | Status: AC

## 2019-03-09 MED ORDER — FUROSEMIDE 10 MG/ML IJ SOLN
40.0000 mg | Freq: Once | INTRAMUSCULAR | Status: AC
  Administered 2019-03-09: 10:00:00 40 mg via INTRAVENOUS
  Filled 2019-03-09: qty 4

## 2019-03-09 MED ORDER — TRAZODONE HCL 50 MG PO TABS
50.0000 mg | ORAL_TABLET | Freq: Every evening | ORAL | Status: DC | PRN
  Administered 2019-03-10: 01:00:00 50 mg via ORAL
  Filled 2019-03-09: qty 1

## 2019-03-09 MED ORDER — LORAZEPAM 2 MG/ML IJ SOLN
0.5000 mg | Freq: Once | INTRAMUSCULAR | Status: AC
  Administered 2019-03-09: 16:00:00 0.5 mg via INTRAVENOUS
  Filled 2019-03-09: qty 1

## 2019-03-09 MED ORDER — PANTOPRAZOLE SODIUM 40 MG PO PACK
40.0000 mg | PACK | Freq: Every day | ORAL | Status: DC
  Administered 2019-03-10 – 2019-03-17 (×8): 40 mg
  Filled 2019-03-09 (×8): qty 20

## 2019-03-09 MED ORDER — METHYLPREDNISOLONE SODIUM SUCC 40 MG IJ SOLR
20.0000 mg | INTRAMUSCULAR | Status: AC
  Administered 2019-03-09 (×3): 20 mg via INTRAVENOUS
  Filled 2019-03-09 (×3): qty 1

## 2019-03-09 MED ORDER — METHYLPREDNISOLONE SODIUM SUCC 125 MG IJ SOLR
INTRAMUSCULAR | Status: AC
  Administered 2019-03-09: 40 mg
  Filled 2019-03-09: qty 2

## 2019-03-09 MED ORDER — METHYLPREDNISOLONE SODIUM SUCC 40 MG IJ SOLR
40.0000 mg | Freq: Two times a day (BID) | INTRAMUSCULAR | Status: DC
  Administered 2019-03-09 – 2019-03-10 (×2): 40 mg via INTRAVENOUS
  Filled 2019-03-09 (×2): qty 1

## 2019-03-09 NOTE — Progress Notes (Signed)
NAME:  Nichole Franklin, MRN:  [id number], DOB:  May 10, 1951, LOS: 18 ADMISSION DATE:  02/28/2019, CONSULTATION DATE:  7/4 REFERRING MD:  Aileen Fass, CHIEF COMPLAINT:  Found confused, tongue swelling   Brief History   68 y/o female with extensive PMH admitted June 28 in the setting of a new COVID 19 diagnosis.  Admitted to [facility] due to renal failure which improved with IV fluids.  Transferred to [facility].  On hospital day 6 she developed worsening confusion and tongue swelling felt to be a reaction to remdesivir; then was transferred to the ICU emergently for intubation.   Past Medical History  Hypertension Hyperlipidemia Diabetes mellitus Gastroesophageal reflux disease History of cervical cancer Anxiety  Significant Hospital Events   June 28 admission [facility] from [facility] for AKI, poor po intake June 30 transfer to [facility], confusion July 2: worsening hypoxemia July 3 remdesivir, worsening confusion July 4 tongue swelling, hypertension, severe encephalopathy, transfer to ICU, intubation, new worsening fever - Admitted to the ICU for worsening confusion, required emergent intubation Tongue swelling noted July 5 > July 7 tongue swelling removed, awake, not extubated July 8 extubated, some stridor July 9 mild increased work of breathing  Consults:  Nephrology, signed off PCCM  Procedures:  July 4 left IJ CVL July 4 endotracheal tube July 8 Cor-Trak gastric tube  Significant Diagnostic Tests:  June 28 CT scan abdomen pelvis for nephrolithiasis: No explanation of renal failure, no urinary calculus, extensive multifocal groundglass opacity in lungs, sclerotic inferior endplate deformities of T11 and T12 July 4 CT Head > pending  Micro Data:  7/4 resp culture > OPF  Antimicrobials:  6/28 ceftriaxone/azithro > July 3 7/3 cefepime >  7/4 Linezolid > 7/5 7/4 cefepime >    Interim history/subjective:   Extubated yesterday Had some stridor overnight, Xopenex  administered Still tachypneic, denies dyspnea, some cough with secretions in her throat.   Objective   Blood pressure (!) 152/70, pulse 100, temperature 98.1 F (36.7 C), temperature source Oral, resp. rate (!) 33, height 5\' 6"  (1.676 m), weight 81.3 kg, SpO2 99 %.        Intake/Output Summary (Last 24 hours) at 03/09/2019 1319 Last data filed at 03/09/2019 0900 Gross per 24 hour  Intake 1160.92 ml  Output 1335 ml  Net -174.08 ml   Filed Weights   03/07/19 0500 03/08/19 0448 03/09/19 0410  Weight: 80.2 kg 80.2 kg 81.3 kg    Examination:  General:  Resting comfortably in bed HENT: NCAT OP clear, no palpable neck mass PULM: some upper airway wheezing CV: RRR, no mgr GI: BS+, soft, nontender MSK: normal bulk and tone Neuro: awake, alert, no distress, MAEW  July 9 CXR images independently reviewed showing patchy infiltrate in the left lung  Resolved Hospital Problem list     Assessment & Plan:  Acute encephalopathy> improving Continue music therapy in room  Minimize sedation Frequent orientation  COVID 19 pneumonia> improved, CXR normal, oxygenation OK No remdesivir given allergy  Angioedema due to remdesivir, requirement for intubation> resolved Volume overload on exam, ? Acute pulmonary edema Tachypnea this morning, likely anxiety driven Morphine as needed for dyspnea Ativan as needed for anxiety Agree with diuresis today  Fever 7/4> due to COVID Monitor for signs of sepsis  Elevated BUN CKD Monitor BMET and UOP Replace electrolytes as needed  Mild stridor overnight: making good air movement, suspect upper airway secretions and anxiety (vocal cord dysfunction) driven Re-order solumedrol today, consider stopping in AM Ativan prn  anxiety Continue famotidine for GERD treatment Oral care per routine Sit up as much as possible Close monitoring in ICU   Best practice:  Diet: start tube feeding Pain/Anxiety/Delirium protocol (if indicated): n/a VAP protocol  (if indicated): n/a  DVT prophylaxis: sub q heparin GI prophylaxis: famotidine Glucose control: SSI Mobility: bed rest Code Status: full Family Communication: I updated her daughter [name] at length by phone today Disposition: remain in ICU  Labs   CBC: Recent Labs  Lab 03/04/19 0131  03/06/19 0430 03/06/19 1750 03/07/19 0425 03/08/19 0400 03/09/19 0518  WBC 23.2*   < > 33.0* 37.0* 38.4* 35.7* 43.4*  NEUTROABS 17.7*  --   --   --   --   --   --   HGB 8.1*   < > 6.7* 8.5* 8.8* 7.9* 8.5*  HCT 26.2*   < > 23.3* 27.4* 29.1* 25.4* 27.1*  MCV 87.0   < > 92.1 91.9 90.1 88.8 88.9  PLT 256   < > 192 188 210 195 195   < > = values in this interval not displayed.    Basic Metabolic Panel: Recent Labs  Lab 03/03/19 0330  03/04/19 0131  03/04/19 1558 03/05/19 0408 03/05/19 0836 03/05/19 1700 03/06/19 0430 03/07/19 0425 03/08/19 0400 03/09/19 0518  NA  --    < > 149*   < >  --  151* 151*  --  146* 143 142 144  K  --    < > 3.5   < >  --  4.7 4.9  --  4.3 4.0 3.8 3.8  CL  --    < > 107  --   --  117*  --   --  113* 109 109 108  CO2  --    < > 25  --   --  23  --   --  24 24 23 22   GLUCOSE  --    < > 230*  --   --  220*  --   --  260* 267* 182* 164*  BUN  --    < > 79*  --   --  92*  --   --  100* 91* 97* 93*  CREATININE  --    < > 3.23*  --   --  3.26*  --   --  3.21* 2.94* 2.90* 2.99*  CALCIUM  --    < > 7.6*  --   --  7.3*  --   --  7.7* 8.2* 8.3* 8.6*  MG 2.0  --  2.1  --  1.9 1.9  --  2.6*  --   --   --   --   PHOS  --   --   --   --  4.3 3.5  --  3.7  --   --   --   --    < > = values in this interval not displayed.   GFR: Estimated Creatinine Clearance: 19.4 mL/min (A) (by C-G formula based on SCr of 2.99 mg/dL (H)). Recent Labs  Lab 03/05/19 0408  03/06/19 1750 03/07/19 0425 03/08/19 0400 03/09/19 0518  PROCALCITON 0.94  --   --   --   --   --   WBC 21.1*   < > 37.0* 38.4* 35.7* 43.4*   < > = values in this interval not displayed.    Liver Function  Tests: Recent Labs  Lab 03/05/19 0408 03/05/19 0700 03/06/19 0430 03/07/19 0425 03/08/19 0400  AST 65* 51* 44* 118* 35  ALT 53* 52* 44 80* 65*  ALKPHOS 161* 174* 154* 226* 192*  BILITOT 0.4 0.6 0.3 0.4 0.6  PROT 5.4* 6.1* 5.9* 6.8 6.2*  ALBUMIN 2.4* 2.6* 2.5* 2.8* 2.6*   No results for input(s): LIPASE, AMYLASE in the last 168 hours. No results for input(s): AMMONIA in the last 168 hours.  ABG    Component Value Date/Time   PHART 7.361 03/05/2019 0836   PCO2ART 40.7 03/05/2019 0836   PO2ART 87.0 03/05/2019 0836   HCO3 23.0 03/05/2019 0836   TCO2 24 03/05/2019 0836   ACIDBASEDEF 2.0 03/05/2019 0836   O2SAT 96.0 03/05/2019 0836     Coagulation Profile: No results for input(s): INR, PROTIME in the last 168 hours.  Cardiac Enzymes: Recent Labs  Lab 03/04/19 1558  CKTOTAL 374*    HbA1C: Hgb A1c MFr Bld  Date/Time Value Ref Range Status  02/27/2019 02:15 AM 9.3 (H) 4.8 - 5.6 % Final    Comment:    (NOTE) Pre diabetes:          5.7%-6.4% Diabetes:              >6.4% Glycemic control for   <7.0% adults with diabetes   08/05/2010 05:10 AM (H) <5.7 % Final   8.0 (NOTE)                                                                       According to the ADA Clinical Practice Recommendations for 2011, when HbA1c is used as a screening test:   >=6.5%   Diagnostic of Diabetes Mellitus           (if abnormal result  is confirmed)  5.7-6.4%   Increased risk of developing Diabetes Mellitus  References:Diagnosis and Classification of Diabetes Mellitus,Diabetes KOEC,9507,22(VJDYN 1):S62-S69 and Standards of Medical Care in         Diabetes - 2011,Diabetes Care,2011,34  (Suppl 1):S11-S61.    CBG: Recent Labs  Lab 03/08/19 1941 03/08/19 2334 03/09/19 0347 03/09/19 0808 03/09/19 1225  GLUCAP 199* 205* 121* 171* 163*     Critical care time: 31 minutes    Roselie Awkward, MD [place] [facility]M Pager: [phone] Cell: [phone] If no response, call [phone]

## 2019-03-09 NOTE — Progress Notes (Signed)
Updated patients daughter, [name], via telephone on the change in pts respiratory status. Explained that the critical care doctor had been notified and the hospitalist was coming to the bedside to evaluate the patient- will update as new information and plan of care comes available. All questions were answered. [name] appreciative of update.

## 2019-03-09 NOTE — Progress Notes (Signed)
PROGRESS NOTE  Nichole Franklin DJM:[id number] DOB: August 15, 1951 DOA: 02/14/2019  PCP: Elwyn Reach, MD  Brief History/Interval Summary: Patient is a 68 y.o. female with PMHx of DM-2, HTN, dyslipidemia, CKD stage IV who presented to the hospital initially for evaluation of weakness, she was found to have fever and acute kidney injury.  She was subsequently admitted to [facility] her renal function stabilized-she was transferred to [facility] course since admission has been complicated by encephalopathy, angioedema following initiation of Remdesivir-and respiratory failure requiring intubation on 7/4.  Reason for Visit: Acute respiratory disease due to COVID-19  Consultants: Pulmonology  Procedures:  Intubation 7/4  Central line left IJ 7/4  Antibiotics: Anti-infectives (From admission, onward)   Start     Dose/Rate Route Frequency Ordered Stop   03/07/19 1000  ceFEPIme (MAXIPIME) 2 g in sodium chloride 0.9 % 100 mL IVPB     2 g 200 mL/hr over 30 Minutes Intravenous Every 24 hours 03/06/19 1434     03/04/19 2200  ceFEPIme (MAXIPIME) 1 g in sodium chloride 0.9 % 100 mL IVPB  Status:  Discontinued     1 g 200 mL/hr over 30 Minutes Intravenous Every 12 hours 03/04/19 1256 03/06/19 1434   03/04/19 1400  remdesivir 100 mg in sodium chloride 0.9 % 250 mL IVPB  Status:  Discontinued     100 mg 500 mL/hr over 30 Minutes Intravenous Every 24 hours 03/03/19 1305 03/04/19 0936   03/04/19 1200  linezolid (ZYVOX) IVPB 600 mg  Status:  Discontinued     600 mg 300 mL/hr over 60 Minutes Intravenous Every 12 hours 03/04/19 1141 03/05/19 1100   03/04/19 0900  linezolid (ZYVOX) IVPB 600 mg  Status:  Discontinued     600 mg 300 mL/hr over 60 Minutes Intravenous Every 12 hours 03/04/19 0759 03/04/19 1141   03/04/19 0800  ceFEPIme (MAXIPIME) 2 g in sodium chloride 0.9 % 100 mL IVPB  Status:  Discontinued     2 g 200 mL/hr over 30 Minutes Intravenous Every 12 hours  03/04/19 0706 03/04/19 1256   03/03/19 1400  remdesivir 200 mg in sodium chloride 0.9 % 250 mL IVPB     200 mg 500 mL/hr over 30 Minutes Intravenous Once 03/03/19 1305 03/03/19 1500   02/27/19 2000  azithromycin (ZITHROMAX) 500 mg in sodium chloride 0.9 % 250 mL IVPB  Status:  Discontinued     500 mg 250 mL/hr over 60 Minutes Intravenous Every 24 hours 02/27/19 0428 03/04/19 1211   02/27/19 0430  cefTRIAXone (ROCEPHIN) 1 g in sodium chloride 0.9 % 100 mL IVPB  Status:  Discontinued     1 g 200 mL/hr over 30 Minutes Intravenous Every 24 hours 02/27/19 0428 03/04/19 0706       Subjective/Interval History: Patient was extubated yesterday.  She did well for the most part but apparently had an episode where she got short of breath.  She was thought to have some stridor.  Patient was given nebulizer treatments with improvement.  Seems to be comfortable this morning.  Slightly distracted.  Denies any chest pain.   Assessment/Plan:  Acute Hypoxic Resp. Failure due to Acute Covid 19 Viral Illness  FiO2 (%):  [40 %] 40 %     Component Value Date/Time   PHART 7.361 03/05/2019 0836   PCO2ART 40.7 03/05/2019 0836   PO2ART 87.0 03/05/2019 0836   HCO3 23.0 03/05/2019 0836   TCO2 24 03/05/2019 0836   ACIDBASEDEF 2.0 03/05/2019  0836   O2SAT 96.0 03/05/2019 0836    COVID-19 Labs  Recent Labs    03/07/19 0425 03/08/19 0400 03/09/19 0518  DDIMER  --  8.04* 12.42*  FERRITIN 212 154  --   LDH 644*  --   --   CRP 1.5* <0.8  --     Lab Results  Component Value Date   SARSCOV2NAA POSITIVE (A) 02/07/2019     Fever: No fever in the last 24 hours Oxygen requirements: Patient was extubated yesterday.  Currently on high flow nasal cannula at 4 L/min.  Saturating in the 90s.   Antibiotics: On cefepime Remdesivir: Patient developed angioedema to Remdesivir Steroids: Remains on Solu-Medrol  Diuretics: Has received Lasix but not on a scheduled basis Actemra: Did not receive Convalescent  Plasma: Did not receive Vitamin C and Zinc: Continue DVT Prophylaxis: On heparin 7500 units subcutaneously every 8 hours  Patient was intubated due to angioedema thought to be secondary to Remdesivir.  Angioedema resolved.  Patient was successfully extubated yesterday.  There was some concern for stridor last night.  Patient was given inhalers.  She is stabilized.  Seems to be doing okay this morning.  Upper airway wheezing is appreciated.  Pulmonology is following.  Chest x-ray repeated today.  Stable findings noted compared to the previous films.  Patient's inflammatory markers had improved.  D-dimer however is noted to be significantly elevated today at 12.42.  Reason for this is not entirely clear.  WBC also noted to be climbing at 43.4 today.  Reason for this also not entirely clear.  Patient is on steroids which could be contributing.  Continue to monitor for now.  We will check a procalcitonin level tomorrow.  Patient remains on cefepime which was initiated due to concern for superimposed bacterial infection.  Elevated d-dimer Patient was empirically started on IV heparin on 7/4 due to concern for VTE.  Due to drop in hemoglobin this was discontinued.  Lower extremity Doppler study was negative for DVT.  Low suspicion for VTE at this time.  D-dimer continues to rise.  Continue high dose subcutaneous heparin for now.   Leukocytosis Patient's WBC continues to rise.  She is afebrile.  No diarrhea noted.  Patient remains on cefepime.  No new source of infection identified.  Could still be due to steroids.  Continue to monitor WBC.   Acute metabolic encephalopathy Most likely due to hypoxemia and other acute metabolic derangements.  CT scan done on 7/4 did not show any acute findings.  Noted to be distracted.  Not really communicative.  Continue to monitor.  Angioedema Most likely secondary to Remdesivir.  Tongue swelling has improved.  Acute kidney injury on chronic kidney disease stage IV  Baseline creatinine around 1.6-2.  Presented with a BUN of 81 and a creatinine of 7.09.  Renal function had improved. HAs had good urine output.  BUN remains elevated at 93.  Creatinine is 2.99 today.  Continue to monitor daily.  Monitor urine output.   Hypernatremia Resolved with free water.  Monitor electrolytes closely.  Diabetes mellitus type 2 with uncontrolled hyperglycemia No longer on insulin infusion.  Patient is on Lantus insulin and SSI.  HbA1c 9.3.  CBGs are reasonably well controlled.  Continue to monitor.   Essential hypertension Blood pressure not optimally controlled.  Noted to be elevated this morning.  Continue amlodipine and labetalol PRN for now.   Normocytic anemia  Hemoglobin stable.  No evidence of overt bleeding.  She was transfused 1 unit  of PRBC on 7/6.  Continue to monitor.    Nutrition Being fed through a feeding tube.  Speech therapy to evaluate.   DVT Prophylaxis: Subcutaneous heparin PUD Prophylaxis: Protonix Code Status: Full code Family Communication: Pulmonology has been discussing with patient's family. Disposition Plan: PT and OT to evaluate.  Speech therapy to evaluate.  Will remain in ICU for today.   Medications:  Scheduled: . sodium chloride   Intravenous Once  . amLODipine  10 mg Per Tube Daily  . chlorhexidine  15 mL Mouth Rinse BID  . Chlorhexidine Gluconate Cloth  6 each Topical Q0600  . cholecalciferol  1,000 Units Per Tube Daily  . feeding supplement (PRO-STAT SUGAR FREE 64)  30 mL Oral BID  . ferrous sulfate  300 mg Per Tube BID WC  . free water  300 mL Per Tube Q4H  . heparin injection (subcutaneous)  7,500 Units Subcutaneous Q8H  . insulin aspart  0-20 Units Subcutaneous Q4H  . insulin glargine  25 Units Subcutaneous BID  . ipratropium  2 puff Inhalation Q6H  . labetalol  10 mg Intravenous Q8H  . levalbuterol  2 puff Inhalation Q6H  . mouth rinse  15 mL Mouth Rinse q12n4p  . methylPREDNISolone (SOLU-MEDROL) injection  20 mg  Intravenous Q4H  . [START ON 03/10/2019] pantoprazole sodium  40 mg Per Tube Daily  . vitamin C  1,000 mg Per Tube Daily  . zinc sulfate  220 mg Per Tube Daily   Continuous: . sodium chloride 10 mL/hr at 03/09/19 0900  . ceFEPime (MAXIPIME) IV 2 g (03/09/19 0849)  . dexmedetomidine (PRECEDEX) IV infusion Stopped (03/08/19 1443)  . feeding supplement (JEVITY 1.2 CAL) 948 mL (03/08/19 1323)   UMP:[id number] **OR** acetaminophen, dextrose, EPINEPHrine, fentaNYL (SUBLIMAZE) injection, guaiFENesin-dextromethorphan, hydrALAZINE, Ipratropium-Albuterol, lip balm, midazolam, morphine injection, ondansetron **OR** ondansetron (ZOFRAN) IV   Objective:  Vital Signs  Vitals:   03/09/19 0500 03/09/19 0600 03/09/19 0700 03/09/19 0850  BP: (!) 167/77 (!) 167/79 (!) 160/73   Pulse: (!) 103 (!) 102 88   Resp: (!) 27 (!) 37 (!) 33   Temp:      TempSrc:      SpO2: 100% 100% 100% 98%  Weight:      Height:        Intake/Output Summary (Last 24 hours) at 03/09/2019 1148 Last data filed at 03/09/2019 0900 Gross per 24 hour  Intake 1425.22 ml  Output 1335 ml  Net 90.22 ml   Filed Weights   03/07/19 0500 03/08/19 0448 03/09/19 0410  Weight: 80.2 kg 80.2 kg 81.3 kg    General appearance: Awake alert.  Mildly distracted.  Not really communicative.  Resp: Coarse breath sounds bilaterally.  Crackles at the bases.  Upper airway wheezing is noted. Cardio: S1-S2 is normal regular.  No S3-S4.  No rubs murmurs or bruit GI: Abdomen is soft.  Nontender nondistended.  Bowel sounds are present normal.  No masses organomegaly Extremities: No edema.   Neurologic: Does not answer any questions.  No obvious facial asymmetry.  Generalized deconditioning is noted.  No focal deficits appreciated.   Lab Results:  Data Reviewed: I have personally reviewed following labs and imaging studies  CBC: Recent Labs  Lab 03/04/19 0131  03/06/19 0430 03/06/19 1750 03/07/19 0425 03/08/19 0400 03/09/19 0518  WBC  23.2*   < > 33.0* 37.0* 38.4* 35.7* 43.4*  NEUTROABS 17.7*  --   --   --   --   --   --  HGB 8.1*   < > 6.7* 8.5* 8.8* 7.9* 8.5*  HCT 26.2*   < > 23.3* 27.4* 29.1* 25.4* 27.1*  MCV 87.0   < > 92.1 91.9 90.1 88.8 88.9  PLT 256   < > 192 188 210 195 195   < > = values in this interval not displayed.    Basic Metabolic Panel: Recent Labs  Lab 03/03/19 0330  03/04/19 0131  03/04/19 1558 03/05/19 0408 03/05/19 0836 03/05/19 1700 03/06/19 0430 03/07/19 0425 03/08/19 0400 03/09/19 0518  NA  --    < > 149*   < >  --  151* 151*  --  146* 143 142 144  K  --    < > 3.5   < >  --  4.7 4.9  --  4.3 4.0 3.8 3.8  CL  --    < > 107  --   --  117*  --   --  113* 109 109 108  CO2  --    < > 25  --   --  23  --   --  24 24 23 22   GLUCOSE  --    < > 230*  --   --  220*  --   --  260* 267* 182* 164*  BUN  --    < > 79*  --   --  92*  --   --  100* 91* 97* 93*  CREATININE  --    < > 3.23*  --   --  3.26*  --   --  3.21* 2.94* 2.90* 2.99*  CALCIUM  --    < > 7.6*  --   --  7.3*  --   --  7.7* 8.2* 8.3* 8.6*  MG 2.0  --  2.1  --  1.9 1.9  --  2.6*  --   --   --   --   PHOS  --   --   --   --  4.3 3.5  --  3.7  --   --   --   --    < > = values in this interval not displayed.    GFR: Estimated Creatinine Clearance: 19.4 mL/min (A) (by C-G formula based on SCr of 2.99 mg/dL (H)).  Liver Function Tests: Recent Labs  Lab 03/05/19 0408 03/05/19 0700 03/06/19 0430 03/07/19 0425 03/08/19 0400  AST 65* 51* 44* 118* 35  ALT 53* 52* 44 80* 65*  ALKPHOS 161* 174* 154* 226* 192*  BILITOT 0.4 0.6 0.3 0.4 0.6  PROT 5.4* 6.1* 5.9* 6.8 6.2*  ALBUMIN 2.4* 2.6* 2.5* 2.8* 2.6*    Cardiac Enzymes: Recent Labs  Lab 03/04/19 1558  CKTOTAL 374*     CBG: Recent Labs  Lab 03/08/19 1551 03/08/19 1941 03/08/19 2334 03/09/19 0347 03/09/19 0808  GLUCAP 162* 199* 205* 121* 171*     Anemia Panel: Recent Labs    03/07/19 0425 03/08/19 0400  FERRITIN 212 154    Recent Results (from the past  240 hour(s))  Culture, respiratory (non-expectorated)     Status: None   Collection Time: 03/04/19 11:41 AM   Specimen: Tracheal Aspirate; Respiratory  Result Value Ref Range Status   Specimen Description   Final    TRACHEAL ASPIRATE Performed at [facility] [address], [place], [place] [address]    Special Requests   Final    NONE Performed at [facility], [address] [place], [place] [address]    Gram Stain   Final    RARE WBC PRESENT, PREDOMINANTLY PMN MODERATE SQUAMOUS EPITHELIAL CELLS PRESENT ABUNDANT GRAM POSITIVE COCCI    Culture   Final    MODERATE Consistent with normal respiratory flora. Performed at [facility], [place] [address], [place], [place] [address]    Report Status 03/07/2019 FINAL  Final  MRSA PCR Screening     Status: None   Collection Time: 03/04/19  7:08 PM   Specimen: Nasal Mucosa; Nasopharyngeal  Result Value Ref Range Status   MRSA by PCR NEGATIVE NEGATIVE Final    Comment:        The GeneXpert MRSA Assay (FDA approved for NASAL specimens only), is one component of a comprehensive MRSA colonization surveillance program. It is not intended to diagnose MRSA infection nor to guide or monitor treatment for MRSA infections. Performed at [facility], [place] [address], [place], [place] [address]       Radiology Studies: Dg Chest Port 1 View  Result Date: 03/09/2019 CLINICAL DATA:  Pneumonia EXAM: PORTABLE CHEST 1 VIEW COMPARISON:  03/08/2019 FINDINGS: Interval removal of the endotracheal tube. Feeding tube in satisfactory position. Left jugular central venous catheter in unchanged position. Peripheral hazy airspace disease in the left lung similar in appearance to the prior examination. No pleural effusion or pneumothorax. Stable cardiomediastinal silhouette. No aggressive osseous lesion. IMPRESSION: Stable peripheral hazy area airspace disease in the left lung likely secondary to  an infectious etiology. Electronically Signed   By: Kathreen Devoid   On: 03/09/2019 10:19   Dg Chest Port 1 View  Result Date: 03/08/2019 CLINICAL DATA:  COVID-19 pneumonia.  Endotracheal tube present. EXAM: PORTABLE CHEST 1 VIEW COMPARISON:  03/07/2019 and 03/04/2019 FINDINGS: Endotracheal tube tip is 5 cm above the carina. NG tube tip is in the fundus of the stomach. Central venous catheter tip is in the superior vena cava at the level of the azygos vein, unchanged. Faint peripheral infiltrates in the left lung appears slightly improved. Right lung is clear. No effusion. Heart size and vascularity are normal. IMPRESSION: Slight improvement in the peripheral infiltrates in the left lung. Electronically Signed   By: Lorriane Shire M.D.   On: 03/08/2019 07:04       LOS: 11 days   [facility] Pager on [email or web address]  03/09/2019, 11:48 AM

## 2019-03-09 NOTE — Progress Notes (Signed)
Called to update pts daughter, [name], on the plan of care. Message left on voicemail to return the call. Will re-attempt call later.

## 2019-03-09 NOTE — Progress Notes (Signed)
Updated patients daughter, [name], via telephone on the changes to the patients plan of care. [name] appreciative of the updates and requests to be notified of any changes throughout the night. All questions were answered. Will closely monitor pt status and update as needed.

## 2019-03-09 NOTE — Progress Notes (Signed)
RN heard audible wheezing coming from patient. RN to assess patient and inquire if something was wrong. Patient physically agitated and in distress at that time. Patient has been tachycardic to the 110s and tachypneic to the 30s-40s throughout shift. BP has been elevated as well. Scheduled labetalol given along with PRN hydralazine and SBP still elevated to 202.   RN auscultated lungs and heard scattered wheezing along with what sounded like stridor in her airway. Patient extubated 03/08/2019 followi ng an intubation for anaphylaxis and angioedema. [place] notified. Per order, patient given 2 mg of versed and 2 puffs of xopenex - see MAR. Deterding, MD called [facility] to come and assess patient. Patient with response to medications given.  Adefoso, MD to bedside to evaluate patient. See new orders. At this time patient less agitated and in less distress as to previously. Patient still tachycardic to the 110s and tachypneic to the 30s-40s.  Family updated throughout events. Will continue to closely monitor patient.

## 2019-03-09 NOTE — Progress Notes (Signed)
Spoke with patient daughter via face time. udate was pleasant. She expressed her thans

## 2019-03-10 ENCOUNTER — Inpatient Hospital Stay (HOSPITAL_COMMUNITY): Payer: Medicare HMO

## 2019-03-10 LAB — POCT I-STAT 7, (LYTES, BLD GAS, ICA,H+H)
Acid-base deficit: 4 mmol/L — ABNORMAL HIGH (ref 0.0–2.0)
Acid-base deficit: 2 mmol/L (ref 0.0–2.0)
Bicarbonate: 19.3 mmol/L — ABNORMAL LOW (ref 20.0–28.0)
Bicarbonate: 24.2 mmol/L (ref 20.0–28.0)
Calcium, Ion: 1.22 mmol/L (ref 1.15–1.40)
Calcium, Ion: 1.23 mmol/L (ref 1.15–1.40)
HCT: 24 % — ABNORMAL LOW (ref 36.0–46.0)
HCT: 24 % — ABNORMAL LOW (ref 36.0–46.0)
Hemoglobin: 8.2 g/dL — ABNORMAL LOW (ref 12.0–15.0)
Hemoglobin: 8.2 g/dL — ABNORMAL LOW (ref 12.0–15.0)
O2 Saturation: 88 %
O2 Saturation: 100 %
Patient temperature: 98.7
Patient temperature: 99.3
Potassium: 4 mmol/L (ref 3.5–5.1)
Potassium: 4.2 mmol/L (ref 3.5–5.1)
Sodium: 139 mmol/L (ref 135–145)
Sodium: 140 mmol/L (ref 135–145)
TCO2: 20 mmol/L — ABNORMAL LOW (ref 22–32)
TCO2: 26 mmol/L (ref 22–32)
pCO2 arterial: 29.7 mmHg — ABNORMAL LOW (ref 32.0–48.0)
pCO2 arterial: 46.9 mmHg (ref 32.0–48.0)
pH, Arterial: 7.422 (ref 7.350–7.450)
pH, Arterial: 7.322 — ABNORMAL LOW (ref 7.350–7.450)
pO2, Arterial: 53 mmHg — ABNORMAL LOW (ref 83.0–108.0)
pO2, Arterial: 515 mmHg — ABNORMAL HIGH (ref 83.0–108.0)

## 2019-03-10 LAB — BASIC METABOLIC PANEL
Anion gap: 13 (ref 5–15)
BUN: 102 mg/dL — ABNORMAL HIGH (ref 8–23)
CO2: 20 mmol/L — ABNORMAL LOW (ref 22–32)
Calcium: 8.2 mg/dL — ABNORMAL LOW (ref 8.9–10.3)
Chloride: 107 mmol/L (ref 98–111)
Creatinine, Ser: 3.07 mg/dL — ABNORMAL HIGH (ref 0.44–1.00)
GFR calc Af Amer: 17 mL/min — ABNORMAL LOW (ref >60)
GFR calc non Af Amer: 15 mL/min — ABNORMAL LOW (ref >60)
Glucose, Bld: 360 mg/dL — ABNORMAL HIGH (ref 70–99)
Potassium: 3.9 mmol/L (ref 3.5–5.1)
Sodium: 140 mmol/L (ref 135–145)

## 2019-03-10 LAB — CBC WITH DIFFERENTIAL/PLATELET
Abs Immature Granulocytes: 1.18 10*3/uL — ABNORMAL HIGH (ref 0.00–0.07)
Basophils Absolute: 0.1 10*3/uL (ref 0.0–0.1)
Basophils Relative: 0 %
Eosinophils Absolute: 0 10*3/uL (ref 0.0–0.5)
Eosinophils Relative: 0 %
HCT: 24.6 % — ABNORMAL LOW (ref 36.0–46.0)
Hemoglobin: 7.7 g/dL — ABNORMAL LOW (ref 12.0–15.0)
Immature Granulocytes: 3 %
Lymphocytes Relative: 2 %
Lymphs Abs: 0.8 10*3/uL (ref 0.7–4.0)
MCH: 28.3 pg (ref 26.0–34.0)
MCHC: 31.3 g/dL (ref 30.0–36.0)
MCV: 90.4 fL (ref 80.0–100.0)
Monocytes Absolute: 3.9 10*3/uL — ABNORMAL HIGH (ref 0.1–1.0)
Monocytes Relative: 9 %
Neutro Abs: 38.4 10*3/uL — ABNORMAL HIGH (ref 1.7–7.7)
Neutrophils Relative %: 86 %
Platelets: 181 10*3/uL (ref 150–400)
RBC: 2.72 MIL/uL — ABNORMAL LOW (ref 3.87–5.11)
RDW: 16.7 % — ABNORMAL HIGH (ref 11.5–15.5)
WBC: 44.4 10*3/uL — ABNORMAL HIGH (ref 4.0–10.5)
nRBC: 0.1 % (ref 0.0–0.2)

## 2019-03-10 LAB — GLUCOSE, CAPILLARY
Glucose-Capillary: 309 mg/dL — ABNORMAL HIGH (ref 70–99)
Glucose-Capillary: 349 mg/dL — ABNORMAL HIGH (ref 70–99)
Glucose-Capillary: 275 mg/dL — ABNORMAL HIGH (ref 70–99)
Glucose-Capillary: 282 mg/dL — ABNORMAL HIGH (ref 70–99)
Glucose-Capillary: 183 mg/dL — ABNORMAL HIGH (ref 70–99)

## 2019-03-10 LAB — D-DIMER, QUANTITATIVE: D-Dimer, Quant: 8.87 ug/mL-FEU — ABNORMAL HIGH (ref 0.00–0.50)

## 2019-03-10 LAB — PROCALCITONIN: Procalcitonin: 2.45 ng/mL

## 2019-03-10 MED ORDER — ROCURONIUM BROMIDE 10 MG/ML (PF) SYRINGE
PREFILLED_SYRINGE | INTRAVENOUS | Status: AC
  Administered 2019-03-10: 18:00:00 40 mg via INTRAVENOUS
  Filled 2019-03-10: qty 10

## 2019-03-10 MED ORDER — INSULIN GLARGINE 100 UNIT/ML ~~LOC~~ SOLN
35.0000 [IU] | Freq: Two times a day (BID) | SUBCUTANEOUS | Status: DC
  Administered 2019-03-10 – 2019-03-12 (×5): 35 [IU] via SUBCUTANEOUS
  Filled 2019-03-10 (×6): qty 0.35

## 2019-03-10 MED ORDER — IPRATROPIUM BROMIDE HFA 17 MCG/ACT IN AERS
2.0000 | INHALATION_SPRAY | Freq: Four times a day (QID) | RESPIRATORY_TRACT | Status: DC | PRN
  Filled 2019-03-10: qty 12.9

## 2019-03-10 MED ORDER — MORPHINE SULFATE (PF) 2 MG/ML IV SOLN: 1.0000 mg | INTRAVENOUS | Status: DC | PRN

## 2019-03-10 MED ORDER — MIDAZOLAM HCL 2 MG/2ML IJ SOLN
INTRAMUSCULAR | Status: AC
  Administered 2019-03-10: 18:00:00 2 mg via INTRAVENOUS
  Filled 2019-03-10: qty 4

## 2019-03-10 MED ORDER — MIDAZOLAM HCL 2 MG/2ML IJ SOLN
2.0000 mg | Freq: Once | INTRAMUSCULAR | Status: AC
  Administered 2019-03-10: 2 mg via INTRAVENOUS

## 2019-03-10 MED ORDER — ETOMIDATE 2 MG/ML IV SOLN
INTRAVENOUS | Status: AC
  Administered 2019-03-10: 18:00:00 40 mg via INTRAVENOUS
  Filled 2019-03-10: qty 20

## 2019-03-10 MED ORDER — CHLORHEXIDINE GLUCONATE 0.12% ORAL RINSE (MEDLINE KIT)
15.0000 mL | Freq: Two times a day (BID) | OROMUCOSAL | Status: DC
  Administered 2019-03-10 – 2019-03-17 (×15): 15 mL via OROMUCOSAL

## 2019-03-10 MED ORDER — ROCURONIUM BROMIDE 10 MG/ML (PF) SYRINGE
40.0000 mg | PREFILLED_SYRINGE | Freq: Once | INTRAVENOUS | Status: AC
  Administered 2019-03-10: 18:00:00 40 mg via INTRAVENOUS

## 2019-03-10 MED ORDER — ORAL CARE MOUTH RINSE
15.0000 mL | OROMUCOSAL | Status: DC
  Administered 2019-03-10 – 2019-03-17 (×67): 15 mL via OROMUCOSAL

## 2019-03-10 MED ORDER — FENTANYL CITRATE (PF) 100 MCG/2ML IJ SOLN
INTRAMUSCULAR | Status: AC
  Administered 2019-03-10: 50 ug via INTRAVENOUS
  Filled 2019-03-10: qty 2

## 2019-03-10 MED ORDER — FENTANYL CITRATE (PF) 100 MCG/2ML IJ SOLN
50.0000 ug | Freq: Once | INTRAMUSCULAR | Status: AC
  Administered 2019-03-10: 18:00:00 50 ug via INTRAVENOUS

## 2019-03-10 MED ORDER — MIDAZOLAM HCL 2 MG/2ML IJ SOLN
1.0000 mg | INTRAMUSCULAR | Status: AC | PRN
  Administered 2019-03-10 – 2019-03-11 (×3): 1 mg via INTRAVENOUS
  Filled 2019-03-10: qty 2

## 2019-03-10 MED ORDER — FENTANYL CITRATE (PF) 100 MCG/2ML IJ SOLN
25.0000 ug | INTRAMUSCULAR | Status: DC | PRN
  Administered 2019-03-13: 25 ug via INTRAVENOUS

## 2019-03-10 MED ORDER — FENTANYL CITRATE (PF) 100 MCG/2ML IJ SOLN
25.0000 ug | INTRAMUSCULAR | Status: DC | PRN
  Administered 2019-03-10 – 2019-03-13 (×11): 100 ug via INTRAVENOUS
  Filled 2019-03-10 (×6): qty 2

## 2019-03-10 MED ORDER — LEVALBUTEROL TARTRATE 45 MCG/ACT IN AERO
2.0000 | INHALATION_SPRAY | Freq: Four times a day (QID) | RESPIRATORY_TRACT | Status: DC | PRN
  Filled 2019-03-10: qty 15

## 2019-03-10 MED ORDER — MIDAZOLAM HCL 2 MG/2ML IJ SOLN
1.0000 mg | INTRAMUSCULAR | Status: DC | PRN
  Administered 2019-03-11 – 2019-03-14 (×6): 1 mg via INTRAVENOUS
  Filled 2019-03-10 (×7): qty 2

## 2019-03-10 MED ORDER — ETOMIDATE 2 MG/ML IV SOLN
40.0000 mg | Freq: Once | INTRAVENOUS | Status: AC
  Administered 2019-03-10: 18:00:00 40 mg via INTRAVENOUS

## 2019-03-10 NOTE — Progress Notes (Signed)
SLP Cancellation Note  Patient Details Name: Elke Holtry MRN: [id number] DOB: 01-06-1951   Cancelled assessment: Per chart review, discussion with charge nurse, pt's change in [place] won't allow participation in swallow assessment today.  Pt does have cortrak for nutrition.  SLP services will not be available Sat, 7/11 but WILL be available Sun, 7/12 if warranted.  Will follow along for readiness.   Adea Geisel L. Tivis Ringer, [place] Office number [phone] Pager [phone]         Juan Quam Laurice 03/10/2019, 2:18 PM

## 2019-03-10 NOTE — Progress Notes (Signed)
Patient continues to remain lethargic and not following commands. Erick Alley, MD. Head CT ordered.

## 2019-03-10 NOTE — Progress Notes (Signed)
OT Evaluation  Spoke with pt's daughter Nichole Franklin) over the phone to get PLOF. Pt apparently a "pistol", per her daughter and very independent. She enjoys R&B Music; watching boxing/fighting and enjoys hanging out with her friends. Pt occasionally used a cane due to a sore knee from a recent fall down the steps. Nursing states pt has had a change in mentation, but also had meds last night which could be contributing to her lethargy. Pt mot following commands but more alert sitting EOB. Spontaneous movement BUE. VSS during session on 2L. Will follow acutely and recommend SNF for rehab.     03/10/19 1100  OT Visit Information  Last OT Received On 03/10/19  Assistance Needed +2  PT/OT/SLP Co-Evaluation/Treatment Yes  Reason for Co-Treatment Complexity of the patient's impairments (multi-system involvement);For patient/therapist safety  OT goals addressed during session ADL's and self-care;Strengthening/ROM  History of Present Illness 68 y.o. female with history of diabetes mellitus type 2, hypertension, DDD,  hyperlipidemia has been feeling weak last 3 to 4 days.  Patient had contact with her sister was positive for COVID-19.  On hospital day 6 she developed worsening confusion and tongue swelling felt to be a reaction to remdesivir; then was transferred to the ICU emergently for intubation. Extubated 7/8; cortrak g-tube 7/8; head CT 7/4 negative for acute changes;   Precautions  Precautions Fall  Home Living  Family/patient expects to be discharged to: Private residence  Living Arrangements Children  Available Help at Discharge Family;Available PRN/intermittently  Type of Home House  Home Access Stairs to enter  Entrance Stairs-Number of Steps 4  Entrance Stairs-Rails None  Home Layout Two level;Bed/bath upstairs  Alternate Level Stairs-Number of Steps flight  Bathroom Shower/Tub Tub/shower unit;Curtain  Corporate treasurer Yes  How Accessible Accessible via  walker  [place] - single point  Additional Comments Lives with daughter and her family  Prior Function  Level of Independence Independent  Comments Occasionally used a cane due to a recent fall down the stairs; 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" - very independent; enjoys music (R&B; dance); likes to watch boxing adn fighting adn enjoys handging out with her friends; Nickname "Nichole Franklin"  Communication  Communication No difficulties  Pain Assessment  Pain Assessment Faces  Faces Pain Scale 0  Cognition  Arousal/Alertness Lethargic;Suspect due to medications (RN reports sedative meds given last night)  Behavior During Therapy Flat affect  Overall Cognitive Status Difficult to assess  Difficult to assess due to Level of arousal  Upper Extremity Assessment  Upper Extremity Assessment RUE deficits/detail;LUE deficits/detail  RUE Deficits / Details minimal spontaneous movement. Not moving on command. PROM WFL with the exception of mild stiffness in R PIPs. At risk for contractures  RUE Coordination decreased fine motor;decreased gross motor  LUE Deficits / Details PROM WFL. spontaneous movemetn but not to command  Lower Extremity Assessment  RLE Deficits / Details PROM WFLhip, knee, ankle; no AROM noted  LLE Deficits / Details PROM WFL hip, knee, ankle; actively withdrew to tickling foot  Cervical / Trunk Assessment  Cervical / Trunk Assessment Other exceptions  Cervical / Trunk Exceptions obese; at EOB when support to her trunk decreased and she began to lean backwards, her abdominals contracted but not strong enough to right herself  ADL  General ADL Comments total A at this time; attempted hand over hand  Vision- Assessment  Additional Comments unsure if pt wears glasses  Bed Mobility  Overal bed mobility Needs Assistance  Bed  Mobility Supine to Sit;Sit to Supine  Supine to sit Total assist;+2 for physical assistance;+2 for safety/equipment;HOB elevated  Sit to supine  Total assist;+2 for physical assistance;+2 for safety/equipment  General bed mobility comments eyes open throughout session  Transfers  General transfer comment unable to attempt due to decr arousal, not following commands  Balance  Overall balance assessment Needs assistance  Sitting-balance support Feet supported;Bilateral upper extremity supported (hands resting on bed; pt not actively using for support)  Sitting balance-Leahy Scale Zero  Sitting balance - Comments sat EOB x 5 minutes with slight incr alertness; VSS  General Comments  General comments (skin integrity, edema, etc.) more alert sitting EOB but not following commands; appeared to track therapist on occasion but not consistently; more spontaneous movemetn of BUE once sitting EOB  Exercises  Exercises Other exercises  Other Exercises  Other Exercises BUE PROM through full ROM  OT - End of Session  Equipment Utilized During Treatment Oxygen (2L)  Activity Tolerance Patient limited by lethargy  Patient left in bed;with call bell/phone within reach (semi chair position)  Nurse Communication Other (comment) (level of arousal)  OT Assessment  OT Recommendation/Assessment Patient needs continued OT Services  OT Visit Diagnosis Other abnormalities of gait and mobility (R26.89);Muscle weakness (generalized) (M62.81);History of falling (Z91.81);Other symptoms and signs involving cognitive function  OT Problem List Decreased strength;Decreased activity tolerance;Impaired balance (sitting and/or standing);Decreased coordination;Decreased cognition;Decreased safety awareness;Decreased knowledge of use of DME or AE;Cardiopulmonary status limiting activity;Obesity;Impaired UE functional use  OT Plan  OT Frequency (ACUTE ONLY) Min 2X/week  OT Treatment/Interventions (ACUTE ONLY) Self-care/ADL training;Therapeutic exercise;Neuromuscular education;Energy conservation;DME and/or AE instruction;Therapeutic activities;Cognitive  remediation/compensation;Patient/family education;Balance training  AM-PAC OT "6 Clicks" Daily Activity Outcome Measure (Version 2)  Help from another person eating meals? 1  Help from another person taking care of personal grooming? 1  Help from another person toileting, which includes using toliet, bedpan, or urinal? 1  Help from another person bathing (including washing, rinsing, drying)? 1  Help from another person to put on and taking off regular upper body clothing? 1  Help from another person to put on and taking off regular lower body clothing? 1  6 Click Score 6  OT Recommendation  Follow Up Recommendations SNF;Supervision/Assistance - 24 hour  OT Equipment Other (comment) (TBA)  Individuals Consulted  Consulted and Agree with Results and Recommendations Family member/caregiver  Family Member Consulted daughter Nichole Franklin  Acute Rehab OT Goals  Patient Stated Goal unable to state  OT Goal Formulation With family  Time For Goal Achievement 03/24/19  Potential to Achieve Goals Good  OT Time Calculation  OT Start Time (ACUTE ONLY) 0935  OT Stop Time (ACUTE ONLY) 0959  OT Time Calculation (min) 24 min  OT General Charges  $OT Visit 1 Visit  OT Evaluation  $OT Eval Moderate Complexity 1 Mod  Written Expression  Dominant Hand Right  Maurie Boettcher, OT/L   Acute OT Clinical Specialist Acute Rehabilitation Services Pager [phone] Office [phone]

## 2019-03-10 NOTE — Procedures (Signed)
Intubation Procedure Note Nichole Franklin [id number] May 01, 1951  Procedure: Intubation Indications: Airway protection and maintenance  Procedure Details Consent: Risks of procedure as well as the alternatives and risks of each were explained to the (patient/caregiver).  Consent for procedure obtained. Time Out: Verified patient identification, verified procedure, site/side was marked, verified correct patient position, special equipment/implants available, medications/allergies/relevent history reviewed, required imaging and test results available.  Performed  Drugs Etomidate 16m IV, Rocuronium 467mIV, versed 6m28mV fenatnyl 15m66mV DL x 1 with MAC 4 blade Grade 1 view 7.5 ET tube passed through cords under direct visualization Placement confirmed with bilateral breath sounds, positive EtCO2 change and smoke in tube   Evaluation Hemodynamic Status: BP stable throughout; O2 sats: stable throughout Patient's Current Condition: stable Complications: No apparent complications Patient did tolerate procedure well. Chest X-ray ordered to verify placement.  CXR: pending.   DougSimonne Maffucci0/2020

## 2019-03-10 NOTE — Progress Notes (Signed)
NAME:  Nichole Franklin, MRN:  [id number], DOB:  [birth year], LOS: 12 ADMISSION DATE:  02/28/2019, CONSULTATION DATE:  7/4 REFERRING MD:  Aileen Fass, CHIEF COMPLAINT:  Found confused, tongue swelling   Brief History   68 y/o female with extensive PMH admitted June 28 in the setting of a new COVID 19 diagnosis.  Admitted to [facility] due to renal failure which improved with IV fluids.  Transferred to [facility].  On hospital day 6 she developed worsening confusion and tongue swelling felt to be a reaction to remdesivir; then was transferred to the ICU emergently for intubation.   Past Medical History  Hypertension Hyperlipidemia Diabetes mellitus Gastroesophageal reflux disease History of cervical cancer Anxiety  Significant Hospital Events   June 28 admission [facility] from [facility] for AKI, poor po intake June 30 transfer to [facility], confusion July 2: worsening hypoxemia July 3 remdesivir, worsening confusion July 4 tongue swelling, hypertension, severe encephalopathy, transfer to ICU, intubation, new worsening fever - Admitted to the ICU for worsening confusion, required emergent intubation Tongue swelling noted July 5 > July 7 tongue swelling removed, awake, not extubated July 8 extubated, some stridor July 9 mild increased work of breathing, steroids added, did not sleep the night before, trazodone added  Consults:  Nephrology, signed off PCCM  Procedures:  July 4 left IJ CVL July 4 endotracheal tube July 8 Cor-Trak gastric tube  Significant Diagnostic Tests:  June 28 CT scan abdomen pelvis for nephrolithiasis: No explanation of renal failure, no urinary calculus, extensive multifocal groundglass opacity in lungs, sclerotic inferior endplate deformities of T11 and T12 July 4 CT Head > pending  Micro Data:  7/4 resp culture > OPF  Antimicrobials:  6/28 ceftriaxone/azithro > July 3 7/3 cefepime >  7/4 Linezolid > 7/5 7/4 cefepime >    Interim  history/subjective:   Yesterday: mild increased work of breathing, steroids added, did not sleep the night before, trazodone added Today: Remains very sleepy, received morphine around 6 AM, received trazodone last night, worked with PT but physical therapy had to do the majority of the work   Objective   Blood pressure 137/74, pulse (!) 102, temperature 98.7 F (37.1 C), temperature source Axillary, resp. rate (!) 27, height 5\' 6"  (1.676 m), weight 80.9 kg, SpO2 97 %.        Intake/Output Summary (Last 24 hours) at 03/10/2019 1226 Last data filed at 03/10/2019 1000 Gross per 24 hour  Intake 3582.15 ml  Output 2140 ml  Net 1442.15 ml   Filed Weights   03/08/19 0448 03/09/19 0410 03/10/19 0411  Weight: 80.2 kg 81.3 kg 80.9 kg    Examination:  General:  Resting in bed, very drowsy HENT: NCAT OP clear PULM: CTA B, normal effort CV: RRR, no mgr GI: BS+, soft, nontender MSK: normal bulk and tone Neuro: drowsy, will open eyes to touch, voice but not following commands    Resolved Hospital Problem list     Assessment & Plan:  Acute encephalopathy> worse again this morning, suspect related to trazodone and morphine. Lack of sleep has led to ICU delirium Currently does not need to be intubated but needs to be closely monitored for airway protection Minimize sedation: stop trazadone, decrease prn dose of morphine Frequent orientation continue music therapy Continue attempts at mobility Agree with head CT  COVID 19 pneumonia> improved, CXR normal, oxygenation OK No remdesivir given allergy  Angioedema due to remdesivir, requirement for intubation> resolved Mild stridor 7/9> resolved Respiratory status significantly improved  July 10 Monitor respiratory status closely in ICU  Fever 7/4> due to COVID Monitor for signs of sepsis  Elevated BUN CKD Monitor BMET and UOP Replace electrolytes as needed    Best practice:  Diet: start tube feeding Pain/Anxiety/Delirium  protocol (if indicated): n/a VAP protocol (if indicated): n/a  DVT prophylaxis: sub q heparin GI prophylaxis: famotidine Glucose control: SSI Mobility: bed rest Code Status: full Family Communication: I updated her daughter [name] at length by phone today Disposition: remain in ICU  Labs   CBC: Recent Labs  Lab 03/04/19 0131  03/06/19 1750 03/07/19 0425 03/08/19 0400 03/09/19 0518 03/10/19 0445 03/10/19 1150  WBC 23.2*   < > 37.0* 38.4* 35.7* 43.4* 44.4*  --   NEUTROABS 17.7*  --   --   --   --   --  38.4*  --   HGB 8.1*   < > 8.5* 8.8* 7.9* 8.5* 7.7* 8.2*  HCT 26.2*   < > 27.4* 29.1* 25.4* 27.1* 24.6* 24.0*  MCV 87.0   < > 91.9 90.1 88.8 88.9 90.4  --   PLT 256   < > 188 210 195 195 181  --    < > = values in this interval not displayed.    Basic Metabolic Panel: Recent Labs  Lab 03/04/19 0131  03/04/19 1558 03/05/19 0408  03/05/19 1700 03/06/19 0430 03/07/19 0425 03/08/19 0400 03/09/19 0518 03/10/19 0445 03/10/19 1150  NA 149*   < >  --  151*   < >  --  146* 143 142 144 140 139  K 3.5   < >  --  4.7   < >  --  4.3 4.0 3.8 3.8 3.9 4.0  CL 107  --   --  117*  --   --  113* 109 109 108 107  --   CO2 25  --   --  23  --   --  24 24 23 22  20*  --   GLUCOSE 230*  --   --  220*  --   --  260* 267* 182* 164* 360*  --   BUN 79*  --   --  92*  --   --  100* 91* 97* 93* 102*  --   CREATININE 3.23*  --   --  3.26*  --   --  3.21* 2.94* 2.90* 2.99* 3.07*  --   CALCIUM 7.6*  --   --  7.3*  --   --  7.7* 8.2* 8.3* 8.6* 8.2*  --   MG 2.1  --  1.9 1.9  --  2.6*  --   --   --   --   --   --   PHOS  --   --  4.3 3.5  --  3.7  --   --   --   --   --   --    < > = values in this interval not displayed.   GFR: Estimated Creatinine Clearance: 18.8 mL/min (A) (by C-G formula based on SCr of 3.07 mg/dL (H)). Recent Labs  Lab 03/05/19 0408  03/07/19 0425 03/08/19 0400 03/09/19 0518 03/10/19 0445  PROCALCITON 0.94  --   --   --   --  2.45  WBC 21.1*   < > 38.4* 35.7* 43.4*  44.4*   < > = values in this interval not displayed.    Liver Function Tests: Recent Labs  Lab 03/05/19 0408 03/05/19 0700 03/06/19 0430  03/07/19 0425 03/08/19 0400  AST 65* 51* 44* 118* 35  ALT 53* 52* 44 80* 65*  ALKPHOS 161* 174* 154* 226* 192*  BILITOT 0.4 0.6 0.3 0.4 0.6  PROT 5.4* 6.1* 5.9* 6.8 6.2*  ALBUMIN 2.4* 2.6* 2.5* 2.8* 2.6*   No results for input(s): LIPASE, AMYLASE in the last 168 hours. No results for input(s): AMMONIA in the last 168 hours.  ABG    Component Value Date/Time   PHART 7.422 03/10/2019 1150   PCO2ART 29.7 (L) 03/10/2019 1150   PO2ART 53.0 (L) 03/10/2019 1150   HCO3 19.3 (L) 03/10/2019 1150   TCO2 20 (L) 03/10/2019 1150   ACIDBASEDEF 4.0 (H) 03/10/2019 1150   O2SAT 88.0 03/10/2019 1150     Coagulation Profile: No results for input(s): INR, PROTIME in the last 168 hours.  Cardiac Enzymes: Recent Labs  Lab 03/04/19 1558  CKTOTAL 374*    HbA1C: Hgb A1c MFr Bld  Date/Time Value Ref Range Status  02/27/2019 02:15 AM 9.3 (H) 4.8 - 5.6 % Final    Comment:    (NOTE) Pre diabetes:          5.7%-6.4% Diabetes:              >6.4% Glycemic control for   <7.0% adults with diabetes   08/05/2010 05:10 AM (H) <5.7 % Final   8.0 (NOTE)                                                                       According to the ADA Clinical Practice Recommendations for 2011, when HbA1c is used as a screening test:   >=6.5%   Diagnostic of Diabetes Mellitus           (if abnormal result  is confirmed)  5.7-6.4%   Increased risk of developing Diabetes Mellitus  References:Diagnosis and Classification of Diabetes Mellitus,Diabetes TWSF,6812,75(TZGYF 1):S62-S69 and Standards of Medical Care in         Diabetes - 2011,Diabetes Care,2011,34  (Suppl 1):S11-S61.    CBG: Recent Labs  Lab 03/09/19 2001 03/09/19 2341 03/10/19 0350 03/10/19 0755 03/10/19 1125  GLUCAP 269* 333* 309* 349* 275*     She remains critically with severe encephalopathy and  multiple organ system failures which require database monitoring and discussion with a large multi-disciplinary team caring for her.  Critical care time: 31 minutes    Roselie Awkward, MD [place] [facility] Pager: [phone] Cell: [phone] If no response, call [phone]

## 2019-03-10 NOTE — Progress Notes (Signed)
PROGRESS NOTE  Nichole Franklin VOP:[id number] DOB: 01/03/1951 DOA: 02/21/2019  PCP: Elwyn Reach, MD  Brief History/Interval Summary: Patient is a 68 y.o. female with PMHx of DM-2, HTN, dyslipidemia, CKD stage IV who presented to the hospital initially for evaluation of weakness, she was found to have fever and acute kidney injury.  She was subsequently admitted to [facility] her renal function stabilized-she was transferred to [facility] course since admission has been complicated by encephalopathy, angioedema following initiation of Remdesivir-and respiratory failure requiring intubation on 7/4.  Reason for Visit: Acute respiratory disease due to COVID-19  Consultants: Pulmonology  Procedures:  Intubation 7/4  Central line left IJ 7/4  Antibiotics: Anti-infectives (From admission, onward)   Start     Dose/Rate Route Frequency Ordered Stop   03/07/19 1000  ceFEPIme (MAXIPIME) 2 g in sodium chloride 0.9 % 100 mL IVPB     2 g 200 mL/hr over 30 Minutes Intravenous Every 24 hours 03/06/19 1434     03/04/19 2200  ceFEPIme (MAXIPIME) 1 g in sodium chloride 0.9 % 100 mL IVPB  Status:  Discontinued     1 g 200 mL/hr over 30 Minutes Intravenous Every 12 hours 03/04/19 1256 03/06/19 1434   03/04/19 1400  remdesivir 100 mg in sodium chloride 0.9 % 250 mL IVPB  Status:  Discontinued     100 mg 500 mL/hr over 30 Minutes Intravenous Every 24 hours 03/03/19 1305 03/04/19 0936   03/04/19 1200  linezolid (ZYVOX) IVPB 600 mg  Status:  Discontinued     600 mg 300 mL/hr over 60 Minutes Intravenous Every 12 hours 03/04/19 1141 03/05/19 1100   03/04/19 0900  linezolid (ZYVOX) IVPB 600 mg  Status:  Discontinued     600 mg 300 mL/hr over 60 Minutes Intravenous Every 12 hours 03/04/19 0759 03/04/19 1141   03/04/19 0800  ceFEPIme (MAXIPIME) 2 g in sodium chloride 0.9 % 100 mL IVPB  Status:  Discontinued     2 g 200 mL/hr over 30 Minutes Intravenous Every 12 hours  03/04/19 0706 03/04/19 1256   03/03/19 1400  remdesivir 200 mg in sodium chloride 0.9 % 250 mL IVPB     200 mg 500 mL/hr over 30 Minutes Intravenous Once 03/03/19 1305 03/03/19 1500   02/27/19 2000  azithromycin (ZITHROMAX) 500 mg in sodium chloride 0.9 % 250 mL IVPB  Status:  Discontinued     500 mg 250 mL/hr over 60 Minutes Intravenous Every 24 hours 02/27/19 0428 03/04/19 1211   02/27/19 0430  cefTRIAXone (ROCEPHIN) 1 g in sodium chloride 0.9 % 100 mL IVPB  Status:  Discontinued     1 g 200 mL/hr over 30 Minutes Intravenous Every 24 hours 02/27/19 0428 03/04/19 0706       Subjective/Interval History: Patient seems to be doing well postextubation day before yesterday.  However this morning she is not responding as much as she was yesterday.  She does have her eyes open.  Does not follow commands.  She got a dose of morphine at around 6:00 this morning.  Also received trazodone earlier as well.   Assessment/Plan:  Acute Hypoxic Resp. Failure due to Acute Covid 19 Viral Illness        Component Value Date/Time   PHART 7.361 03/05/2019 0836   PCO2ART 40.7 03/05/2019 0836   PO2ART 87.0 03/05/2019 0836   HCO3 23.0 03/05/2019 0836   TCO2 24 03/05/2019 0836   ACIDBASEDEF 2.0 03/05/2019 0836   O2SAT  96.0 03/05/2019 0836    COVID-19 Labs  Recent Labs    03/08/19 0400 03/09/19 0518 03/10/19 0445  DDIMER 8.04* 12.42* 8.87*  FERRITIN 154  --   --   CRP <0.8  --   --     Lab Results  Component Value Date   SARSCOV2NAA POSITIVE (A) 01/30/2019     Fever: Has not had any fever in the last 24 hours. Oxygen requirements: Saturating in the 90s on 4 L of oxygen by nasal cannula.    Antibiotics: On cefepime.  Looks like today will be day 7 Remdesivir: Patient developed angioedema to Remdesivir Steroids: She remains on Solu-Medrol. Diuretics: She received Lasix yesterday. Actemra: Did not receive Convalescent Plasma: Did not receive Vitamin C and Zinc: Continue DVT  Prophylaxis: On heparin 7500 units subcutaneously every 8 hours  Patient was intubated due to angioedema thought to be secondary to Remdesivir.  Angioedema resolved.  Patient was successfully extubated on 7/8.  She did have some respiratory issues same evening with a mild stridor and wheezing.  She was given inhalers and high dose of steroids.  Seems to have stabilized.  Chest x-ray done yesterday showed stable findings.  Patient was given furosemide by pulmonology.  D-dimer has improved today to 8.87 from 12.42.  WBC remains elevated.  Could be due to steroids. Her procalcitonin level is noted to be elevated at 2.45.  It was as high as 4.54 during the earlier part of this hospitalization.  She is still on cefepime which we will continue for now.  Lung examination has significantly improved.  She is down to 4 L of oxygen by nasal cannula and could be weaned down further.  Hold off on further doses of Lasix.  Acute metabolic encephalopathy She had encephalopathy earlier during the course of the hospitalization thought to be due to hypoxemia and other acute metabolic derangement.  CT scan done on 7/4 did not show any acute findings.  Noted to be somewhat lethargic and not as responsive today compared to yesterday.  Could be due to medication effects.  Her BUN is also noted to be elevated at 102 which could also be contributing.  We will leave in the ICU for today and see how she responds as the day goes on.  Physical therapist to also evaluate patient.  Do not see any overt focal deficits at this time.  Elevated d-dimer Patient was empirically started on IV heparin on 7/4 due to concern for VTE.  Due to drop in hemoglobin this was discontinued.  Lower extremity Doppler study was negative for DVT.  Clinically there was low suspicion for VTE.  D-dimer had continued to rise but noted to be better today.  Continue high-dose subcutaneous heparin for now.    Leukocytosis WBC remains significantly elevated.  She  is afebrile.  She has not had any diarrhea.  Procalcitonin noted to be 2.45.  She remains on cefepime. Leukocytosis could be due to steroids.  Continue to trend for now.  Angioedema Most likely secondary to Remdesivir.  Tongue swelling has improved.  Acute kidney injury on chronic kidney disease stage IV Baseline creatinine around 1.6-2.  Presented with a BUN of 81 and a creatinine of 7.09.  Renal function had improved.  Slight increase in creatinine over the last few days is most likely due to furosemide.  Hold furosemide today.  Hypernatremia Resolved with free water.  Monitor electrolytes closely.  Diabetes mellitus type 2 with uncontrolled hyperglycemia No longer on insulin infusion.  Patient is on Lantus insulin and SSI.  HbA1c 9.3.  CBGs noted to be extremely elevated yesterday most likely due to high-dose steroids.  Will increase the dose of her Lantus today.   Essential hypertension Blood pressure has been better controlled over the last 12 hours.  Continue to monitor.  Continue amlodipine and as needed labetalol for now.    Normocytic anemia  Hemoglobin has been fluctuating.  No evidence for overt bleeding. She was transfused 1 unit of PRBC on 7/6.  Continue to monitor.    Nutrition Being fed through a feeding tube.  Speech therapy to evaluate.   DVT Prophylaxis: Subcutaneous heparin PUD Prophylaxis: Protonix Code Status: Full code Family Communication: Pulmonology has been discussing with patient's family. Disposition Plan: PT and OT to evaluate.  Speech therapy to see.  Remain in ICU for today.   Medications:  Scheduled: . sodium chloride   Intravenous Once  . amLODipine  10 mg Per Tube Daily  . chlorhexidine  15 mL Mouth Rinse BID  . Chlorhexidine Gluconate Cloth  6 each Topical Q0600  . cholecalciferol  1,000 Units Per Tube Daily  . feeding supplement (PRO-STAT SUGAR FREE 64)  30 mL Oral BID  . ferrous sulfate  300 mg Per Tube BID WC  . free water  300 mL Per Tube  Q4H  . heparin injection (subcutaneous)  7,500 Units Subcutaneous Q8H  . insulin aspart  0-20 Units Subcutaneous Q4H  . insulin glargine  35 Units Subcutaneous BID  . ipratropium  2 puff Inhalation Q6H  . labetalol  10 mg Intravenous Q8H  . levalbuterol  2 puff Inhalation Q6H  . mouth rinse  15 mL Mouth Rinse q12n4p  . methylPREDNISolone (SOLU-MEDROL) injection  40 mg Intravenous Q12H  . pantoprazole sodium  40 mg Per Tube Daily  . vitamin C  1,000 mg Per Tube Daily  . zinc sulfate  220 mg Per Tube Daily   Continuous: . sodium chloride 10 mL/hr at 03/10/19 0600  . ceFEPime (MAXIPIME) IV Stopped (03/09/19 1000)  . dexmedetomidine (PRECEDEX) IV infusion Stopped (03/08/19 1443)  . feeding supplement (JEVITY 1.2 CAL) 1,000 mL (03/09/19 1400)   [id number] **OR** acetaminophen, dextrose, EPINEPHrine, fentaNYL (SUBLIMAZE) injection, guaiFENesin-dextromethorphan, hydrALAZINE, Ipratropium-Albuterol, lip balm, midazolam, morphine injection, ondansetron **OR** ondansetron (ZOFRAN) IV, traZODone   Objective:  Vital Signs  Vitals:   03/10/19 0411 03/10/19 0500 03/10/19 0600 03/10/19 0700  BP:  (!) 146/67 129/64 (!) 146/65  Pulse:  (!) 103 90 95  Resp:  (!) 24 (!) 21 (!) 22  Temp:      TempSrc:      SpO2:  98% 99% 98%  Weight: 80.9 kg     Height:        Intake/Output Summary (Last 24 hours) at 03/10/2019 0801 Last data filed at 03/10/2019 0600 Gross per 24 hour  Intake 2106.2 ml  Output 2665 ml  Net -558.8 ml   Filed Weights   03/08/19 0448 03/09/19 0410 03/10/19 0411  Weight: 80.2 kg 81.3 kg 80.9 kg    General appearance:  she is awake.  Somewhat lethargic.  Does not follow commands today as she was yesterday. Resp: Improved air entry.  Not as much upper airway wheezing as was heard yesterday.  Crackles at the bases.   Cardio: S1-S2 is normal regular.  No S3-S4.  No rubs murmurs or bruit GI: Abdomen is soft.  Nontender nondistended.  Bowel sounds are present normal.  No  masses organomegaly Extremities: No edema.  Her left lower extremity is noted to be cooler compared to right.  No color changes noted.  Capillary refill noted. Neurologic: No obvious facial asymmetry.  No obvious focal deficits.      Lab Results:  Data Reviewed: I have personally reviewed following labs and imaging studies  CBC: Recent Labs  Lab 03/04/19 0131  03/06/19 1750 03/07/19 0425 03/08/19 0400 03/09/19 0518 03/10/19 0445  WBC 23.2*   < > 37.0* 38.4* 35.7* 43.4* 44.4*  NEUTROABS 17.7*  --   --   --   --   --  38.4*  HGB 8.1*   < > 8.5* 8.8* 7.9* 8.5* 7.7*  HCT 26.2*   < > 27.4* 29.1* 25.4* 27.1* 24.6*  MCV 87.0   < > 91.9 90.1 88.8 88.9 90.4  PLT 256   < > 188 210 195 195 181   < > = values in this interval not displayed.    Basic Metabolic Panel: Recent Labs  Lab 03/04/19 0131  03/04/19 1558 03/05/19 0408  03/05/19 1700 03/06/19 0430 03/07/19 0425 03/08/19 0400 03/09/19 0518 03/10/19 0445  NA 149*   < >  --  151*   < >  --  146* 143 142 144 140  K 3.5   < >  --  4.7   < >  --  4.3 4.0 3.8 3.8 3.9  CL 107  --   --  117*  --   --  113* 109 109 108 107  CO2 25  --   --  23  --   --  24 24 23 22  20*  GLUCOSE 230*  --   --  220*  --   --  260* 267* 182* 164* 360*  BUN 79*  --   --  92*  --   --  100* 91* 97* 93* 102*  CREATININE 3.23*  --   --  3.26*  --   --  3.21* 2.94* 2.90* 2.99* 3.07*  CALCIUM 7.6*  --   --  7.3*  --   --  7.7* 8.2* 8.3* 8.6* 8.2*  MG 2.1  --  1.9 1.9  --  2.6*  --   --   --   --   --   PHOS  --   --  4.3 3.5  --  3.7  --   --   --   --   --    < > = values in this interval not displayed.    GFR: Estimated Creatinine Clearance: 18.8 mL/min (A) (by C-G formula based on SCr of 3.07 mg/dL (H)).  Liver Function Tests: Recent Labs  Lab 03/05/19 0408 03/05/19 0700 03/06/19 0430 03/07/19 0425 03/08/19 0400  AST 65* 51* 44* 118* 35  ALT 53* 52* 44 80* 65*  ALKPHOS 161* 174* 154* 226* 192*  BILITOT 0.4 0.6 0.3 0.4 0.6  PROT 5.4* 6.1*  5.9* 6.8 6.2*  ALBUMIN 2.4* 2.6* 2.5* 2.8* 2.6*    Cardiac Enzymes: Recent Labs  Lab 03/04/19 1558  CKTOTAL 374*     CBG: Recent Labs  Lab 03/09/19 1225 03/09/19 1703 03/09/19 2001 03/09/19 2341 03/10/19 0350  GLUCAP 163* 196* 269* 333* 309*     Anemia Panel: Recent Labs    03/08/19 0400  FERRITIN 154    Recent Results (from the past 240 hour(s))  Culture, respiratory (non-expectorated)     Status: None   Collection Time: 03/04/19 11:41 AM   Specimen: Tracheal Aspirate; Respiratory  Result  Value Ref Range Status   Specimen Description   Final    TRACHEAL ASPIRATE Performed at [facility] [address], [place], [place] [address]    Special Requests   Final    NONE Performed at [facility], [place] [address] [place], [place] [address]    Gram Stain   Final    RARE WBC PRESENT, PREDOMINANTLY PMN MODERATE SQUAMOUS EPITHELIAL CELLS PRESENT ABUNDANT GRAM POSITIVE COCCI    Culture   Final    MODERATE Consistent with normal respiratory flora. Performed at [facility], [place] [address], [place], [place] [address]    Report Status 03/07/2019 FINAL  Final  MRSA PCR Screening     Status: None   Collection Time: 03/04/19  7:08 PM   Specimen: Nasal Mucosa; Nasopharyngeal  Result Value Ref Range Status   MRSA by PCR NEGATIVE NEGATIVE Final    Comment:        The GeneXpert MRSA Assay (FDA approved for NASAL specimens only), is one component of a comprehensive MRSA colonization surveillance program. It is not intended to diagnose MRSA infection nor to guide or monitor treatment for MRSA infections. Performed at [facility], [place] [address], [place], [place] [address]       Radiology Studies: Dg Chest Port 1 View  Result Date: 03/09/2019 CLINICAL DATA:  Pneumonia EXAM: PORTABLE CHEST 1 VIEW COMPARISON:  03/08/2019 FINDINGS: Interval removal of the endotracheal tube. Feeding tube in satisfactory  position. Left jugular central venous catheter in unchanged position. Peripheral hazy airspace disease in the left lung similar in appearance to the prior examination. No pleural effusion or pneumothorax. Stable cardiomediastinal silhouette. No aggressive osseous lesion. IMPRESSION: Stable peripheral hazy area airspace disease in the left lung likely secondary to an infectious etiology. Electronically Signed   By: Kathreen Devoid   On: 03/09/2019 10:19       LOS: 12 days   [facility] Pager on [email or web address]  03/10/2019, 8:01 AM

## 2019-03-10 NOTE — Evaluation (Addendum)
Physical Therapy Evaluation Patient Details Name: Nichole Franklin MRN: [id number] DOB: Oct 23, 1950 Today's Date: 03/10/2019   History of Present Illness  68 y.o. female with history of diabetes mellitus type 2, hypertension, DDD,  hyperlipidemia has been feeling weak last 3 to 4 days.  Patient had contact with her sister was positive for COVID-19.  On hospital day 6 she developed worsening confusion and tongue swelling felt to be a reaction to remdesivir; then was transferred to the ICU emergently for intubation. Extubated 7/8; cortrak g-tube 7/8; head CT 7/4 negative for acute changes;     Clinical Impression  Pt admitted with above diagnosis. RN reports her mentation is worse this morning, however MD suspects due to sedating meds she got overnight. Awake, however not following commands (even with sit EOB), however did respond to name called (with head turn), withdrew LLE when foot "tickledf", and abdominals briefly kicked in when sitting and began to lean posteriorly. Pt currently with functional limitations due to the deficits listed below (see PT Problem List).  Pt will benefit from skilled PT to increase their independence and safety with mobility to allow discharge to the venue listed below.    Addendum-Pt on 2L [place] throughout with sats 95-97%, RR 23-30, HR 99 throughout, and BP increased with sitting EOB appropriately. Patient noted to "belly breath" at rest and with sitting EOB. Returned to baseline RR of 23 within 2 minutes of return to supine.      Follow Up Recommendations SNF;Supervision/Assistance - 24 hour(based on anticipated slow progress and ?family support)    Equipment Recommendations  None recommended by PT    Recommendations for Other Services       Precautions / Restrictions Precautions Precautions: Fall Restrictions Weight Bearing Restrictions: No      Mobility  Bed Mobility Overal bed mobility: Needs Assistance Bed Mobility: Supine to Sit;Sit to Supine      Supine to sit: Total assist;+2 for physical assistance;+2 for safety/equipment;HOB elevated Sit to supine: Total assist;+2 for physical assistance;+2 for safety/equipment   General bed mobility comments: eyes open throughout session  Transfers                 General transfer comment: unable to attempt due to decr arousal, not following commands  Ambulation/Gait                Stairs            Wheelchair Mobility    Modified Rankin (Stroke Patients Only)       Balance Overall balance assessment: Needs assistance Sitting-balance support: Feet supported;Bilateral upper extremity supported(hands resting on bed; pt not actively using for support) Sitting balance-Leahy Scale: Zero Sitting balance - Comments: sat EOB x 5 minutes with slight incr alertness; VSS                                     Pertinent Vitals/Pain Pain Assessment: Faces Faces Pain Scale: No hurt    Home Living Family/patient expects to be discharged to:: Private residence                      Prior Function                 Hand Dominance        Extremity/Trunk Assessment   Upper Extremity Assessment Upper Extremity Assessment: Defer to OT evaluation    Lower Extremity Assessment Lower  Extremity Assessment: RLE deficits/detail;LLE deficits/detail RLE Deficits / Details: PROM WFLhip, knee, ankle; no AROM noted LLE Deficits / Details: PROM WFL hip, knee, ankle; actively withdrew to tickling foot    Cervical / Trunk Assessment Cervical / Trunk Assessment: Other exceptions Cervical / Trunk Exceptions: obese; at EOB when support to her trunk decreased and she began to lean backwards, her abdominals contracted but not strong enough to right herself  Communication      Cognition Arousal/Alertness: Lethargic;Suspect due to medications(RN reports sedative meds given last night) Behavior During Therapy: Flat affect Overall Cognitive Status: Difficult  to assess                                        General Comments General comments (skin integrity, edema, etc.): Once supine, noted pt spontaneously turn her head to her left (? reason) and then turned to look at PT on her right when her name was called    Exercises     Assessment/Plan    PT Assessment Patient needs continued PT services  PT Problem List Decreased strength;Decreased activity tolerance;Decreased balance;Decreased mobility;Decreased cognition;Decreased knowledge of use of DME;Cardiopulmonary status limiting activity;Obesity       PT Treatment Interventions DME instruction;Gait training;Functional mobility training;Therapeutic activities;Therapeutic exercise;Balance training;Neuromuscular re-education;Cognitive remediation;Patient/family education    PT Goals (Current goals can be found in the Care Plan section)  Acute Rehab PT Goals Patient Stated Goal: unable to state PT Goal Formulation: Patient unable to participate in goal setting Time For Goal Achievement: 03/24/19 Potential to Achieve Goals: Fair    Frequency Min 2X/week   Barriers to discharge        Co-evaluation               AM-PAC PT "6 Clicks" Mobility  Outcome Measure Help needed turning from your back to your side while in a flat bed without using bedrails?: Total Help needed moving from lying on your back to sitting on the side of a flat bed without using bedrails?: Total Help needed moving to and from a bed to a chair (including a wheelchair)?: Total Help needed standing up from a chair using your arms (e.g., wheelchair or bedside chair)?: Total Help needed to walk in hospital room?: Total Help needed climbing 3-5 steps with a railing? : Total 6 Click Score: 6    End of Session Equipment Utilized During Treatment: Oxygen(on 2L [place]) Activity Tolerance: Patient limited by fatigue(incr RR) Patient left: in bed;with nursing/sitter in room Nurse Communication: Mobility  status;Other (comment)(level of arousal) PT Visit Diagnosis: Muscle weakness (generalized) (M62.81)    Time: 4193-7902 PT Time Calculation (min) (ACUTE ONLY): 24 min   Charges:   PT Evaluation $PT Eval Moderate Complexity: 1 Mod            [employer], PT 03/10/2019, 10:22 AM

## 2019-03-10 NOTE — Progress Notes (Signed)
[facility]  Worsening respiratory status with inability to clear secretions CT head without acute change Family OK with intubation, I discussed with [name] her daughter and explained that the likelihood of her needing a prolonged intubation and likely tracheostomy is high.  She voiced understanding and is willing to proceed.  Roselie Awkward, MD [facility] Pager: [phone] Cell: [phone] If no response, call [phone]

## 2019-03-10 NOTE — Progress Notes (Addendum)
Mental status unchanged. Patient in respiratory distress. Dr. Lake Bells spoke to family and they verbalized understanding. Dr. Lake Bells, Phillis Knack RT, [place]oni RT, Elyn Peers RN, and Pepper Pike B RN at the bedside during the rapid sequence intubation. 2 versed, 40 etomidate, 40mg  of rocuronium given during RSI. Patient intubated with a 7.5ETT 25 at the lip. Chest Xray ordered. Blood pressure and heart rate stable. Patient started on PRVC. Settings charted by RT.

## 2019-03-10 NOTE — Progress Notes (Signed)
Patient not following commands and intermittently responding to staff. This is a mental status change from yesterday.   Left extremities colder than the right with weaker pulses.   Notified [place] Pink, MD and [place] during rounds. Will continue to monitor status closely d/t patient receiving Trazodone and Morphine overnight.

## 2019-03-11 ENCOUNTER — Inpatient Hospital Stay (HOSPITAL_COMMUNITY): Payer: Medicare HMO

## 2019-03-11 ENCOUNTER — Inpatient Hospital Stay: Payer: Self-pay

## 2019-03-11 LAB — POCT I-STAT 7, (LYTES, BLD GAS, ICA,H+H)
Acid-base deficit: 5 mmol/L — ABNORMAL HIGH (ref 0.0–2.0)
Bicarbonate: 20.1 mmol/L (ref 20.0–28.0)
Calcium, Ion: 1.21 mmol/L (ref 1.15–1.40)
HCT: 18 % — ABNORMAL LOW (ref 36.0–46.0)
Hemoglobin: 6.1 g/dL — CL (ref 12.0–15.0)
O2 Saturation: 92 %
Patient temperature: 100.5
Potassium: 4.5 mmol/L (ref 3.5–5.1)
Sodium: 139 mmol/L (ref 135–145)
TCO2: 21 mmol/L — ABNORMAL LOW (ref 22–32)
pCO2 arterial: 39.1 mmHg (ref 32.0–48.0)
pH, Arterial: 7.325 — ABNORMAL LOW (ref 7.350–7.450)
pO2, Arterial: 73 mmHg — ABNORMAL LOW (ref 83.0–108.0)

## 2019-03-11 LAB — RENAL FUNCTION PANEL
Albumin: 2 g/dL — ABNORMAL LOW (ref 3.5–5.0)
Anion gap: 12 (ref 5–15)
BUN: 110 mg/dL — ABNORMAL HIGH (ref 8–23)
CO2: 22 mmol/L (ref 22–32)
Calcium: 8 mg/dL — ABNORMAL LOW (ref 8.9–10.3)
Chloride: 106 mmol/L (ref 98–111)
Creatinine, Ser: 4.14 mg/dL — ABNORMAL HIGH (ref 0.44–1.00)
GFR calc Af Amer: 12 mL/min — ABNORMAL LOW (ref >60)
GFR calc non Af Amer: 10 mL/min — ABNORMAL LOW (ref >60)
Glucose, Bld: 155 mg/dL — ABNORMAL HIGH (ref 70–99)
Phosphorus: 6.4 mg/dL — ABNORMAL HIGH (ref 2.5–4.6)
Potassium: 4.7 mmol/L (ref 3.5–5.1)
Sodium: 140 mmol/L (ref 135–145)

## 2019-03-11 LAB — CBC WITH DIFFERENTIAL/PLATELET
Abs Immature Granulocytes: 0.94 10*3/uL — ABNORMAL HIGH (ref 0.00–0.07)
Basophils Absolute: 0.1 10*3/uL (ref 0.0–0.1)
Basophils Relative: 0 %
Eosinophils Absolute: 0 10*3/uL (ref 0.0–0.5)
Eosinophils Relative: 0 %
HCT: 22.2 % — ABNORMAL LOW (ref 36.0–46.0)
Hemoglobin: 6.8 g/dL — CL (ref 12.0–15.0)
Immature Granulocytes: 2 %
Lymphocytes Relative: 3 %
Lymphs Abs: 1.1 10*3/uL (ref 0.7–4.0)
MCH: 28.3 pg (ref 26.0–34.0)
MCHC: 30.6 g/dL (ref 30.0–36.0)
MCV: 92.5 fL (ref 80.0–100.0)
Monocytes Absolute: 4 10*3/uL — ABNORMAL HIGH (ref 0.1–1.0)
Monocytes Relative: 10 %
Neutro Abs: 32.9 10*3/uL — ABNORMAL HIGH (ref 1.7–7.7)
Neutrophils Relative %: 85 %
Platelets: 156 10*3/uL (ref 150–400)
RBC: 2.4 MIL/uL — ABNORMAL LOW (ref 3.87–5.11)
RDW: 16.8 % — ABNORMAL HIGH (ref 11.5–15.5)
WBC: 39 10*3/uL — ABNORMAL HIGH (ref 4.0–10.5)
nRBC: 0.2 % (ref 0.0–0.2)

## 2019-03-11 LAB — HEPATIC FUNCTION PANEL
ALT: 48 U/L — ABNORMAL HIGH (ref 0–44)
AST: 32 U/L (ref 15–41)
Albumin: 2.1 g/dL — ABNORMAL LOW (ref 3.5–5.0)
Alkaline Phosphatase: 146 U/L — ABNORMAL HIGH (ref 38–126)
Bilirubin, Direct: 0.1 mg/dL (ref 0.0–0.2)
Indirect Bilirubin: 0.2 mg/dL — ABNORMAL LOW (ref 0.3–0.9)
Total Bilirubin: 0.3 mg/dL (ref 0.3–1.2)
Total Protein: 6 g/dL — ABNORMAL LOW (ref 6.5–8.1)

## 2019-03-11 LAB — GLUCOSE, CAPILLARY
Glucose-Capillary: 141 mg/dL — ABNORMAL HIGH (ref 70–99)
Glucose-Capillary: 150 mg/dL — ABNORMAL HIGH (ref 70–99)
Glucose-Capillary: 152 mg/dL — ABNORMAL HIGH (ref 70–99)
Glucose-Capillary: 161 mg/dL — ABNORMAL HIGH (ref 70–99)
Glucose-Capillary: 161 mg/dL — ABNORMAL HIGH (ref 70–99)
Glucose-Capillary: 214 mg/dL — ABNORMAL HIGH (ref 70–99)

## 2019-03-11 LAB — D-DIMER, QUANTITATIVE: D-Dimer, Quant: 6.95 ug/mL-FEU — ABNORMAL HIGH (ref 0.00–0.50)

## 2019-03-11 LAB — CBC
HCT: 25.4 % — ABNORMAL LOW (ref 36.0–46.0)
Hemoglobin: 8.1 g/dL — ABNORMAL LOW (ref 12.0–15.0)
MCH: 29.1 pg (ref 26.0–34.0)
MCHC: 31.9 g/dL (ref 30.0–36.0)
MCV: 91.4 fL (ref 80.0–100.0)
Platelets: 139 10*3/uL — ABNORMAL LOW (ref 150–400)
RBC: 2.78 MIL/uL — ABNORMAL LOW (ref 3.87–5.11)
RDW: 15.9 % — ABNORMAL HIGH (ref 11.5–15.5)
WBC: 39.6 10*3/uL — ABNORMAL HIGH (ref 4.0–10.5)
nRBC: 0.4 % — ABNORMAL HIGH (ref 0.0–0.2)

## 2019-03-11 LAB — BASIC METABOLIC PANEL
Anion gap: 13 (ref 5–15)
BUN: 141 mg/dL — ABNORMAL HIGH (ref 8–23)
CO2: 22 mmol/L (ref 22–32)
Calcium: 8.2 mg/dL — ABNORMAL LOW (ref 8.9–10.3)
Chloride: 108 mmol/L (ref 98–111)
Creatinine, Ser: 3.75 mg/dL — ABNORMAL HIGH (ref 0.44–1.00)
GFR calc Af Amer: 14 mL/min — ABNORMAL LOW (ref >60)
GFR calc non Af Amer: 12 mL/min — ABNORMAL LOW (ref >60)
Glucose, Bld: 153 mg/dL — ABNORMAL HIGH (ref 70–99)
Potassium: 3.8 mmol/L (ref 3.5–5.1)
Sodium: 143 mmol/L (ref 135–145)

## 2019-03-11 LAB — AMMONIA: Ammonia: 45 umol/L — ABNORMAL HIGH (ref 9–35)

## 2019-03-11 LAB — PREPARE RBC (CROSSMATCH)

## 2019-03-11 LAB — PROCALCITONIN: Procalcitonin: 2.65 ng/mL

## 2019-03-11 MED ORDER — PRISMASOL BGK 4/2.5 32-4-2.5 MEQ/L IV SOLN
INTRAVENOUS | Status: DC
  Administered 2019-03-11 – 2019-03-17 (×40): via INTRAVENOUS_CENTRAL

## 2019-03-11 MED ORDER — ALTEPLASE 2 MG IJ SOLR
2.0000 mg | Freq: Once | INTRAMUSCULAR | Status: DC | PRN
  Filled 2019-03-11: qty 2

## 2019-03-11 MED ORDER — HEPARIN BOLUS VIA INFUSION (CRRT)
1000.0000 [IU] | INTRAVENOUS | Status: DC | PRN
  Filled 2019-03-11: qty 1000

## 2019-03-11 MED ORDER — NOREPINEPHRINE 16 MG/250ML-% IV SOLN
0.0000 ug/min | INTRAVENOUS | Status: DC
  Administered 2019-03-11 – 2019-03-14 (×2): 2 ug/min via INTRAVENOUS
  Administered 2019-03-15: 12.053 ug/min via INTRAVENOUS
  Administered 2019-03-15: 5 ug/min via INTRAVENOUS
  Administered 2019-03-16: 18 ug/min via INTRAVENOUS
  Administered 2019-03-16: 15 ug/min via INTRAVENOUS
  Administered 2019-03-17: 24 ug/min via INTRAVENOUS
  Filled 2019-03-11 (×4): qty 250

## 2019-03-11 MED ORDER — PRISMASOL BGK 4/2.5 32-4-2.5 MEQ/L REPLACEMENT SOLN
Status: DC
  Administered 2019-03-11 – 2019-03-17 (×10): via INTRAVENOUS_CENTRAL

## 2019-03-11 MED ORDER — SODIUM CHLORIDE 0.9% FLUSH: 10.0000 mL | INTRAVENOUS | Status: DC | PRN

## 2019-03-11 MED ORDER — POLYETHYLENE GLYCOL 3350 17 G PO PACK: 17.0000 g | PACK | Freq: Every day | ORAL | Status: DC

## 2019-03-11 MED ORDER — FENTANYL 2500MCG IN NS 250ML (10MCG/ML) PREMIX INFUSION
0.0000 ug/h | INTRAVENOUS | Status: DC
  Administered 2019-03-11: 14:00:00 100 ug/h via INTRAVENOUS
  Administered 2019-03-12 – 2019-03-13 (×2): 200 ug/h via INTRAVENOUS
  Administered 2019-03-14: 21:00:00 75 ug/h via INTRAVENOUS
  Administered 2019-03-15: 50 ug/h via INTRAVENOUS
  Administered 2019-03-16 (×2): 100 ug/h via INTRAVENOUS
  Administered 2019-03-17: 22:00:00 150 ug/h via INTRAVENOUS
  Filled 2019-03-11 (×6): qty 250

## 2019-03-11 MED ORDER — SODIUM CHLORIDE 0.9% FLUSH
10.0000 mL | Freq: Two times a day (BID) | INTRAVENOUS | Status: DC
  Administered 2019-03-11: 21:00:00 40 mL
  Administered 2019-03-12 – 2019-03-17 (×8): 10 mL

## 2019-03-11 MED ORDER — POLYETHYLENE GLYCOL 3350 17 G PO PACK
17.0000 g | PACK | Freq: Every day | ORAL | Status: DC
  Administered 2019-03-11 – 2019-03-12 (×2): 17 g
  Filled 2019-03-11 (×2): qty 1

## 2019-03-11 MED ORDER — PRISMASOL BGK 4/2.5 32-4-2.5 MEQ/L REPLACEMENT SOLN
Status: DC
  Administered 2019-03-11 – 2019-03-17 (×8): via INTRAVENOUS_CENTRAL

## 2019-03-11 MED ORDER — SODIUM CHLORIDE 0.9 % IV SOLN
500.0000 [IU]/h | INTRAVENOUS | Status: DC
  Administered 2019-03-11 – 2019-03-17 (×7): 500 [IU]/h via INTRAVENOUS_CENTRAL
  Filled 2019-03-11: qty 2
  Filled 2019-03-11: qty 10000
  Filled 2019-03-11: qty 2
  Filled 2019-03-11 (×2): qty 10000
  Filled 2019-03-11: qty 2
  Filled 2019-03-11: qty 10000
  Filled 2019-03-11: qty 2
  Filled 2019-03-11: qty 10000

## 2019-03-11 MED ORDER — SODIUM CHLORIDE 0.9% IV SOLUTION
Freq: Once | INTRAVENOUS | Status: AC
  Administered 2019-03-11: 17:00:00 via INTRAVENOUS

## 2019-03-11 MED ORDER — IPRATROPIUM-ALBUTEROL 0.5-2.5 (3) MG/3ML IN SOLN
3.0000 mL | Freq: Four times a day (QID) | RESPIRATORY_TRACT | Status: DC
  Administered 2019-03-11 – 2019-03-17 (×27): 3 mL via RESPIRATORY_TRACT
  Filled 2019-03-11 (×29): qty 3

## 2019-03-11 MED ORDER — SODIUM CHLORIDE 0.9 % IV SOLN
2.0000 g | Freq: Two times a day (BID) | INTRAVENOUS | Status: DC
  Administered 2019-03-11 – 2019-03-17 (×12): 2 g via INTRAVENOUS
  Filled 2019-03-11 (×12): qty 2

## 2019-03-11 MED ORDER — CHLORHEXIDINE GLUCONATE CLOTH 2 % EX PADS: 6.0000 | MEDICATED_PAD | Freq: Every day | CUTANEOUS | Status: DC

## 2019-03-11 MED ORDER — SENNA 8.6 MG PO TABS
2.0000 | ORAL_TABLET | Freq: Every day | ORAL | Status: DC
  Administered 2019-03-11 – 2019-03-17 (×6): 17.2 mg
  Filled 2019-03-11 (×6): qty 2

## 2019-03-11 MED ORDER — SODIUM CHLORIDE 0.9 % FOR CRRT
INTRAVENOUS_CENTRAL | Status: DC | PRN
  Administered 2019-03-11: 21:00:00 via INTRAVENOUS_CENTRAL
  Filled 2019-03-11 (×2): qty 1000

## 2019-03-11 MED ORDER — MIDAZOLAM HCL 2 MG/2ML IJ SOLN: 1.0000 mg | Freq: Once | INTRAMUSCULAR | Status: AC

## 2019-03-11 MED ORDER — HEPARIN SODIUM (PORCINE) 1000 UNIT/ML DIALYSIS
1000.0000 [IU] | INTRAMUSCULAR | Status: DC | PRN
  Administered 2019-03-11: 17:00:00 6000 [IU] via INTRAVENOUS_CENTRAL
  Administered 2019-03-17: 2400 [IU] via INTRAVENOUS_CENTRAL
  Filled 2019-03-11 (×4): qty 6

## 2019-03-11 MED ORDER — MIDAZOLAM HCL 2 MG/2ML IJ SOLN
2.0000 mg | Freq: Once | INTRAMUSCULAR | Status: AC
  Administered 2019-03-11: 2 mg via INTRAVENOUS
  Filled 2019-03-11: qty 2

## 2019-03-11 NOTE — Progress Notes (Signed)
Patient continues to have labored breathing despite increase in Fentanyl drip and PRN Versed administration. Notified McQuaid, MD. Orders for CBC and 2mg  of Versed.

## 2019-03-11 NOTE — Progress Notes (Addendum)
Hoffman notified need for CRRT heparin syringe set rate order, as [facility] does not currently run ACTs. Awaiting order.  AddendumWarren Lacy unsure how to dose. Nephro paged for further guidance.

## 2019-03-11 NOTE — Consult Note (Signed)
Renal Service Consult Note [place] 03/11/2019 Sol Blazing Requesting Physician:  Dr [place] Pink, Darnell Level.   Reason for Consult:  Renal failure HPI: The patient is a 68 y.o. year-old with hx of DM2 on insulin, HTN, HL, and CKD IV presented on 6/29 w/ feeling "weak" for 3- 4 days. Was in contact w/ a sister who had COVID-19.  Then had subjective fevers, chills, diarrhea and SOB, poor appetite. In ED was febrile, tachycardic and CXR showed bilat infiltrates.  Creat was 7 (baseline 2.6 in 2018). Pt was admitted and started on IV abx and IV steroids.   Creat improved to 2.9 w/ IVF"s from 7.0 on admission. NSAIDs' and ACEi home meds were held on admission. The last few days creat is rising up to 3.75 today w/ BUN 141.  UA not done, renal US showed echogenic kidney's w/o hydro.    Pt admitted 6.28 as above.  Moved to [facility] on 6/30 for AMS.  ON 7/2 has worsening oxygenation, started remdesivir on 7/3 and on 7/4 had severe tongue swelling and AMS and was intubated , fevers worsened.    Currently pt is on 30% fiO2, PEEP 5, BP's soft to normal, no pressors. Has not rec'd any IV contrast here.    IV abx  azithromycin 6/28 > 7/3  ceftriaxone  6/28 - 7/3  cefepime 7/4 > ...  linezolid 7/4 > 7/5  remdesivir 7/3 dc'd same day   ROS n/a   Past Medical History  Past Medical History:  Diagnosis Date  . Allergy    mild  . Anxiety   . Cancer (HCC)    cervical cancer  . Degenerative disc disease, lumbar   . Diabetes mellitus   . Gastroesophageal reflux   . Hyperlipidemia   . Hypertension   . Neuromuscular disorder ([place])    small hiatal hernia   Past Surgical History  Past Surgical History:  Procedure Laterality Date  . ABDOMINAL HYSTERECTOMY    . CHOLECYSTECTOMY     Family History  Family History  Problem Relation Age of Onset  . Breast cancer Sister   . Diabetes Sister        x 3  . Diabetes Brother   . Colon cancer Neg Hx   . Esophageal cancer Neg Hx    . Pancreatic cancer Neg Hx   . Stomach cancer Neg Hx   . Colon polyps Neg Hx   . Rectal cancer Neg Hx    Social History  reports that she has been smoking cigarettes. She has been smoking about 0.25 packs per day. She has never used smokeless tobacco. She reports that she does not drink alcohol or use drugs. Allergies  Allergies  Allergen Reactions  . Remdesivir Anaphylaxis   Home medications Prior to Admission medications   Medication Sig Start Date End Date Taking? Authorizing Provider  Dexlansoprazole (DEXILANT) 30 MG capsule Take 30 mg by mouth daily.   Yes [provider]  DULoxetine (CYMBALTA) 30 MG capsule Take 30 mg by mouth daily.   Yes [provider]  gabapentin (NEURONTIN) 300 MG capsule Take 300 mg by mouth 3 (three) times daily.    Yes [provider]  hydrochlorothiazide (HYDRODIURIL) 25 MG tablet Take 25 mg by mouth daily.   Yes [provider]  HYDROcodone-acetaminophen (NORCO/VICODIN) 5-325 MG tablet Take 1 tablet by mouth every 6 (six) hours as needed for moderate pain.   Yes [provider]  lisinopril (ZESTRIL) 20 MG tablet  Take 20 mg by mouth daily.   Yes [provider]  lubiprostone (AMITIZA) 24 MCG capsule Take 24 mcg by mouth 2 (two) times daily with a meal.   Yes [provider]  metFORMIN (GLUCOPHAGE) 500 MG tablet Take 500 mg by mouth 2 (two) times daily.   Yes [provider]  naproxen sodium (ANAPROX) 220 MG tablet Take 440 mg by mouth 2 (two) times daily as needed (pain).   Yes [provider]  NOVOLIN 70/30 RELION (70-30) 100 UNIT/ML injection Inject 30 Units into the skin 2 (two) times daily with a meal.  11/21/16  Yes [provider]  pravastatin (PRAVACHOL) 80 MG tablet Take 80 mg by mouth at bedtime.   Yes [provider]  sucralfate (CARAFATE) 1 g tablet Take 1 g by mouth 4 (four) times daily -  with meals and at bedtime.   Yes [provider]   metoCLOPramide (REGLAN) 10 MG tablet Take 1 tablet (10 mg total) by mouth every 6 (six) hours. Patient not taking: Reported on 02/27/2019 05/08/17   Recardo Evangelist, PA-C  naproxen (NAPROSYN) 375 MG tablet Take 1 tablet (375 mg total) by mouth 2 (two) times daily. Patient not taking: Reported on 02/27/2019 01/31/18   Langston Masker B, PA-C  omeprazole (PRILOSEC) 20 MG capsule Take 1 capsule (20 mg total) by mouth 2 (two) times daily before a meal. Patient not taking: Reported on 02/27/2019 03/15/17   Ladene Artist, MD  RELION INSULIN SYR .3CC/29G 29G X 1/2" 0.3 ML MISC  11/16/16   [provider]   Liver Function Tests Recent Labs  Lab 03/07/19 0425 03/08/19 0400 03/11/19 0500 03/11/19 1900  AST 118* 35 32  --   ALT 80* 65* 48*  --   ALKPHOS 226* 192* 146*  --   BILITOT 0.4 0.6 0.3  --   PROT 6.8 6.2* 6.0*  --   ALBUMIN 2.8* 2.6* 2.1* 2.0*   No results for input(s): LIPASE, AMYLASE in the last 168 hours. CBC Recent Labs  Lab 03/10/19 0445  03/11/19 0500 03/11/19 1539 03/11/19 1900  WBC 44.4*  --  39.0*  --  39.6*  NEUTROABS 38.4*  --  32.9*  --   --   HGB 7.7*   < > 6.8* 6.1* 8.1*  HCT 24.6*   < > 22.2* 18.0* 25.4*  MCV 90.4  --  92.5  --  91.4  PLT 181  --  156  --  139*   < > = values in this interval not displayed.   Basic Metabolic Panel Recent Labs  Lab 03/05/19 0408  03/05/19 1700 03/06/19 0430 03/07/19 0425 03/08/19 0400 03/09/19 0518 03/10/19 0445 03/10/19 1150 03/10/19 2329 03/11/19 0500 03/11/19 1539 03/11/19 1900  NA 151*   < >  --  146* 143 142 144 140 139 140 143 139 140  K 4.7   < >  --  4.3 4.0 3.8 3.8 3.9 4.0 4.2 3.8 4.5 4.7  CL 117*  --   --  113* 109 109 108 107  --   --  108  --  106  CO2 23  --   --  '24 24 23 22 '$ 20*  --   --  22  --  22  GLUCOSE 220*  --   --  260* 267* 182* 164* 360*  --   --  153*  --  155*  BUN 92*  --   --  100* 91* 97*  93* 102*  --   --  141*  --  110*  CREATININE 3.26*  --   --  3.21* 2.94* 2.90* 2.99* 3.07*   --   --  3.75*  --  4.14*  CALCIUM 7.3*  --   --  7.7* 8.2* 8.3* 8.6* 8.2*  --   --  8.2*  --  8.0*  PHOS 3.5  --  3.7  --   --   --   --   --   --   --   --   --  6.4*   < > = values in this interval not displayed.   Iron/TIBC/Ferritin/ %Sat    Component Value Date/Time   IRON 14 (L) 02/27/2019 0215   TIBC 350 02/27/2019 0215   FERRITIN 154 03/08/2019 0400   IRONPCTSAT 4 (L) 02/27/2019 0215    Vitals:   03/11/19 2230 03/11/19 2245 03/11/19 2308 03/11/19 2313  BP: (!) 94/48 (!) 92/46 (!) 102/46   Pulse: 83 80 81   Resp: '12 15 15   '$ Temp:    98.6 F (37 C)  TempSrc:    Oral  SpO2: 94% 93% 96%   Weight:      Height:        Exam  Patient not examined directly given COVID-19 + status, utilizing exam of the primary team and observations of RN's.     Home meds:  - lisinopril 20 qd/ hydrochlorothiazide 25 qd  - novolin 70/30 30 bid/ metformin 500 bid  - metoclopramide 10 qid/ sucralfate 1g qid  - duloxetine 30 qd/ gabapentin 300 tid/ vicodin qid prn  - naproxen 440 bid prn/ pravastatin 80 hs   UNa 76, UCr 58 on 02/27/19   Date   Creat  eGFR  2009- 11/2016  1.3- 1.8 32- 50  CKD III  05/2017  2.61  21  CKD IV  02/10/2019- 03/11/19 7.09 >> 4.14    Renal US >  8.8/ 10 cm kidneys, ^echogenicity c/w CKD, no hydro  CXR 7/11 > Patchy bilateral opacities, left greater than right, worrisome for an infectious process  total I/O 6/29 - 7/10 = 28L in and 27.8 L out, +365  admit wt = 77kg, peak 82.4kg today   FeSat 4% on 6/29, ferr 244   Assessment/ Plan: 1. AKI on CKDIV - baseline creat around 2.5 from 2018.  Echogenic kidneys on [place].  B/Cr improved early in pt's stay w/ hydration down to creat 2.9, now rising.  Borderline hypotension may be contributing. Get UA and repeat urine lytes. Renal US.  Plan is for CRRT for now, heparin 500u/hr flat rate. Will follow.  2. Volume - no CVP available, CXR's not helpful w/ bilat infiltrates. Up 6kg by wt's from admission.  3. DM2 on  insulin 4. COVID-19 PNA : per primary 5. Anemia: possibly CKD related in part, Hb 9 on admit and 8's now.  Consider ESA.  Transfuse prn.       Kelly Splinter  MD 03/11/2019, 11:13 PM

## 2019-03-11 NOTE — Progress Notes (Signed)
PROGRESS NOTE  Nichole Franklin YFV:[id number] DOB: 03/26/1951 DOA: 02/05/2019  PCP: Elwyn Reach, MD  Brief History/Interval Summary: Patient is a 68 y.o. female with PMHx of DM-2, HTN, dyslipidemia, CKD stage IV who presented to the hospital initially for evaluation of weakness, she was found to have fever and acute kidney injury.  She was subsequently admitted to [facility] her renal function stabilized-she was transferred to [facility] course since admission has been complicated by encephalopathy, angioedema following initiation of Remdesivir-and respiratory failure requiring intubation on 7/4.  Reason for Visit: Acute respiratory disease due to COVID-19  Consultants: Pulmonology  Procedures:  Intubation 7/4  Central line left IJ 7/4  Antibiotics: Anti-infectives (From admission, onward)   Start     Dose/Rate Route Frequency Ordered Stop   03/07/19 1000  ceFEPIme (MAXIPIME) 2 g in sodium chloride 0.9 % 100 mL IVPB     2 g 200 mL/hr over 30 Minutes Intravenous Every 24 hours 03/06/19 1434     03/04/19 2200  ceFEPIme (MAXIPIME) 1 g in sodium chloride 0.9 % 100 mL IVPB  Status:  Discontinued     1 g 200 mL/hr over 30 Minutes Intravenous Every 12 hours 03/04/19 1256 03/06/19 1434   03/04/19 1400  remdesivir 100 mg in sodium chloride 0.9 % 250 mL IVPB  Status:  Discontinued     100 mg 500 mL/hr over 30 Minutes Intravenous Every 24 hours 03/03/19 1305 03/04/19 0936   03/04/19 1200  linezolid (ZYVOX) IVPB 600 mg  Status:  Discontinued     600 mg 300 mL/hr over 60 Minutes Intravenous Every 12 hours 03/04/19 1141 03/05/19 1100   03/04/19 0900  linezolid (ZYVOX) IVPB 600 mg  Status:  Discontinued     600 mg 300 mL/hr over 60 Minutes Intravenous Every 12 hours 03/04/19 0759 03/04/19 1141   03/04/19 0800  ceFEPIme (MAXIPIME) 2 g in sodium chloride 0.9 % 100 mL IVPB  Status:  Discontinued     2 g 200 mL/hr over 30 Minutes Intravenous Every 12 hours  03/04/19 0706 03/04/19 1256   03/03/19 1400  remdesivir 200 mg in sodium chloride 0.9 % 250 mL IVPB     200 mg 500 mL/hr over 30 Minutes Intravenous Once 03/03/19 1305 03/03/19 1500   02/27/19 2000  azithromycin (ZITHROMAX) 500 mg in sodium chloride 0.9 % 250 mL IVPB  Status:  Discontinued     500 mg 250 mL/hr over 60 Minutes Intravenous Every 24 hours 02/27/19 0428 03/04/19 1211   02/27/19 0430  cefTRIAXone (ROCEPHIN) 1 g in sodium chloride 0.9 % 100 mL IVPB  Status:  Discontinued     1 g 200 mL/hr over 30 Minutes Intravenous Every 24 hours 02/27/19 0428 03/04/19 0706       Subjective/Interval History: Patient had to be reintubated yesterday evening due to worsening mental status and to protect her airway.  She is intubated and sedated this morning.    Assessment/Plan:  Acute Hypoxic Resp. Failure due to Acute Covid 19 Viral Illness  Vent Mode: PRVC FiO2 (%):  [30 %-100 %] 30 % Set Rate:  [14 bmp] 14 bmp Vt Set:  [470 mL] 470 mL PEEP:  [5 cmH20] 5 cmH20 Plateau Pressure:  [22 cmH20-29 cmH20] 28 cmH20     Component Value Date/Time   PHART 7.322 (L) 03/10/2019 2329   PCO2ART 46.9 03/10/2019 2329   PO2ART 515.0 (H) 03/10/2019 2329   HCO3 24.2 03/10/2019 2329   TCO2 26 03/10/2019  2329   ACIDBASEDEF 2.0 03/10/2019 2329   O2SAT 100.0 03/10/2019 2329    COVID-19 Labs  Recent Labs    03/09/19 0518 03/10/19 0445 03/11/19 0500  DDIMER 12.42* 8.87* 6.95*    Lab Results  Component Value Date   SARSCOV2NAA POSITIVE (A) 02/01/2019     Fever: No fever in the last many days Oxygen requirements: Mechanical ventilation.  30% FiO2.  Saturating in the 90s.    Antibiotics: Remains on cefepime.  Today is day 8.   Remdesivir: Patient developed angioedema to Remdesivir Steroids: She has been taken off of steroids due to encephalopathy Diuretics: She has received a furosemide during this hospitalization. last dose was on 7/9 Actemra: Did not receive Convalescent Plasma: Did not  receive Vitamin C and Zinc: Continue DVT Prophylaxis: On heparin 7500 units subcutaneously every 8 hours  Patient was initially intubated due to angioedema thought to be secondary to Remdesivir.  Angioedema resolved.  Patient was extubated on 7/8.  She did have some stridor at the same evening which was treated with nebulizer treatments and steroids.  This improved.  However her mentation continued to get worse.  She was unable to protect her airway yesterday evening and had to be reintubated.  Her COVID-19 status seems to be stable.  Procalcitonin is noted to be elevated at 2.65 however this is in the setting of acute renal failure.  Significance of this is not entirely clear.  This is day 8 of her cefepime.  WBC has improved.  Leukocytosis is most likely due to steroids.  Continue cefepime to complete 10 days of treatment.  Acute metabolic encephalopathy She had encephalopathy earlier during the course of the hospitalization thought to be due to hypoxemia and other acute metabolic derangement.  CT scan done on 7/4 did not show any acute findings.  After extubation her mentation has been progressively getting worse.  She had a CT scan done yesterday which did not show any acute findings.  She had to be reintubated as she was not able to protect her airway.  Mentation could also be altered due to uremia.  Steroids were discontinued.  Now she is on mechanical ventilation.  Acute kidney injury on chronic kidney disease stage IV Baseline creatinine around 1.6-2.  Presented with a BUN of 81 and a creatinine of 7.09.  Renal function had improved.  But has been getting worse over the last 2448 hrs.  Could have been due to furosemide.  Urine output appears to be decreasing.  Discussed with pulmonology.  We will consult nephrology as the patient will likely need CRRT.   Elevated d-dimer Patient was empirically started on IV heparin on 7/4 due to concern for VTE.  Due to drop in hemoglobin this was discontinued.   Lower extremity Doppler study was negative for DVT.  Clinically there was low suspicion for VTE.  D-dimer continues to trend down. Continue high-dose subcutaneous heparin for now.    Leukocytosis WBC noted to be slightly better today.  She is afebrile.  No diarrhea.  Procalcitonin noted to be 2.65 today.  Leukocytosis is most likely due to steroids.  She is on cefepime which will be continued for now.  Continue to trend WBC.  Angioedema Most likely secondary to Remdesivir.  Tongue swelling has improved.  Hypernatremia Resolved with free water.  Monitor electrolytes closely.  Diabetes mellitus type 2 with uncontrolled hyperglycemia No longer on insulin infusion.  Patient is on Lantus insulin and SSI.  HbA1c 9.3.  Lantus dose was  increased yesterday with better control of CBG.  Continue to monitor.  Essential hypertension Blood pressure has been better controlled over the last 12 hours.  Continue to monitor.  Continue amlodipine and as needed labetalol for now.    Normocytic anemia  Hemoglobin noted to be 6.8 this morning.  Blood transfusion has been ordered.  She was last transfused on 7/6.    Nutrition Being fed through a feeding tube.  Speech therapy to evaluate.   DVT Prophylaxis: Subcutaneous heparin PUD Prophylaxis: Protonix Code Status: Full code Family Communication: Pulmonology has been discussing with patient's family. Disposition Plan: Remain in ICU for now   Medications:  Scheduled: . sodium chloride   Intravenous Once  . sodium chloride   Intravenous Once  . amLODipine  10 mg Per Tube Daily  . chlorhexidine  15 mL Mouth Rinse BID  . chlorhexidine gluconate (MEDLINE KIT)  15 mL Mouth Rinse BID  . Chlorhexidine Gluconate Cloth  6 each Topical Q0600  . cholecalciferol  1,000 Units Per Tube Daily  . feeding supplement (PRO-STAT SUGAR FREE 64)  30 mL Oral BID  . ferrous sulfate  300 mg Per Tube BID WC  . free water  300 mL Per Tube Q4H  . heparin injection  (subcutaneous)  7,500 Units Subcutaneous Q8H  . insulin aspart  0-20 Units Subcutaneous Q4H  . insulin glargine  35 Units Subcutaneous BID  . ipratropium-albuterol  3 mL Nebulization Q6H  . labetalol  10 mg Intravenous Q8H  . mouth rinse  15 mL Mouth Rinse q12n4p  . mouth rinse  15 mL Mouth Rinse 10 times per day  . pantoprazole sodium  40 mg Per Tube Daily  . polyethylene glycol  17 g Per Tube Daily  . senna  2 tablet Per Tube QHS  . vitamin C  1,000 mg Per Tube Daily  . zinc sulfate  220 mg Per Tube Daily   Continuous: .  prismasol BGK 4/2.5    .  prismasol BGK 4/2.5    . sodium chloride Stopped (03/11/19 0947)  . ceFEPime (MAXIPIME) IV 200 mL/hr at 03/11/19 1000  . feeding supplement (JEVITY 1.2 CAL) 1,000 mL (03/11/19 1230)  . heparin 10,000 units/ 20 mL infusion syringe    . prismasol BGK 4/2.5     QQP:[id number] **OR** acetaminophen, alteplase, dextrose, EPINEPHrine, fentaNYL (SUBLIMAZE) injection, fentaNYL (SUBLIMAZE) injection, guaiFENesin-dextromethorphan, heparin, heparin, hydrALAZINE, lip balm, midazolam, midazolam, morphine injection, ondansetron **OR** ondansetron (ZOFRAN) IV, sodium chloride   Objective:  Vital Signs  Vitals:   03/11/19 1100 03/11/19 1152 03/11/19 1200 03/11/19 1234  BP: 135/61 116/88 (!) 123/59   Pulse: (!) 102 (!) 101 99   Resp: (!) 35 (!) 36 (!) 37   Temp:   99.4 F (37.4 C)   TempSrc:   Axillary   SpO2: 97% 95% 97% 95%  Weight:      Height:        Intake/Output Summary (Last 24 hours) at 03/11/2019 1238 Last data filed at 03/11/2019 1000 Gross per 24 hour  Intake 2750.18 ml  Output 605 ml  Net 2145.18 ml   Filed Weights   03/09/19 0410 03/10/19 0411 03/11/19 0500  Weight: 81.3 kg 80.9 kg 82.4 kg   General appearance: Intubated and sedated. Resp: Coarse breath sounds bilaterally.  No wheezing rales or rhonchi.   Cardio: S1-S2 is normal regular.  No S3-S4.  No rubs murmurs or bruit GI: Abdomen is soft.  Nontender nondistended.   Bowel sounds are present normal.  No masses organomegaly Extremities: Minimal edema bilateral lower extremities.  Left leg cooler compared to right.  Pulses are palpable but poor on the left.  No color changes to the lower extremities. Neurologic: Now intubated and sedated.    Lab Results:  Data Reviewed: I have personally reviewed following labs and imaging studies  CBC: Recent Labs  Lab 03/07/19 0425 03/08/19 0400 03/09/19 0518 03/10/19 0445 03/10/19 1150 03/10/19 2329 03/11/19 0500  WBC 38.4* 35.7* 43.4* 44.4*  --   --  39.0*  NEUTROABS  --   --   --  38.4*  --   --  32.9*  HGB 8.8* 7.9* 8.5* 7.7* 8.2* 8.2* 6.8*  HCT 29.1* 25.4* 27.1* 24.6* 24.0* 24.0* 22.2*  MCV 90.1 88.8 88.9 90.4  --   --  92.5  PLT 210 195 195 181  --   --  294    Basic Metabolic Panel: Recent Labs  Lab 03/04/19 1558 03/05/19 0408  03/05/19 1700  03/07/19 0425 03/08/19 0400 03/09/19 0518 03/10/19 0445 03/10/19 1150 03/10/19 2329 03/11/19 0500  NA  --  151*   < >  --    < > 143 142 144 140 139 140 143  K  --  4.7   < >  --    < > 4.0 3.8 3.8 3.9 4.0 4.2 3.8  CL  --  117*  --   --    < > 109 109 108 107  --   --  108  CO2  --  23  --   --    < > '24 23 22 '$ 20*  --   --  22  GLUCOSE  --  220*  --   --    < > 267* 182* 164* 360*  --   --  153*  BUN  --  92*  --   --    < > 91* 97* 93* 102*  --   --  141*  CREATININE  --  3.26*  --   --    < > 2.94* 2.90* 2.99* 3.07*  --   --  3.75*  CALCIUM  --  7.3*  --   --    < > 8.2* 8.3* 8.6* 8.2*  --   --  8.2*  MG 1.9 1.9  --  2.6*  --   --   --   --   --   --   --   --   PHOS 4.3 3.5  --  3.7  --   --   --   --   --   --   --   --    < > = values in this interval not displayed.    GFR: Estimated Creatinine Clearance: 15.5 mL/min (A) (by C-G formula based on SCr of 3.75 mg/dL (H)).  Liver Function Tests: Recent Labs  Lab 03/05/19 0700 03/06/19 0430 03/07/19 0425 03/08/19 0400 03/11/19 0500  AST 51* 44* 118* 35 32  ALT 52* 44 80* 65* 48*   ALKPHOS 174* 154* 226* 192* 146*  BILITOT 0.6 0.3 0.4 0.6 0.3  PROT 6.1* 5.9* 6.8 6.2* 6.0*  ALBUMIN 2.6* 2.5* 2.8* 2.6* 2.1*    Cardiac Enzymes: Recent Labs  Lab 03/04/19 1558  CKTOTAL 374*     CBG: Recent Labs  Lab 03/10/19 1949 03/10/19 2357 03/11/19 0433 03/11/19 0737 03/11/19 1130  GLUCAP 183* 214* 161* 141* 150*     Anemia Panel: No results for input(s): VITAMINB12,  FOLATE, FERRITIN, TIBC, IRON, RETICCTPCT in the last 72 hours.  Recent Results (from the past 240 hour(s))  Culture, respiratory (non-expectorated)     Status: None   Collection Time: 03/04/19 11:41 AM   Specimen: Tracheal Aspirate; Respiratory  Result Value Ref Range Status   Specimen Description   Final    TRACHEAL ASPIRATE Performed at [facility] [address], [place], [place] [address]    Special Requests   Final    NONE Performed at [facility], [place] [address], [place], [place] [address]    Gram Stain   Final    RARE WBC PRESENT, PREDOMINANTLY PMN MODERATE SQUAMOUS EPITHELIAL CELLS PRESENT ABUNDANT GRAM POSITIVE COCCI    Culture   Final    MODERATE Consistent with normal respiratory flora. Performed at [facility], [place] [address], [place], [place] [address]    Report Status 03/07/2019 FINAL  Final  MRSA PCR Screening     Status: None   Collection Time: 03/04/19  7:08 PM   Specimen: Nasal Mucosa; Nasopharyngeal  Result Value Ref Range Status   MRSA by PCR NEGATIVE NEGATIVE Final    Comment:        The GeneXpert MRSA Assay (FDA approved for NASAL specimens only), is one component of a comprehensive MRSA colonization surveillance program. It is not intended to diagnose MRSA infection nor to guide or monitor treatment for MRSA infections. Performed at [facility], [place] [address] [place], [place] [address]       Radiology Studies: Dg Chest 1 View  Result Date: 03/10/2019 CLINICAL DATA:  Evaluate ETT  placement EXAM: CHEST  1 VIEW COMPARISON:  March 09, 2019 FINDINGS: The ETT terminates 1.8 cm above the carina. The feeding tube terminates below today's film. A left central line is stable in appropriate position. No pneumothorax. Hazy opacities in the lungs, left greater than right are stable. The cardiomediastinal silhouette is stable. IMPRESSION: 1. The ETT terminates 1.8 cm above the carina. Recommend withdrawing 1 cm. Other support apparatus as above. 2. Mild hazy opacity remains in the lungs, particularly the left lung. This could represent atypical infection or asymmetric pulmonary edema. Electronically Signed   By: Dorise Bullion III M.D   On: 03/10/2019 18:33   Ct Head Wo Contrast  Result Date: 03/10/2019 CLINICAL DATA:  Altered level of consciousness. EXAM: CT HEAD WITHOUT CONTRAST TECHNIQUE: Contiguous axial images were obtained from the base of the skull through the vertex without intravenous contrast. COMPARISON:  March 04, 2019 FINDINGS: Brain: No subdural, epidural, or subarachnoid hemorrhage. Cerebellum, brainstem, and basal cisterns normal. Ventricles and sulci are unremarkable. Scattered white matter changes remain. No acute cortical ischemia or infarct. No mass effect or midline shift. Vascular: Calcified atherosclerosis is seen in the intracranial carotids. Skull: Normal. Negative for fracture or focal lesion. Sinuses/Orbits: There is debris in the sphenoid sinuses. Paranasal sinuses are otherwise normal. Middle ears are well aerated. The right mastoid air cells are well aerated. There is fluid in a few left mastoid air cells without erosion or overlying soft tissue swelling. Other: None. IMPRESSION: 1. No acute intracranial abnormalities. Scattered white matter changes. Electronically Signed   By: Dorise Bullion III M.D   On: 03/10/2019 13:50   [place] Ekg Site Rite  Result Date: 03/11/2019 If Site Rite image not attached, placement could not be confirmed due to current cardiac rhythm.       LOS: 13 days   [facility]  Pager on [email or web address]  03/11/2019, 12:38 PM

## 2019-03-11 NOTE — Progress Notes (Signed)
NAME:  Nichole Franklin, MRN:  [id number], DOB:  1951-04-12, LOS: 42 ADMISSION DATE:  02/21/2019, CONSULTATION DATE:  7/4 REFERRING MD:  Aileen Fass, CHIEF COMPLAINT:  Found confused, tongue swelling   Brief History   68 y/o female with extensive PMH admitted June 28 in the setting of a new COVID 19 diagnosis.  Admitted to [facility] due to renal failure which improved with IV fluids.  Transferred to [facility].  On hospital day 6 she developed worsening confusion and tongue swelling felt to be a reaction to remdesivir; then was transferred to the ICU emergently for intubation.   Past Medical History  Hypertension Hyperlipidemia Diabetes mellitus Gastroesophageal reflux disease History of cervical cancer Anxiety  Significant Hospital Events   June 28 admission [facility] from [facility] for AKI, poor po intake June 30 transfer to [facility], confusion July 2: worsening hypoxemia July 3 remdesivir, worsening confusion July 4 tongue swelling, hypertension, severe encephalopathy, transfer to ICU, intubation, new worsening fever - Admitted to the ICU for worsening confusion, required emergent intubation Tongue swelling noted July 5 > July 7 tongue swelling removed, awake, not extubated July 8 extubated, some stridor July 9 mild increased work of breathing, steroids added, did not sleep the night before, trazodone added, mild delirium July 10 worsening delirium, Intubated for inability to protect airway  July 11 worsening renal function  Consults:  Nephrology, signed off PCCM  Procedures:  July 4 left IJ CVL July 4 endotracheal tube July 8 Cor-Trak gastric tube  Significant Diagnostic Tests:  June 28 CT scan abdomen pelvis for nephrolithiasis: No explanation of renal failure, no urinary calculus, extensive multifocal groundglass opacity in lungs, sclerotic inferior endplate deformities of T11 and T12 July 4 CT Head > pending  Micro Data:  7/4 resp culture > OPF  Antimicrobials:   6/28 ceftriaxone/azithro > July 3 7/3 cefepime >  7/4 Linezolid > 7/5   Interim history/subjective:   Worsening renal dysfyunction Minimally alert  Objective   Blood pressure (!) 133/55, pulse 82, temperature 99.1 F (37.3 C), temperature source Axillary, resp. rate (!) 30, height 5\' 6"  (1.676 m), weight 82.4 kg, SpO2 100 %.    Vent Mode: PRVC FiO2 (%):  [50 %-100 %] 50 % Set Rate:  [14 bmp] 14 bmp Vt Set:  [470 mL] 470 mL PEEP:  [5 cmH20] 5 cmH20 Plateau Pressure:  [22 cmH20-29 cmH20] 28 cmH20   Intake/Output Summary (Last 24 hours) at 03/11/2019 0757 Last data filed at 03/11/2019 0700 Gross per 24 hour  Intake 3624.62 ml  Output 810 ml  Net 2814.62 ml   Filed Weights   03/09/19 0410 03/10/19 0411 03/11/19 0500  Weight: 81.3 kg 80.9 kg 82.4 kg    Examination:  General:  In bed on vent HENT: NCAT ETT in place PULM: CTA B, vent supported breathing CV: RRR, no mgr GI: BS+, soft, nontender MSK: normal bulk and tone Neuro: sedated on vent  7/11 CXR images personallyr eviewed> likely interstitial edema bilaterally, ETT in place   Resolved Hospital Problem list   Angioedema due to remdesivir, requirement for intubation> resolved Mild stridor 7/9> resolved  Assessment & Plan:  Acute encephalopathy> persistent Lack of sleep has led to ICU delirium Frequent orientation Minimize sedation Passive range of motion in bed Lights on during daytime, off at night  COVID 19 pneumonia> improved, CXR normal, oxygenation OK Chest x-ray findings most consistent with volume overload Worsening acute on chronic renal failure Contact renal, start CVVHD, volume removal as able  Acute respiratory failure with hypoxemia due to inability to protect airway: Continue full mechanical ventilatory support via ventilator stabilizing protocol Tidal volume 6 to 8 cc/kg Monitor SaO2, titrate PEEP and FiO2 per standard protocol Minimize sedation, RA SS goal 0 to -1, intermittent  fentanyl/versed only  Fever 7/4> due to COVID Elevated WBC Remove CVL Place PICC  Best practice:  Diet: start tube feeding Pain/Anxiety/Delirium protocol (if indicated): n/a VAP protocol (if indicated): n/a  DVT prophylaxis: sub q heparin GI prophylaxis: famotidine Glucose control: SSI Mobility: bed rest Code Status: full Family Communication: I have updated her daughter [name] at length today, let her know that her mother is developing multiorgan failure requiring multiple forms of life support and this portends a poor prognosis.  [name] has a very good understanding of the implications of hemodialysis as she has had multiple family members on hemodialysis in the past.  She understands that hemodialysis is supportive and in no way curative and understands that when people go on hemodialysis at some marker of severe underlying disease.  Knowing this, she is still willing to attempt it to help her mother recover. Remains Full Code Disposition: remain in ICU  Labs   CBC: Recent Labs  Lab 03/07/19 0425 03/08/19 0400 03/09/19 0518 03/10/19 0445 03/10/19 1150 03/10/19 2329 03/11/19 0500  WBC 38.4* 35.7* 43.4* 44.4*  --   --  39.0*  NEUTROABS  --   --   --  38.4*  --   --  PENDING  HGB 8.8* 7.9* 8.5* 7.7* 8.2* 8.2* 6.8*  HCT 29.1* 25.4* 27.1* 24.6* 24.0* 24.0* 22.2*  MCV 90.1 88.8 88.9 90.4  --   --  92.5  PLT 210 195 195 181  --   --  202    Basic Metabolic Panel: Recent Labs  Lab 03/04/19 1558 03/05/19 0408  03/05/19 1700  03/07/19 0425 03/08/19 0400 03/09/19 0518 03/10/19 0445 03/10/19 1150 03/10/19 2329 03/11/19 0500  NA  --  151*   < >  --    < > 143 142 144 140 139 140 143  K  --  4.7   < >  --    < > 4.0 3.8 3.8 3.9 4.0 4.2 3.8  CL  --  117*  --   --    < > 109 109 108 107  --   --  108  CO2  --  23  --   --    < > 24 23 22  20*  --   --  22  GLUCOSE  --  220*  --   --    < > 267* 182* 164* 360*  --   --  153*  BUN  --  92*  --   --    < > 91* 97* 93*  102*  --   --  141*  CREATININE  --  3.26*  --   --    < > 2.94* 2.90* 2.99* 3.07*  --   --  3.75*  CALCIUM  --  7.3*  --   --    < > 8.2* 8.3* 8.6* 8.2*  --   --  8.2*  MG 1.9 1.9  --  2.6*  --   --   --   --   --   --   --   --   PHOS 4.3 3.5  --  3.7  --   --   --   --   --   --   --   --    < > =  values in this interval not displayed.   GFR: Estimated Creatinine Clearance: 15.5 mL/min (A) (by C-G formula based on SCr of 3.75 mg/dL (H)). Recent Labs  Lab 03/05/19 0408  03/08/19 0400 03/09/19 0518 03/10/19 0445 03/11/19 0500  PROCALCITON 0.94  --   --   --  2.45 2.65  WBC 21.1*   < > 35.7* 43.4* 44.4* 39.0*   < > = values in this interval not displayed.    Liver Function Tests: Recent Labs  Lab 03/05/19 0700 03/06/19 0430 03/07/19 0425 03/08/19 0400 03/11/19 0500  AST 51* 44* 118* 35 32  ALT 52* 44 80* 65* 48*  ALKPHOS 174* 154* 226* 192* 146*  BILITOT 0.6 0.3 0.4 0.6 0.3  PROT 6.1* 5.9* 6.8 6.2* 6.0*  ALBUMIN 2.6* 2.5* 2.8* 2.6* 2.1*   No results for input(s): LIPASE, AMYLASE in the last 168 hours. Recent Labs  Lab 03/11/19 0500  AMMONIA 45*    ABG    Component Value Date/Time   PHART 7.322 (L) 03/10/2019 2329   PCO2ART 46.9 03/10/2019 2329   PO2ART 515.0 (H) 03/10/2019 2329   HCO3 24.2 03/10/2019 2329   TCO2 26 03/10/2019 2329   ACIDBASEDEF 2.0 03/10/2019 2329   O2SAT 100.0 03/10/2019 2329     Coagulation Profile: No results for input(s): INR, PROTIME in the last 168 hours.  Cardiac Enzymes: Recent Labs  Lab 03/04/19 1558  CKTOTAL 374*    HbA1C: Hgb A1c MFr Bld  Date/Time Value Ref Range Status  02/27/2019 02:15 AM 9.3 (H) 4.8 - 5.6 % Final    Comment:    (NOTE) Pre diabetes:          5.7%-6.4% Diabetes:              >6.4% Glycemic control for   <7.0% adults with diabetes   08/05/2010 05:10 AM (H) <5.7 % Final   8.0 (NOTE)                                                                       According to the ADA Clinical Practice  Recommendations for 2011, when HbA1c is used as a screening test:   >=6.5%   Diagnostic of Diabetes Mellitus           (if abnormal result  is confirmed)  5.7-6.4%   Increased risk of developing Diabetes Mellitus  References:Diagnosis and Classification of Diabetes Mellitus,Diabetes ZDGU,4403,47(QQVZD 1):S62-S69 and Standards of Medical Care in         Diabetes - 2011,Diabetes Care,2011,34  (Suppl 1):S11-S61.    CBG: Recent Labs  Lab 03/10/19 1608 03/10/19 1949 03/10/19 2357 03/11/19 0433 03/11/19 0737  GLUCAP 282* 183* 214* 161* 141*     She remains critically with severe encephalopathy and multiple organ system failures which require database monitoring and discussion with a large multi-disciplinary team caring for her.  Critical care time: 40 minutes    Roselie Awkward, MD [place] [facility] Pager: [phone] Cell: [phone] If no response, call [phone]

## 2019-03-11 NOTE — Progress Notes (Signed)
Peripherally Inserted Central Catheter/Midline Placement  The IV Nurse has discussed with the patient and/or persons authorized to consent for the patient, the purpose of this procedure and the potential benefits and risks involved with this procedure.  The benefits include less needle sticks, lab draws from the catheter, and the patient may be discharged home with the catheter. Risks include, but not limited to, infection, bleeding, blood clot (thrombus formation), and puncture of an artery; nerve damage and irregular heartbeat and possibility to perform a PICC exchange if needed/ordered by physician.  Alternatives to this procedure were also discussed.  Bard Power PICC patient education guide, fact sheet on infection prevention and patient information card has been provided to patient /or left at bedside.    PICC/Midline Placement Documentation  PICC Double Lumen 03/11/19 PICC Right Brachial 41 cm 0 cm (Active)    Telephone consent by Daughter   Nichole Franklin 03/11/2019, 6:25 PM

## 2019-03-11 NOTE — Progress Notes (Signed)
[facility] Progress Note Patient Name: Nichole Franklin DOB: Dec 23, 1950 MRN: [id number]   Date of Service  03/11/2019  HPI/Events of Note  Notified by RN of hypotension.  Pt is undergoing CRRT.  I was asked earlier to change the order set for CRRT.  This has been clarified by Nephrology.   Pt apparently also spike a tem of 101.1F tonight.  Last culture was from 6/28.   eICU Interventions  Repeat blood cultures.  Continue antibiotics. Start on Levophed.     Intervention Category Intermediate Interventions: Hypotension - evaluation and management  Elsie Lincoln 03/11/2019, 10:14 PM

## 2019-03-11 NOTE — Progress Notes (Signed)
Patient continues to have tachypnea and abdominal breathing despite PRN meds. Notified McQuaid, MD. Orders for Fentanyl drip.

## 2019-03-11 NOTE — Progress Notes (Signed)
PHARMACY NOTE:  ANTIMICROBIAL RENAL DOSAGE ADJUSTMENT  Current antimicrobial regimen includes a mismatch between antimicrobial dosage and estimated renal function.  As per policy approved by the Pharmacy & Therapeutics and Medical Executive Committees, the antimicrobial dosage will be adjusted accordingly.  Current antimicrobial dosage:  Cefepime 2 g iv q 24h  Indication: empiric  Renal Function:  Estimated Creatinine Clearance: 14.1 mL/min (A) (by C-G formula based on SCr of 4.14 mg/dL (H)). []      On intermittent HD, scheduled: [x]      On CRRT    Antimicrobial dosage has been changed to:  Cefepime 2 g iv q 12h  Additional comments:   Thank you for allowing pharmacy to be a part of this patient's care.  Napoleon Form, [facility] 03/11/2019 9:51 PM

## 2019-03-11 NOTE — Procedures (Signed)
Hemodialysis Catheter Insertion Procedure Note Nichole Franklin [id number] [birth year]  Procedure: Insertion of Central Venous Catheter Indications: Acute kidney injury, need for hemodialysis  Procedure Details Consent: Risks of procedure as well as the alternatives and risks of each were explained to the (patient/caregiver).  Consent for procedure obtained. Time Out: Verified patient identification, verified procedure, site/side was marked, verified correct patient position, special equipment/implants available, medications/allergies/relevent history reviewed, required imaging and test results available.  Performed  Maximum sterile technique was used including antiseptics, cap, gloves, gown, hand hygiene, mask and sheet. Skin prep: Chlorhexidine; local anesthetic administered A antimicrobial bonded/coated triple lumen catheter was placed in the right internal jugular vein using the Seldinger technique.  Ultrasound was used to verify the patency of the vein and for real time needle guidance.  Evaluation Blood flow good Complications: No apparent complications Patient did tolerate procedure well. Chest X-ray ordered to verify placement.  CXR: pending.  [name] 03/11/2019, 5:34 PM

## 2019-03-11 NOTE — Progress Notes (Signed)
[facility] PCCM  Air hunger Receiving multiple doses of fentanyl Add fentanyl infusion  Roselie Awkward, MD  PCCM Pager: [phone] Cell: [phone] If no response, call [phone]

## 2019-03-11 NOTE — Progress Notes (Signed)
[facility] PCCM  Increased work of breathing ABG OK On fentanyl infusion, increase top end of infusion dose ABG OK but Hgb reportedly 6.1 gm/dL Extra versed injection now Transfuse 1 U PRBC now Plan starting CVVHD soon  Roselie Awkward, MD [place] PCCM Pager: [phone] Cell: [phone] If no response, call [phone]

## 2019-03-11 NOTE — Progress Notes (Signed)
CRITICAL VALUE STICKER  CRITICAL VALUE: Hgb 6.8  DATE & TIME NOTIFIED: 7/11 0830  MD NOTIFIED: Lake Bells  RESPONSE: Orders for 1 unit of PRBC

## 2019-03-11 NOTE — Progress Notes (Signed)
Per Dr. Jonnie Finner (nephro) will start heparin syringe via CRRT at 500 units/hr. He said could titrate up by steps of 250u/hr if filter clots off.

## 2019-03-11 NOTE — Progress Notes (Addendum)
Secure chatted Dr. Lake Bells and Dr. Jonnie Finner about PICC order. CrCl is 16. I couldn't find any notes from Nephrology. Per Dr. Anastasia Pall note from today, nephrology had signed off. Dr. Lake Bells and Dr. Jonnie Finner said that they were fine with PICC placement.

## 2019-03-12 LAB — RENAL FUNCTION PANEL
Albumin: 2.2 g/dL — ABNORMAL LOW (ref 3.5–5.0)
Albumin: 2 g/dL — ABNORMAL LOW (ref 3.5–5.0)
Anion gap: 11 (ref 5–15)
Anion gap: 12 (ref 5–15)
BUN: 94 mg/dL — ABNORMAL HIGH (ref 8–23)
BUN: 64 mg/dL — ABNORMAL HIGH (ref 8–23)
CO2: 25 mmol/L (ref 22–32)
CO2: 25 mmol/L (ref 22–32)
Calcium: 8.2 mg/dL — ABNORMAL LOW (ref 8.9–10.3)
Calcium: 8.2 mg/dL — ABNORMAL LOW (ref 8.9–10.3)
Chloride: 102 mmol/L (ref 98–111)
Chloride: 102 mmol/L (ref 98–111)
Creatinine, Ser: 2.83 mg/dL — ABNORMAL HIGH (ref 0.44–1.00)
Creatinine, Ser: 1.98 mg/dL — ABNORMAL HIGH (ref 0.44–1.00)
GFR calc Af Amer: 19 mL/min — ABNORMAL LOW (ref >60)
GFR calc Af Amer: 29 mL/min — ABNORMAL LOW (ref >60)
GFR calc non Af Amer: 16 mL/min — ABNORMAL LOW (ref >60)
GFR calc non Af Amer: 25 mL/min — ABNORMAL LOW (ref >60)
Glucose, Bld: 221 mg/dL — ABNORMAL HIGH (ref 70–99)
Glucose, Bld: 139 mg/dL — ABNORMAL HIGH (ref 70–99)
Phosphorus: 5.1 mg/dL — ABNORMAL HIGH (ref 2.5–4.6)
Phosphorus: 3.7 mg/dL (ref 2.5–4.6)
Potassium: 4.9 mmol/L (ref 3.5–5.1)
Potassium: 4.7 mmol/L (ref 3.5–5.1)
Sodium: 138 mmol/L (ref 135–145)
Sodium: 139 mmol/L (ref 135–145)

## 2019-03-12 LAB — CBC WITH DIFFERENTIAL/PLATELET
Abs Immature Granulocytes: 1.44 10*3/uL — ABNORMAL HIGH (ref 0.00–0.07)
Basophils Absolute: 0.1 10*3/uL (ref 0.0–0.1)
Basophils Relative: 0 %
Eosinophils Absolute: 0 10*3/uL (ref 0.0–0.5)
Eosinophils Relative: 0 %
HCT: 26.8 % — ABNORMAL LOW (ref 36.0–46.0)
Hemoglobin: 8.3 g/dL — ABNORMAL LOW (ref 12.0–15.0)
Immature Granulocytes: 3 %
Lymphocytes Relative: 3 %
Lymphs Abs: 1.5 10*3/uL (ref 0.7–4.0)
MCH: 29 pg (ref 26.0–34.0)
MCHC: 31 g/dL (ref 30.0–36.0)
MCV: 93.7 fL (ref 80.0–100.0)
Monocytes Absolute: 4 10*3/uL — ABNORMAL HIGH (ref 0.1–1.0)
Monocytes Relative: 9 %
Neutro Abs: 39.9 10*3/uL — ABNORMAL HIGH (ref 1.7–7.7)
Neutrophils Relative %: 85 %
Platelets: 136 10*3/uL — ABNORMAL LOW (ref 150–400)
RBC: 2.86 MIL/uL — ABNORMAL LOW (ref 3.87–5.11)
RDW: 16.5 % — ABNORMAL HIGH (ref 11.5–15.5)
WBC: 46.9 10*3/uL — ABNORMAL HIGH (ref 4.0–10.5)
nRBC: 0.3 % — ABNORMAL HIGH (ref 0.0–0.2)

## 2019-03-12 LAB — BASIC METABOLIC PANEL
Anion gap: 13 (ref 5–15)
BUN: 94 mg/dL — ABNORMAL HIGH (ref 8–23)
CO2: 23 mmol/L (ref 22–32)
Calcium: 8.1 mg/dL — ABNORMAL LOW (ref 8.9–10.3)
Chloride: 101 mmol/L (ref 98–111)
Creatinine, Ser: 2.75 mg/dL — ABNORMAL HIGH (ref 0.44–1.00)
GFR calc Af Amer: 20 mL/min — ABNORMAL LOW (ref >60)
GFR calc non Af Amer: 17 mL/min — ABNORMAL LOW (ref >60)
Glucose, Bld: 222 mg/dL — ABNORMAL HIGH (ref 70–99)
Potassium: 4.9 mmol/L (ref 3.5–5.1)
Sodium: 137 mmol/L (ref 135–145)

## 2019-03-12 LAB — GLUCOSE, CAPILLARY
Glucose-Capillary: 132 mg/dL — ABNORMAL HIGH (ref 70–99)
Glucose-Capillary: 145 mg/dL — ABNORMAL HIGH (ref 70–99)
Glucose-Capillary: 155 mg/dL — ABNORMAL HIGH (ref 70–99)
Glucose-Capillary: 185 mg/dL — ABNORMAL HIGH (ref 70–99)
Glucose-Capillary: 217 mg/dL — ABNORMAL HIGH (ref 70–99)
Glucose-Capillary: 244 mg/dL — ABNORMAL HIGH (ref 70–99)
Glucose-Capillary: 261 mg/dL — ABNORMAL HIGH (ref 70–99)

## 2019-03-12 LAB — MAGNESIUM: Magnesium: 2.3 mg/dL (ref 1.7–2.4)

## 2019-03-12 LAB — APTT: aPTT: 72 seconds — ABNORMAL HIGH (ref 24–36)

## 2019-03-12 LAB — D-DIMER, QUANTITATIVE: D-Dimer, Quant: 5.98 ug/mL-FEU — ABNORMAL HIGH (ref 0.00–0.50)

## 2019-03-12 MED ORDER — PRO-STAT SUGAR FREE PO LIQD
60.0000 mL | Freq: Three times a day (TID) | ORAL | Status: DC
  Administered 2019-03-12 – 2019-03-17 (×17): 60 mL
  Filled 2019-03-12 (×16): qty 60

## 2019-03-12 MED ORDER — SODIUM CHLORIDE 0.9 % IV SOLN
INTRAVENOUS | Status: DC | PRN
  Administered 2019-03-12 – 2019-03-13 (×2): 250 mL via INTRAVENOUS

## 2019-03-12 MED ORDER — FLEET ENEMA 7-19 GM/118ML RE ENEM: 1.0000 | ENEMA | Freq: Every day | RECTAL | Status: DC | PRN

## 2019-03-12 MED ORDER — DARBEPOETIN ALFA 60 MCG/0.3ML IJ SOSY
60.0000 ug | PREFILLED_SYRINGE | INTRAMUSCULAR | Status: DC
  Administered 2019-03-12: 18:00:00 60 ug via SUBCUTANEOUS
  Filled 2019-03-12: qty 0.3

## 2019-03-12 MED ORDER — POLYETHYLENE GLYCOL 3350 17 G PO PACK
17.0000 g | PACK | Freq: Two times a day (BID) | ORAL | Status: DC
  Administered 2019-03-12 – 2019-03-17 (×9): 17 g
  Filled 2019-03-12 (×10): qty 1

## 2019-03-12 MED ORDER — VITAL HIGH PROTEIN PO LIQD
1000.0000 mL | ORAL | Status: DC
  Administered 2019-03-12 – 2019-03-17 (×5): 1000 mL
  Filled 2019-03-12 (×2): qty 1000

## 2019-03-12 MED ORDER — VANCOMYCIN HCL IN DEXTROSE 1-5 GM/200ML-% IV SOLN
1000.0000 mg | INTRAVENOUS | Status: DC
  Administered 2019-03-13 – 2019-03-16 (×4): 1000 mg via INTRAVENOUS
  Filled 2019-03-12 (×5): qty 200

## 2019-03-12 MED ORDER — BISACODYL 10 MG RE SUPP
10.0000 mg | Freq: Every day | RECTAL | Status: DC | PRN
  Administered 2019-03-12 – 2019-03-13 (×2): 10 mg via RECTAL
  Filled 2019-03-12 (×2): qty 1

## 2019-03-12 MED ORDER — VANCOMYCIN HCL 10 G IV SOLR
1750.0000 mg | Freq: Once | INTRAVENOUS | Status: AC
  Administered 2019-03-12: 1750 mg via INTRAVENOUS
  Filled 2019-03-12: qty 1750

## 2019-03-12 MED ORDER — INSULIN GLARGINE 100 UNIT/ML ~~LOC~~ SOLN
40.0000 [IU] | Freq: Two times a day (BID) | SUBCUTANEOUS | Status: DC
  Administered 2019-03-12 – 2019-03-15 (×6): 40 [IU] via SUBCUTANEOUS
  Filled 2019-03-12 (×8): qty 0.4

## 2019-03-12 MED ORDER — CHLORHEXIDINE GLUCONATE CLOTH 2 % EX PADS
6.0000 | MEDICATED_PAD | Freq: Every day | CUTANEOUS | Status: DC
  Administered 2019-03-11 – 2019-03-17 (×8): 6 via TOPICAL

## 2019-03-12 NOTE — Progress Notes (Signed)
PROGRESS NOTE  Nichole Franklin TZG:[id number] DOB: 12-06-1950 DOA: 01/31/2019  PCP: Elwyn Reach, MD  Brief History/Interval Summary: Patient is a 68 y.o. female with PMHx of DM-2, HTN, dyslipidemia, CKD stage IV who presented to the hospital initially for evaluation of weakness, she was found to have fever and acute kidney injury.  She was subsequently admitted to [facility] her renal function stabilized-she was transferred to [facility] course since admission has been complicated by encephalopathy, angioedema following initiation of Remdesivir-and respiratory failure requiring intubation on 7/4.  Reason for Visit: Acute respiratory disease due to COVID-19  Consultants: Pulmonology.  Nephrology  Procedures:  Intubation 7/4  Central line left IJ 7/4  Antibiotics: Anti-infectives (From admission, onward)   Start     Dose/Rate Route Frequency Ordered Stop   03/13/19 1300  vancomycin (VANCOCIN) IVPB 1000 mg/200 mL premix     1,000 mg 200 mL/hr over 60 Minutes Intravenous Every 24 hours 03/12/19 1151     03/12/19 1100  vancomycin (VANCOCIN) 1,750 mg in sodium chloride 0.9 % 500 mL IVPB     1,750 mg 250 mL/hr over 120 Minutes Intravenous  Once 03/12/19 1056 03/12/19 1500   03/11/19 2230  ceFEPIme (MAXIPIME) 2 g in sodium chloride 0.9 % 100 mL IVPB     2 g 200 mL/hr over 30 Minutes Intravenous Every 12 hours 03/11/19 2150     03/07/19 1000  ceFEPIme (MAXIPIME) 2 g in sodium chloride 0.9 % 100 mL IVPB  Status:  Discontinued     2 g 200 mL/hr over 30 Minutes Intravenous Every 24 hours 03/06/19 1434 03/11/19 2150   03/04/19 2200  ceFEPIme (MAXIPIME) 1 g in sodium chloride 0.9 % 100 mL IVPB  Status:  Discontinued     1 g 200 mL/hr over 30 Minutes Intravenous Every 12 hours 03/04/19 1256 03/06/19 1434   03/04/19 1400  remdesivir 100 mg in sodium chloride 0.9 % 250 mL IVPB  Status:  Discontinued     100 mg 500 mL/hr over 30 Minutes Intravenous Every 24  hours 03/03/19 1305 03/04/19 0936   03/04/19 1200  linezolid (ZYVOX) IVPB 600 mg  Status:  Discontinued     600 mg 300 mL/hr over 60 Minutes Intravenous Every 12 hours 03/04/19 1141 03/05/19 1100   03/04/19 0900  linezolid (ZYVOX) IVPB 600 mg  Status:  Discontinued     600 mg 300 mL/hr over 60 Minutes Intravenous Every 12 hours 03/04/19 0759 03/04/19 1141   03/04/19 0800  ceFEPIme (MAXIPIME) 2 g in sodium chloride 0.9 % 100 mL IVPB  Status:  Discontinued     2 g 200 mL/hr over 30 Minutes Intravenous Every 12 hours 03/04/19 0706 03/04/19 1256   03/03/19 1400  remdesivir 200 mg in sodium chloride 0.9 % 250 mL IVPB     200 mg 500 mL/hr over 30 Minutes Intravenous Once 03/03/19 1305 03/03/19 1500   02/27/19 2000  azithromycin (ZITHROMAX) 500 mg in sodium chloride 0.9 % 250 mL IVPB  Status:  Discontinued     500 mg 250 mL/hr over 60 Minutes Intravenous Every 24 hours 02/27/19 0428 03/04/19 1211   02/27/19 0430  cefTRIAXone (ROCEPHIN) 1 g in sodium chloride 0.9 % 100 mL IVPB  Status:  Discontinued     1 g 200 mL/hr over 30 Minutes Intravenous Every 24 hours 02/27/19 0428 03/04/19 0706       Subjective/Interval History: Patient remains intubated and sedated.  Hypothermic overnight.  However had  fever yesterday evening.  Drop in blood pressure also noted.  Increase in white blood cell count    Assessment/Plan:  Acute Hypoxic Resp. Failure due to Acute Covid 19 Viral Illness  Vent Mode: PRVC FiO2 (%):  [30 %] 30 % Set Rate:  [14 bmp] 14 bmp Vt Set:  [470 mL] 470 mL PEEP:  [5 cmH20] 5 cmH20 Plateau Pressure:  [21 cmH20-26 cmH20] 21 cmH20     Component Value Date/Time   PHART 7.325 (L) 03/11/2019 1539   PCO2ART 39.1 03/11/2019 1539   PO2ART 73.0 (L) 03/11/2019 1539   HCO3 20.1 03/11/2019 1539   TCO2 21 (L) 03/11/2019 1539   ACIDBASEDEF 5.0 (H) 03/11/2019 1539   O2SAT 92.0 03/11/2019 1539    COVID-19 Labs  Recent Labs    03/10/19 0445 03/11/19 0500 03/12/19 0530  DDIMER  8.87* 6.95* 5.98*    Lab Results  Component Value Date   SARSCOV2NAA POSITIVE (A) 01/30/2019     Fever: T-max of 101.5 F yesterday evening.  And then patient became hypothermic at 95.5. Oxygen requirements: Mechanical ventilation.  30% FiO2.  Saturating in the 90s.   Antibiotics: Patient has been on cefepime, day 9.  Vancomycin was added today.   Remdesivir: Patient developed angioedema to Remdesivir Steroids: She has been taken off of steroids due to encephalopathy Diuretics: She has received furosemide during this hospitalization. last dose was on 7/9 Actemra: Did not receive Convalescent Plasma: Did not receive Vitamin C and Zinc: Continue DVT Prophylaxis: On heparin 7500 units subcutaneously every 8 hours  Patient was initially intubated due to angioedema thought to be secondary to Remdesivir.  Angioedema resolved.  Patient was extubated on 7/8.  She did have some stridor at the same evening which was treated with nebulizer treatments and steroids.  This improved.  However her mentation continued to get worse.  She was unable to protect her airway.  She was reintubated on 7/10.   From a COVID-19 standpoint she appears to be stable.  Patient noted to be febrile yesterday evening and then hypothermic this morning.  Also became hypotensive.  Concern is for sepsis.  Check lactic acid levels and procalcitonin levels.  Vancomycin has been added to her antibiotic regimen.  Continue cefepime.   Acute metabolic encephalopathy She had encephalopathy earlier during the course of the hospitalization thought to be due to hypoxemia and other acute metabolic derangement.  CT scan done on 7/4 did not show any acute findings.  After extubation her mentation has been progressively getting worse.  She had a CT scan repeated on 7/10 which did not show any acute findings.  She had to be reintubated as she was not able to protect her airway.  Mentation could also be altered due to uremia.  Steroids were  discontinued.  Now she is on mechanical ventilation.  Stable for the most part.  Septic shock Requiring pressors.  Vancomycin added.  Check lactic acid and procalcitonin levels in the morning.  Hold blood pressure lowering agents.  Acute kidney injury on chronic kidney disease stage IV Baseline creatinine around 1.6-2.  Presented with a BUN of 81 and a creatinine of 7.09.  Renal function had improved.  Renal function again started getting worse.  Her urine output dropped.  Nephrology was consulted.  Dialysis catheter was placed.  Patient was started on CRRT.   Elevated d-dimer Patient was empirically started on IV heparin on 7/4 due to concern for VTE.  Due to drop in hemoglobin this was discontinued.  Lower extremity Doppler study was negative for DVT.  Clinically there was low suspicion for VTE.  D-dimer continues to trend down. Continue high-dose subcutaneous heparin for now.    Leukocytosis Significant increase in WBC noted today.  See above.  Vancomycin added to antibiotic regimen.  We will check procalcitonin and lactic acid levels tomorrow.    Angioedema Most likely secondary to Remdesivir.  Tongue swelling has improved.  Hypernatremia Resolved with free water.  Monitor electrolytes closely.  Diabetes mellitus type 2 with uncontrolled hyperglycemia No longer on insulin infusion.  Patient is on Lantus insulin and SSI.  HbA1c 9.3.  And his dose was increased recently.  Will further titrate the dose.  Continue SSI.    Normocytic anemia  Patient has required transfusion of 2 PRBCs.  First on 7/6 and then another 1 on 7/11.  Hemoglobin has responded appropriately to transfusion yesterday.    Nutrition Being fed through a feeding tube.    DVT Prophylaxis: Subcutaneous heparin PUD Prophylaxis: Protonix Code Status: Full code Family Communication: Pulmonology has been discussing with patient's family. Disposition Plan: Remain in ICU for now   Medications:  Scheduled:   chlorhexidine gluconate (MEDLINE KIT)  15 mL Mouth Rinse BID   Chlorhexidine Gluconate Cloth  6 each Topical Q0600   cholecalciferol  1,000 Units Per Tube Daily   darbepoetin (ARANESP) injection - NON-DIALYSIS  60 mcg Subcutaneous Q Sun-1800   feeding supplement (PRO-STAT SUGAR FREE 64)  60 mL Per Tube TID   ferrous sulfate  300 mg Per Tube BID WC   free water  300 mL Per Tube Q4H   heparin injection (subcutaneous)  7,500 Units Subcutaneous Q8H   insulin aspart  0-20 Units Subcutaneous Q4H   insulin glargine  35 Units Subcutaneous BID   ipratropium-albuterol  3 mL Nebulization Q6H   mouth rinse  15 mL Mouth Rinse 10 times per day   pantoprazole sodium  40 mg Per Tube Daily   polyethylene glycol  17 g Per Tube BID   senna  2 tablet Per Tube QHS   sodium chloride flush  10-40 mL Intracatheter Q12H   Continuous:   prismasol BGK 4/2.5 400 mL/hr at 03/12/19 1000    prismasol BGK 4/2.5 200 mL/hr at 03/11/19 2054   sodium chloride Stopped (03/12/19 1300)   ceFEPime (MAXIPIME) IV Stopped (03/12/19 1056)   feeding supplement (VITAL HIGH PROTEIN) 1,000 mL (03/12/19 1313)   fentaNYL infusion INTRAVENOUS 25 mcg/hr (03/12/19 1400)   heparin 10,000 units/ 20 mL infusion syringe 500 Units/hr (03/12/19 1400)   norepinephrine (LEVOPHED) Adult infusion Stopped (03/12/19 1001)   prismasol BGK 4/2.5 1,800 mL/hr at 03/12/19 1152   [START ON 03/13/2019] vancomycin     MBE:MLJQGB chloride, acetaminophen **OR** acetaminophen, alteplase, bisacodyl, dextrose, EPINEPHrine, fentaNYL (SUBLIMAZE) injection, fentaNYL (SUBLIMAZE) injection, guaiFENesin-dextromethorphan, heparin, heparin, hydrALAZINE, lip balm, midazolam, morphine injection, ondansetron **OR** ondansetron (ZOFRAN) IV, sodium chloride, sodium chloride flush, sodium phosphate   Objective:  Vital Signs  Vitals:   03/12/19 1345 03/12/19 1400 03/12/19 1415 03/12/19 1442  BP: (!) 106/58 (!) 122/51 (!) 128/49   Pulse: 92 89 91    Resp: (!) 23 (!) 22 (!) 23   Temp:      TempSrc:      SpO2: 92% 91% 92% 92%  Weight:      Height:        Intake/Output Summary (Last 24 hours) at 03/12/2019 1500 Last data filed at 03/12/2019 1400 Gross per 24 hour  Intake 4852.89 ml  Output 4930 ml  Net -77.11 ml   Filed Weights   03/10/19 0411 03/11/19 0500 03/12/19 0500  Weight: 80.9 kg 82.4 kg 82.3 kg    General appearance: Intubated and sedated Resp: Coarse breath sounds bilaterally.  No wheezing rales or rhonchi  Cardio: S1-S2 is normal regular.  No S3-S4.  No rubs murmurs or bruit GI: Abdomen is soft.  Nontender nondistended.  Bowel sounds are present normal.  No masses organomegaly Extremities: Very minimal edema.  Neurologic: Sedated.    Lab Results:  Data Reviewed: I have personally reviewed following labs and imaging studies  CBC: Recent Labs  Lab 03/09/19 0518 03/10/19 0445  03/10/19 2329 03/11/19 0500 03/11/19 1539 03/11/19 1900 03/12/19 0530  WBC 43.4* 44.4*  --   --  39.0*  --  39.6* 46.9*  NEUTROABS  --  38.4*  --   --  32.9*  --   --  39.9*  HGB 8.5* 7.7*   < > 8.2* 6.8* 6.1* 8.1* 8.3*  HCT 27.1* 24.6*   < > 24.0* 22.2* 18.0* 25.4* 26.8*  MCV 88.9 90.4  --   --  92.5  --  91.4 93.7  PLT 195 181  --   --  156  --  139* 136*   < > = values in this interval not displayed.    Basic Metabolic Panel: Recent Labs  Lab 03/05/19 1700  03/09/19 0518 03/10/19 0445  03/10/19 2329 03/11/19 0500 03/11/19 1539 03/11/19 1900 03/12/19 0530  NA  --    < > 144 140   < > 140 143 139 140 138   137  K  --    < > 3.8 3.9   < > 4.2 3.8 4.5 4.7 4.9   4.9  CL  --    < > 108 107  --   --  108  --  106 102   101  CO2  --    < > 22 20*  --   --  22  --  '22 25   23  '$ GLUCOSE  --    < > 164* 360*  --   --  153*  --  155* 221*   222*  BUN  --    < > 93* 102*  --   --  141*  --  110* 94*   94*  CREATININE  --    < > 2.99* 3.07*  --   --  3.75*  --  4.14* 2.83*   2.75*  CALCIUM  --    < > 8.6* 8.2*  --   --  8.2*   --  8.0* 8.2*   8.1*  MG 2.6*  --   --   --   --   --   --   --   --  2.3  PHOS 3.7  --   --   --   --   --   --   --  6.4* 5.1*   < > = values in this interval not displayed.    GFR: Estimated Creatinine Clearance: 21.2 mL/min (A) (by C-G formula based on SCr of 2.75 mg/dL (H)).  Liver Function Tests: Recent Labs  Lab 03/06/19 0430 03/07/19 0425 03/08/19 0400 03/11/19 0500 03/11/19 1900 03/12/19 0530  AST 44* 118* 35 32  --   --   ALT 44 80* 65* 48*  --   --   ALKPHOS 154* 226* 192* 146*  --   --  BILITOT 0.3 0.4 0.6 0.3  --   --   PROT 5.9* 6.8 6.2* 6.0*  --   --   ALBUMIN 2.5* 2.8* 2.6* 2.1* 2.0* 2.2*     CBG: Recent Labs  Lab 03/11/19 2032 03/12/19 0030 03/12/19 0347 03/12/19 0809 03/12/19 1154  GLUCAP 161* 185* 217* 244* 261*      Recent Results (from the past 240 hour(s))  Culture, respiratory (non-expectorated)     Status: None   Collection Time: 03/04/19 11:41 AM   Specimen: Tracheal Aspirate; Respiratory  Result Value Ref Range Status   Specimen Description   Final    TRACHEAL ASPIRATE Performed at [facility] [address] [place], [place] [address]    Special Requests   Final    NONE Performed at [facility] [place] [address], [place], [place] [address]    Gram Stain   Final    RARE WBC PRESENT, PREDOMINANTLY PMN MODERATE SQUAMOUS EPITHELIAL CELLS PRESENT ABUNDANT GRAM POSITIVE COCCI    Culture   Final    MODERATE Consistent with normal respiratory flora. Performed at [facility], [place] [address], [place], [place] [address]    Report Status 03/07/2019 FINAL  Final  MRSA PCR Screening     Status: None   Collection Time: 03/04/19  7:08 PM   Specimen: Nasal Mucosa; Nasopharyngeal  Result Value Ref Range Status   MRSA by PCR NEGATIVE NEGATIVE Final    Comment:        The GeneXpert MRSA Assay (FDA approved for NASAL specimens only), is one component of a comprehensive MRSA  colonization surveillance program. It is not intended to diagnose MRSA infection nor to guide or monitor treatment for MRSA infections. Performed at [facility], [place] [address] [place], [place] [address]   Culture, blood (routine x 2)     Status: None (Preliminary result)   Collection Time: 03/11/19 10:18 PM   Specimen: BLOOD  Result Value Ref Range Status   Specimen Description BLOOD RIGHT ANTECUBITAL  Final   Special Requests   Final    BOTTLES DRAWN AEROBIC AND ANAEROBIC Blood Culture adequate volume Performed at [facility], [place] [address], [place], [place] [address]    Culture PENDING  Incomplete   Report Status PENDING  Incomplete  Culture, blood (routine x 2)     Status: None (Preliminary result)   Collection Time: 03/11/19 10:23 PM   Specimen: BLOOD  Result Value Ref Range Status   Specimen Description BLOOD RIGHT ANTECUBITAL  Final   Special Requests   Final    BOTTLES DRAWN AEROBIC AND ANAEROBIC Blood Culture adequate volume Performed at [facility], [place] [address], [place], [place] [address]    Culture PENDING  Incomplete   Report Status PENDING  Incomplete      Radiology Studies: Dg Chest 1 View  Result Date: 03/10/2019 CLINICAL DATA:  Evaluate ETT placement EXAM: CHEST  1 VIEW COMPARISON:  March 09, 2019 FINDINGS: The ETT terminates 1.8 cm above the carina. The feeding tube terminates below today's film. A left central line is stable in appropriate position. No pneumothorax. Hazy opacities in the lungs, left greater than right are stable. The cardiomediastinal silhouette is stable. IMPRESSION: 1. The ETT terminates 1.8 cm above the carina. Recommend withdrawing 1 cm. Other support apparatus as above. 2. Mild hazy opacity remains in the lungs, particularly the left lung. This could represent atypical infection or asymmetric pulmonary edema. Electronically Signed  By: Dorise Bullion III M.D   On: 03/10/2019 18:33   US Renal  Result  Date: 03/11/2019 CLINICAL DATA:  Acute renal failure EXAM: RENAL / URINARY TRACT ULTRASOUND COMPLETE COMPARISON:  None. FINDINGS: Right Kidney: Renal measurements: 10.2 x 5.1 x 5.2 cm = volume: 141.4 mL. Increased cortical echogenicity Left Kidney: Renal measurements: 8.8 x 5.3 x 5.6 cm = volume: 136.1 mL. Increased cortical echogenicity. Limited evaluation. Bladder: Not visualized on today's study. IMPRESSION: 1. The bladder was not visualized on today's study. 2. Evaluation of the left kidney is limited. Within this limitation, the kidneys demonstrate increased cortical echogenicity suggesting medical renal disease. No hydronephrosis noted. Electronically Signed   By: Dorise Bullion III M.D   On: 03/11/2019 16:36   Dg Chest Port 1 View  Result Date: 03/11/2019 CLINICAL DATA:  Central line placement EXAM: PORTABLE CHEST 1 VIEW COMPARISON:  March 10, 2019 FINDINGS: A new right central line terminates in the central SVC. No pneumothorax. The left central line is stable probably terminating in the left brachiocephalic vein. The ETT is in good position. The NG tube terminates below today's film. Cardiomediastinal silhouette is stable. Hazy opacities in the lungs, left greater than right, are stable. IMPRESSION: 1. Support apparatus as above. New right central line in good position. No pneumothorax. 2. Patchy bilateral opacities, left greater than right, worrisome for an infectious process. Electronically Signed   By: Dorise Bullion III M.D   On: 03/11/2019 17:11   [place] Ekg Site Rite  Result Date: 03/11/2019 If Site Rite image not attached, placement could not be confirmed due to current cardiac rhythm.      LOS: 14 days   Nichole Franklin [employer] on [email or web address]  03/12/2019, 3:00 PM

## 2019-03-12 NOTE — Progress Notes (Signed)
Assisted patient's daughter, [name] with facetime video chat.

## 2019-03-12 NOTE — Progress Notes (Signed)
[facility] Progress Note  Subjective: on vent, not responsive, +2.4 L yest, 1.5 L in and 1.5 L out so far today  Vitals:   03/12/19 1330 03/12/19 1345 03/12/19 1400 03/12/19 1415  BP: 122/61 (!) 106/58 (!) 122/51 (!) 128/49  Pulse: 92 92 89 91  Resp: (!) 23 (!) 23 (!) 22 (!) 23  Temp:      TempSrc:      SpO2: 93% 92% 91% 92%  Weight:      Height:        Inpatient medications: . chlorhexidine gluconate (MEDLINE KIT)  15 mL Mouth Rinse BID  . Chlorhexidine Gluconate Cloth  6 each Topical Q0600  . cholecalciferol  1,000 Units Per Tube Daily  . feeding supplement (PRO-STAT SUGAR FREE 64)  60 mL Per Tube TID  . ferrous sulfate  300 mg Per Tube BID WC  . free water  300 mL Per Tube Q4H  . heparin injection (subcutaneous)  7,500 Units Subcutaneous Q8H  . insulin aspart  0-20 Units Subcutaneous Q4H  . insulin glargine  35 Units Subcutaneous BID  . ipratropium-albuterol  3 mL Nebulization Q6H  . mouth rinse  15 mL Mouth Rinse 10 times per day  . pantoprazole sodium  40 mg Per Tube Daily  . polyethylene glycol  17 g Per Tube BID  . senna  2 tablet Per Tube QHS  . sodium chloride flush  10-40 mL Intracatheter Q12H   .  prismasol BGK 4/2.5 400 mL/hr at 03/12/19 1000  .  prismasol BGK 4/2.5 200 mL/hr at 03/11/19 2054  . sodium chloride Stopped (03/12/19 1300)  . ceFEPime (MAXIPIME) IV Stopped (03/12/19 1056)  . feeding supplement (VITAL HIGH PROTEIN) 1,000 mL (03/12/19 1313)  . fentaNYL infusion INTRAVENOUS 25 mcg/hr (03/12/19 1400)  . heparin 10,000 units/ 20 mL infusion syringe 500 Units/hr (03/12/19 1400)  . norepinephrine (LEVOPHED) Adult infusion Stopped (03/12/19 1001)  . prismasol BGK 4/2.5 1,800 mL/hr at 03/12/19 1152  . vancomycin 250 mL/hr at 03/12/19 1400  . [START ON 03/13/2019] vancomycin     sodium chloride, acetaminophen **OR** acetaminophen, alteplase, bisacodyl, dextrose, EPINEPHrine, fentaNYL (SUBLIMAZE) injection, fentaNYL (SUBLIMAZE) injection,  guaiFENesin-dextromethorphan, heparin, heparin, hydrALAZINE, lip balm, midazolam, morphine injection, ondansetron **OR** ondansetron (ZOFRAN) IV, sodium chloride, sodium chloride flush, sodium phosphate    Exam: Seen in ICU  on vent, sedated, not responding  no jvd  Chest cta ant/ lat, no wheezing or rhonchi  Cor  Regular  Abd obese, no ascites or hsm  ext minimal hip edema, no other edema  Neuro - not responding   Home meds:  - lisinopril 20 qd/ hydrochlorothiazide 25 qd  - novolin 70/30 30 bid/ metformin 500 bid  - metoclopramide 10 qid/ sucralfate 1g qid  - duloxetine 30 qd/ gabapentin 300 tid/ vicodin qid prn  - naproxen 440 bid prn/ pravastatin 80 hs   UNa 76, UCr 58  (02/27/19)   Date                            Creat               eGFR  2009- 11/2016              1.3- 1.8            32- 50  CKD III  05/2017  2.61                 21  CKD IV  02/05/2019- 03/11/19         7.09 >> 4.14    Renal US >  8.8/ 10 cm kidneys, ^echogenicity c/w CKD, no hydro  CXR 7/11 > Patchy bilateral opacities, left greater than right, worrisome for an infectious process  total I/O 6/29 - 7/10 = 28L in and 27.8 L out, +365  admit wt = 77kg, peak 82kg   FeSat 4% on 6/29, ferr 244   Assessment/ Plan: 1. AKI on CKDIV - baseline creat around 2.5 from 2018 which was CKD IV eGFR 54m/min.  Echogenic kidneys on [place] go along w/ advanced renal failure chronically.  Creat 7 on admission due to dehydration, improved to 2.9 then rose to 4.0 and started CRRT on 7/11.  Plan cont CRRT for now, heparin 500u/hr flat rate. Any uremic symptoms should resolve within 3-4 days on RRT approx.  2. Volume - no CVP available, CXR's not helpful w/ bilat infiltrates. Up 6kg by wt's from admission, no sig edema on exam. On low dose pressors , keeping even.  3. DM2 on insulin 4. COVID-19 PNA / vent dep resp failure: per primary 5. Anemia ckd + blood draws+ other: possibly CKD related in part, Hb 9 on  admit and 8's now.  Low Fe sat 6/29. Will start esa and IV Fe w/ CKD IV. Transfuse prn.    [facility] 03/12/2019, 2:27 PM  Iron/TIBC/Ferritin/ %Sat    Component Value Date/Time   IRON 14 (L) 02/27/2019 0215   TIBC 350 02/27/2019 0215   FERRITIN 154 03/08/2019 0400   IRONPCTSAT 4 (L) 02/27/2019 0215   Recent Labs  Lab 03/12/19 0530  NA 138  137  K 4.9  4.9  CL 102  101  CO2 25  23  GLUCOSE 221*  222*  BUN 94*  94*  CREATININE 2.83*  2.75*  CALCIUM 8.2*  8.1*  PHOS 5.1*  ALBUMIN 2.2*   Recent Labs  Lab 03/11/19 0500  AST 32  ALT 48*  ALKPHOS 146*  BILITOT 0.3  PROT 6.0*   Recent Labs  Lab 03/12/19 0530  WBC 46.9*  HGB 8.3*  HCT 26.8*  PLT 136*

## 2019-03-12 NOTE — Progress Notes (Signed)
NAME:  Nichole Franklin, MRN:  [id number], DOB:  01-Feb-1951, LOS: 77 ADMISSION DATE:  02/18/2019, CONSULTATION DATE:  7/4 REFERRING MD:  Aileen Fass, CHIEF COMPLAINT:  Found confused, tongue swelling   Brief History   68 y/o female with extensive PMH admitted June 28 in the setting of a new COVID 19 diagnosis.  Admitted to [facility] due to renal failure which improved with IV fluids.  Transferred to [facility].  On hospital day 6 she developed worsening confusion and tongue swelling felt to be a reaction to remdesivir; then was transferred to the ICU emergently for intubation.   Past Medical History  Hypertension Hyperlipidemia Diabetes mellitus Gastroesophageal reflux disease History of cervical cancer Anxiety  Significant Hospital Events   June 28 admission [facility] from [facility] for AKI, poor po intake June 30 transfer to [facility], confusion July 2: worsening hypoxemia July 3 remdesivir, worsening confusion July 4 tongue swelling, hypertension, severe encephalopathy, transfer to ICU, intubation, new worsening fever - Admitted to the ICU for worsening confusion, required emergent intubation Tongue swelling noted July 5 > July 7 tongue swelling removed, awake, not extubated July 8 extubated, some stridor July 9 mild increased work of breathing, steroids added, did not sleep the night before, trazodone added, mild delirium July 10 worsening delirium, Intubated for inability to protect airway  July 11 worsening renal function, started on CVVHD July 12 worsening hypotension overnight fever  Consults:  Nephrology, signed off then reconsulted on 7/11 PCCM  Procedures:  July 4 left IJ CVL July 4 endotracheal tube July 8 Cor-Trak gastric tube July 11 right internal jugular hemodialysis catheter  Significant Diagnostic Tests:  June 28 CT scan abdomen pelvis for nephrolithiasis: No explanation of renal failure, no urinary calculus, extensive multifocal groundglass opacity in  lungs, sclerotic inferior endplate deformities of T11 and T12 July 4 CT Head > pending  Micro Data:  7/4 resp culture > OPF July 11 blood cultures  Antimicrobials:  6/28 ceftriaxone/azithro > July 3 7/3 cefepime >  7/4 Linezolid > 7/5   Interim history/subjective:   Has tolerated CVVHD without difficulty. More hypotensive overnight Fever overnight  Objective   Blood pressure (!) 135/48, pulse 77, temperature 97.9 F (36.6 C), temperature source Oral, resp. rate 11, height 5\' 6"  (1.676 m), weight 82.3 kg, SpO2 94 %.    Vent Mode: PRVC FiO2 (%):  [30 %] 30 % Set Rate:  [14 bmp] 14 bmp Vt Set:  [470 mL] 470 mL PEEP:  [5 cmH20] 5 cmH20 Plateau Pressure:  [21 cmH20-26 cmH20] 26 cmH20   Intake/Output Summary (Last 24 hours) at 03/12/2019 0759 Last data filed at 03/12/2019 0700 Gross per 24 hour  Intake 6070.46 ml  Output 3651 ml  Net 2419.46 ml   Filed Weights   03/10/19 0411 03/11/19 0500 03/12/19 0500  Weight: 80.9 kg 82.4 kg 82.3 kg    Examination:  General:  In bed on vent HENT: NCAT ETT in place PULM: CTA B, vent supported breathing CV: RRR, no mgr GI: BS+, soft, nontender MSK: normal bulk and tone Neuro: sedated on vent   7/11 CXR images personally reviewed> likely interstitial edema bilaterally, ETT in place   Resolved Hospital Problem list   Angioedema due to remdesivir, requirement for intubation> resolved Mild stridor 7/9> resolved  Assessment & Plan:  Acute encephalopathy> persistent Insomnia/ICU delirium Neuromuscular weakness Uremia Frequent orientation Minimize sedation Passive range of motion exercises in bed Lights on during daytime, off at night. Hope to see some improvement after  3-4 days on CVVHD  COVID 19 pneumonia> improved, CXR normal, oxygenation OK Worsening acute on chronic renal failure Discussed with Dr. Jonnie Finner today at bedside Continue CVVHD  Acute respiratory failure with hypoxemia due to inability to protect airway:  Continue full mechanical ventilatory support via ventilator stabilizing protocol Full mechanical ventilatory support Monitor SaO2, titrate PEEP and FiO2 per standard protocol Minimize sedation, RA SS target 0 to -1, fentanyl infusion, PRN Versed  Fever 7/4> due to COVID Elevated WBC Hypotension?  Sepsis PICC Follow-up blood culture sent last night Start broad-spectrum antibiotics: Cefepime vancomycin  Anemia of CKD: no evidence of bleeding Monitor for bleeding Transfuse PRBC for Hgb < 7 gm/dL    Best practice:  Diet: start tube feeding Pain/Anxiety/Delirium protocol (if indicated): n/a VAP protocol (if indicated): n/a  DVT prophylaxis: sub q heparin GI prophylaxis: famotidine Glucose control: SSI Mobility: bed rest Code Status: full Family Communication: I updated her daughter [name] at length on 7/12 Disposition: remain in ICU  Labs   CBC: Recent Labs  Lab 03/08/19 0400 03/09/19 0518 03/10/19 0445 03/10/19 1150 03/10/19 2329 03/11/19 0500 03/11/19 1539 03/11/19 1900  WBC 35.7* 43.4* 44.4*  --   --  39.0*  --  39.6*  NEUTROABS  --   --  38.4*  --   --  32.9*  --   --   HGB 7.9* 8.5* 7.7* 8.2* 8.2* 6.8* 6.1* 8.1*  HCT 25.4* 27.1* 24.6* 24.0* 24.0* 22.2* 18.0* 25.4*  MCV 88.8 88.9 90.4  --   --  92.5  --  91.4  PLT 195 195 181  --   --  156  --  139*    Basic Metabolic Panel: Recent Labs  Lab 03/05/19 1700  03/08/19 0400 03/09/19 0518 03/10/19 0445 03/10/19 1150 03/10/19 2329 03/11/19 0500 03/11/19 1539 03/11/19 1900  NA  --    < > 142 144 140 139 140 143 139 140  K  --    < > 3.8 3.8 3.9 4.0 4.2 3.8 4.5 4.7  CL  --    < > 109 108 107  --   --  108  --  106  CO2  --    < > 23 22 20*  --   --  22  --  22  GLUCOSE  --    < > 182* 164* 360*  --   --  153*  --  155*  BUN  --    < > 97* 93* 102*  --   --  141*  --  110*  CREATININE  --    < > 2.90* 2.99* 3.07*  --   --  3.75*  --  4.14*  CALCIUM  --    < > 8.3* 8.6* 8.2*  --   --  8.2*  --  8.0*  MG  2.6*  --   --   --   --   --   --   --   --   --   PHOS 3.7  --   --   --   --   --   --   --   --  6.4*   < > = values in this interval not displayed.   GFR: Estimated Creatinine Clearance: 14.1 mL/min (A) (by C-G formula based on SCr of 4.14 mg/dL (H)). Recent Labs  Lab 03/09/19 0518 03/10/19 0445 03/11/19 0500 03/11/19 1900  PROCALCITON  --  2.45 2.65  --   WBC 43.4* 44.4*  39.0* 39.6*    Liver Function Tests: Recent Labs  Lab 03/06/19 0430 03/07/19 0425 03/08/19 0400 03/11/19 0500 03/11/19 1900  AST 44* 118* 35 32  --   ALT 44 80* 65* 48*  --   ALKPHOS 154* 226* 192* 146*  --   BILITOT 0.3 0.4 0.6 0.3  --   PROT 5.9* 6.8 6.2* 6.0*  --   ALBUMIN 2.5* 2.8* 2.6* 2.1* 2.0*   No results for input(s): LIPASE, AMYLASE in the last 168 hours. Recent Labs  Lab 03/11/19 0500  AMMONIA 45*    ABG    Component Value Date/Time   PHART 7.325 (L) 03/11/2019 1539   PCO2ART 39.1 03/11/2019 1539   PO2ART 73.0 (L) 03/11/2019 1539   HCO3 20.1 03/11/2019 1539   TCO2 21 (L) 03/11/2019 1539   ACIDBASEDEF 5.0 (H) 03/11/2019 1539   O2SAT 92.0 03/11/2019 1539     Coagulation Profile: No results for input(s): INR, PROTIME in the last 168 hours.  Cardiac Enzymes: No results for input(s): CKTOTAL, CKMB, CKMBINDEX, TROPONINI in the last 168 hours.  HbA1C: Hgb A1c MFr Bld  Date/Time Value Ref Range Status  02/27/2019 02:15 AM 9.3 (H) 4.8 - 5.6 % Final    Comment:    (NOTE) Pre diabetes:          5.7%-6.4% Diabetes:              >6.4% Glycemic control for   <7.0% adults with diabetes   08/05/2010 05:10 AM (H) <5.7 % Final   8.0 (NOTE)                                                                       According to the ADA Clinical Practice Recommendations for 2011, when HbA1c is used as a screening test:   >=6.5%   Diagnostic of Diabetes Mellitus           (if abnormal result  is confirmed)  5.7-6.4%   Increased risk of developing Diabetes Mellitus  References:Diagnosis and  Classification of Diabetes Mellitus,Diabetes AUQJ,3354,56(YBWLS 1):S62-S69 and Standards of Medical Care in         Diabetes - 2011,Diabetes Care,2011,34  (Suppl 1):S11-S61.    CBG: Recent Labs  Lab 03/11/19 1130 03/11/19 1607 03/11/19 2032 03/12/19 0030 03/12/19 0347  GLUCAP 150* 152* 161* 185* 217*       Critical care time: 35 minutes    Roselie Awkward, MD [place] P[facility]M Pager: [phone] Cell: [phone] If no response, call [phone]

## 2019-03-12 NOTE — Progress Notes (Signed)
Nutrition Follow-up  RD working remotely.   DOCUMENTATION CODES:   Not applicable  INTERVENTION:  - will adjust TF regimen based on re-intubation and initiation of CRRT. - will order Vital High Protein @ 45 ml/hr with 60 ml prostat TID which will provide 1680 kcal, 184 grams protein, and 903 ml free water. - free water flush to be per MD/NP/Nephrology.   NUTRITION DIAGNOSIS:   Inadequate oral intake related to inability to eat as evidenced by NPO status. -ongoing  GOAL:   Patient will meet greater than or equal to 90% of their needs -unmet for protein with current TF regimen  MONITOR:   Vent status, TF tolerance, Labs, Weight trends, I & O's  REASON FOR ASSESSMENT:   Ventilator  ASSESSMENT:   68 year-old female with medical history of type 2 DM, HTN, dyslipidemia, and CKD stage 4 who presented to the hospital initially for evaluation of weakness. She was then found to have fever and AKI and subsequently admitted to [facility], where she was found to be COVID-19 positive. Once her renal function stabilized, she was transferred to [facility] course since admission has been complicated by encephalopathy, angioedema following initiation of Remdesivir-and respiratory failure requiring intubation on 7/4.  Weight -5.6 kg/12 lb compared to admission (6/28) weight. Used admission weight of 76.7 kg to re-estimate nutrition needs this AM. Patient was extubated on 7/8 and subsequently required re-intubation on 7/10 at ~6:15 PM. Cortrak remains in place and order in place for Jevity 1.2 @ 55 ml/hr with 30 ml prostat BID and 300 ml free water every 4 hours. This regimen is providing 1784 kcal, 103 grams protein, and 2865 ml free water. Will adjust TF regimen as outlined above. Patient started on CRRT on 7/11 and continues at this time.    Patient is currently intubated on ventilator support MV: 5.7 L/min Temp (24hrs), Avg:99 F (37.2 C), Min:95.5 F (35.3 C),  Max:101.5 F (38.6 C) Propofol: none   Medications reviewed; 1000 unit vitamin D3 per tube/day, 300 mg ferrous sulfate per tube BID, sliding scale novolog, 35 units lantus BID, 1 packet miralax/day, 2 tablets senokot/day Labs reviewed; CBGs: 185, 217, and 244 mg/dl today, BUN: 94 mg/dl, creatinine: 2.75 mg/dl, Ca: 8.1 mg/dl, Phos: 5.1 mg/dl, GFR: 20 ml/min.  Drips; fentanyl @ 50 mcg/hr, levo @ 6 mcg/min, heparin @ 500 units/hr.     NUTRITION - FOCUSED PHYSICAL EXAM:  unable to complete while patient is at [place]:   Diet Order            Diet NPO time specified  Diet effective now              EDUCATION NEEDS:   Not appropriate for education at this time  Skin:  Skin Assessment: Reviewed RN Assessment  Last BM:  7/7  Height:   Ht Readings from Last 1 Encounters:  03/12/19 5\' 6"  (1.676 m)    Weight:   Wt Readings from Last 1 Encounters:  03/12/19 82.3 kg    Ideal Body Weight:  59.1 kg  BMI:  Body mass index is 29.28 kg/m.  Estimated Nutritional Needs:   Kcal:  1676 kcal  Protein:  176-192 grams (2.3-2.5 grams/kg)  Fluid:  >/= 1.5 L/day     Jarome Matin, MS, RD, LDN, [facility] Inpatient Clinical Dietitian Pager # [phone] After hours/weekend pager # [phone]

## 2019-03-12 NOTE — Plan of Care (Signed)

## 2019-03-12 NOTE — Progress Notes (Signed)
Spoke with patient's daughter and provided full update/answered all questions.

## 2019-03-12 NOTE — Progress Notes (Addendum)
Pharmacy Antibiotic Note  Bristol Osentoski is a 68 y.o. female admitted on 02/02/2019 with sepsis.  Pharmacy has been consulted to start Vancomycin dosing. Currently on D#9 of IV Cefepime with worsening leukocytosis and fever spike overnight. Patient was started on CRRT on 7/10.   Plan: -Start Vancomycin 1750 mg IV load, then Vancomycin 1 gm IV Q 24 hours with CRRT  -Continue Cefepime 2 gm IV Q 12 hours \ -Monitor CBC, cultures and clinical progress -Vanc levels as needed   Height: 5\' 6"  (167.6 cm) Weight: 181 lb 7 oz (82.3 kg) IBW/kg (Calculated) : 59.3  Temp (24hrs), Avg:99 F (37.2 C), Min:95.5 F (35.3 C), Max:101.5 F (38.6 C)  Recent Labs  Lab 03/09/19 0518 03/10/19 0445 03/11/19 0500 03/11/19 1900 03/12/19 0530  WBC 43.4* 44.4* 39.0* 39.6* 46.9*  CREATININE 2.99* 3.07* 3.75* 4.14* 2.83*  2.75*    Estimated Creatinine Clearance: 21.2 mL/min (A) (by C-G formula based on SCr of 2.75 mg/dL (H)).    Allergies  Allergen Reactions  . Remdesivir Anaphylaxis    Antimicrobials this admission: 6/29 Azith >> 7/4 (QTc >500) 6/29 CXT >>7/4 7/4 Cefepime >>  7/4 Linezolid >> 7/5 7/12 Vanc>>    Microbiology results: 6/28 COVID + 6/28 BCx:  negF 7/4 MRSA PCR: neg 7/4 TA:  abundant GPC (normal flora) 7/11 BCx2>>  Thank you for allowing pharmacy to be a part of this patient's care.  Albertina Parr, PharmD., BCPS Clinical Pharmacist Clinical phone for 03/12/19 until 5pm: [phone]  Addendum: Pharmacy consulted to help with dosing IV iron along with ESA's to help treat anemia. Discussed case with nephrology regarding the patient's active infection. Nephrology is agreeable to hold off on start IV iron until infection starts to resolve.     Albertina Parr, PharmD., BCPS Clinical Pharmacist

## 2019-03-13 ENCOUNTER — Inpatient Hospital Stay (HOSPITAL_COMMUNITY): Payer: Medicare HMO

## 2019-03-13 DIAGNOSIS — L8990 Pressure ulcer of unspecified site, unspecified stage: Secondary | ICD-10-CM | POA: Insufficient documentation

## 2019-03-13 LAB — POCT I-STAT EG7
Bicarbonate: 27.4 mmol/L (ref 20.0–28.0)
Calcium, Ion: 1.22 mmol/L (ref 1.15–1.40)
HCT: 23 % — ABNORMAL LOW (ref 36.0–46.0)
Hemoglobin: 7.8 g/dL — ABNORMAL LOW (ref 12.0–15.0)
O2 Saturation: 67 %
Patient temperature: 37.1
Potassium: 5.2 mmol/L — ABNORMAL HIGH (ref 3.5–5.1)
Sodium: 133 mmol/L — ABNORMAL LOW (ref 135–145)
TCO2: 29 mmol/L (ref 22–32)
pCO2, Ven: 61.3 mmHg — ABNORMAL HIGH (ref 44.0–60.0)
pH, Ven: 7.259 (ref 7.250–7.430)
pO2, Ven: 41 mmHg (ref 32.0–45.0)

## 2019-03-13 LAB — RENAL FUNCTION PANEL
Albumin: 2 g/dL — ABNORMAL LOW (ref 3.5–5.0)
Albumin: 2.1 g/dL — ABNORMAL LOW (ref 3.5–5.0)
Anion gap: 9 (ref 5–15)
Anion gap: 14 (ref 5–15)
BUN: 53 mg/dL — ABNORMAL HIGH (ref 8–23)
BUN: 53 mg/dL — ABNORMAL HIGH (ref 8–23)
CO2: 26 mmol/L (ref 22–32)
CO2: 24 mmol/L (ref 22–32)
Calcium: 8.3 mg/dL — ABNORMAL LOW (ref 8.9–10.3)
Calcium: 8.6 mg/dL — ABNORMAL LOW (ref 8.9–10.3)
Chloride: 101 mmol/L (ref 98–111)
Chloride: 100 mmol/L (ref 98–111)
Creatinine, Ser: 1.62 mg/dL — ABNORMAL HIGH (ref 0.44–1.00)
Creatinine, Ser: 1.48 mg/dL — ABNORMAL HIGH (ref 0.44–1.00)
GFR calc Af Amer: 37 mL/min — ABNORMAL LOW (ref >60)
GFR calc Af Amer: 42 mL/min — ABNORMAL LOW (ref >60)
GFR calc non Af Amer: 32 mL/min — ABNORMAL LOW (ref >60)
GFR calc non Af Amer: 36 mL/min — ABNORMAL LOW (ref >60)
Glucose, Bld: 164 mg/dL — ABNORMAL HIGH (ref 70–99)
Glucose, Bld: 149 mg/dL — ABNORMAL HIGH (ref 70–99)
Phosphorus: 4.1 mg/dL (ref 2.5–4.6)
Phosphorus: 3.7 mg/dL (ref 2.5–4.6)
Potassium: 5.1 mmol/L (ref 3.5–5.1)
Potassium: 4.9 mmol/L (ref 3.5–5.1)
Sodium: 136 mmol/L (ref 135–145)
Sodium: 138 mmol/L (ref 135–145)

## 2019-03-13 LAB — CBC WITH DIFFERENTIAL/PLATELET
Abs Immature Granulocytes: 1.55 10*3/uL — ABNORMAL HIGH (ref 0.00–0.07)
Basophils Absolute: 0.1 10*3/uL (ref 0.0–0.1)
Basophils Relative: 0 %
Eosinophils Absolute: 0 10*3/uL (ref 0.0–0.5)
Eosinophils Relative: 0 %
HCT: 24.8 % — ABNORMAL LOW (ref 36.0–46.0)
Hemoglobin: 7.3 g/dL — ABNORMAL LOW (ref 12.0–15.0)
Immature Granulocytes: 4 %
Lymphocytes Relative: 3 %
Lymphs Abs: 1.4 10*3/uL (ref 0.7–4.0)
MCH: 27.9 pg (ref 26.0–34.0)
MCHC: 29.4 g/dL — ABNORMAL LOW (ref 30.0–36.0)
MCV: 94.7 fL (ref 80.0–100.0)
Monocytes Absolute: 3.7 10*3/uL — ABNORMAL HIGH (ref 0.1–1.0)
Monocytes Relative: 8 %
Neutro Abs: 37.2 10*3/uL — ABNORMAL HIGH (ref 1.7–7.7)
Neutrophils Relative %: 85 %
Platelets: 116 10*3/uL — ABNORMAL LOW (ref 150–400)
RBC: 2.62 MIL/uL — ABNORMAL LOW (ref 3.87–5.11)
RDW: 16.5 % — ABNORMAL HIGH (ref 11.5–15.5)
WBC: 43.9 10*3/uL — ABNORMAL HIGH (ref 4.0–10.5)
nRBC: 0.5 % — ABNORMAL HIGH (ref 0.0–0.2)

## 2019-03-13 LAB — POCT I-STAT 7, (LYTES, BLD GAS, ICA,H+H)
Acid-Base Excess: 3 mmol/L — ABNORMAL HIGH (ref 0.0–2.0)
Bicarbonate: 29.6 mmol/L — ABNORMAL HIGH (ref 20.0–28.0)
Calcium, Ion: 1.21 mmol/L (ref 1.15–1.40)
HCT: 26 % — ABNORMAL LOW (ref 36.0–46.0)
Hemoglobin: 8.8 g/dL — ABNORMAL LOW (ref 12.0–15.0)
O2 Saturation: 100 %
Patient temperature: 37.1
Potassium: 5.1 mmol/L (ref 3.5–5.1)
Sodium: 133 mmol/L — ABNORMAL LOW (ref 135–145)
TCO2: 31 mmol/L (ref 22–32)
pCO2 arterial: 59.5 mmHg — ABNORMAL HIGH (ref 32.0–48.0)
pH, Arterial: 7.306 — ABNORMAL LOW (ref 7.350–7.450)
pO2, Arterial: 458 mmHg — ABNORMAL HIGH (ref 83.0–108.0)

## 2019-03-13 LAB — APTT
aPTT: 82 seconds — ABNORMAL HIGH (ref 24–36)
aPTT: 70 seconds — ABNORMAL HIGH (ref 24–36)

## 2019-03-13 LAB — GLUCOSE, CAPILLARY
Glucose-Capillary: 151 mg/dL — ABNORMAL HIGH (ref 70–99)
Glucose-Capillary: 181 mg/dL — ABNORMAL HIGH (ref 70–99)
Glucose-Capillary: 214 mg/dL — ABNORMAL HIGH (ref 70–99)
Glucose-Capillary: 192 mg/dL — ABNORMAL HIGH (ref 70–99)
Glucose-Capillary: 195 mg/dL — ABNORMAL HIGH (ref 70–99)

## 2019-03-13 LAB — CBC
HCT: 24 % — ABNORMAL LOW (ref 36.0–46.0)
Hemoglobin: 7.1 g/dL — ABNORMAL LOW (ref 12.0–15.0)
MCH: 28 pg (ref 26.0–34.0)
MCHC: 29.6 g/dL — ABNORMAL LOW (ref 30.0–36.0)
MCV: 94.5 fL (ref 80.0–100.0)
Platelets: 104 10*3/uL — ABNORMAL LOW (ref 150–400)
RBC: 2.54 MIL/uL — ABNORMAL LOW (ref 3.87–5.11)
RDW: 16.5 % — ABNORMAL HIGH (ref 11.5–15.5)
WBC: 38.4 10*3/uL — ABNORMAL HIGH (ref 4.0–10.5)
nRBC: 0.8 % — ABNORMAL HIGH (ref 0.0–0.2)

## 2019-03-13 LAB — MAGNESIUM: Magnesium: 2.3 mg/dL (ref 1.7–2.4)

## 2019-03-13 LAB — PREPARE RBC (CROSSMATCH)

## 2019-03-13 LAB — PROCALCITONIN: Procalcitonin: 23.09 ng/mL

## 2019-03-13 LAB — LACTIC ACID, PLASMA: Lactic Acid, Venous: 1.8 mmol/L (ref 0.5–1.9)

## 2019-03-13 MED ORDER — SODIUM CHLORIDE 0.9 % IV SOLN: 125.0000 mg | Freq: Every day | INTRAVENOUS | Status: DC

## 2019-03-13 MED ORDER — DARBEPOETIN ALFA 150 MCG/0.3ML IJ SOSY: 150.0000 ug | PREFILLED_SYRINGE | INTRAMUSCULAR | Status: DC

## 2019-03-13 MED ORDER — ALBUTEROL SULFATE (2.5 MG/3ML) 0.083% IN NEBU
2.5000 mg | INHALATION_SOLUTION | RESPIRATORY_TRACT | Status: DC | PRN
  Administered 2019-03-13: 10:00:00 2.5 mg via RESPIRATORY_TRACT
  Filled 2019-03-13 (×3): qty 3

## 2019-03-13 MED ORDER — SODIUM CHLORIDE 0.9% IV SOLUTION: Freq: Once | INTRAVENOUS | Status: DC

## 2019-03-13 NOTE — Progress Notes (Signed)
[facility] Progress Note Patient Name: Nichole Franklin DOB: 1951/07/29 MRN: [id number]   Date of Service  03/13/2019  HPI/Events of Note  Notified that patient has been having abdominal breathing and decerebrate posturing most of the day but with new left gaze. H/H trending down with no obvious bleed. sPTT 82 this morning on heparin drip. Ongoing CRRT. Sedated on Fentanyl. Unable to get ABG. O2 sat on monitor 100%  eICU Interventions   Get VBG instead to assess if acidosis driving work of breathing  Repeat aPTT as with increased risk for bleed but neuro changes more likely metabolic in etiology and difficult logistics in terms of  bringing down for head CT  Patient is about to receive 1 unit PRBC     Intervention Category Major Interventions: Respiratory failure - evaluation and management  Judd Lien 03/13/2019, 9:33 PM

## 2019-03-13 NOTE — Progress Notes (Addendum)
EG7+ results (venous sample) (03/13/19 21:30)  pH           7.259 pCO2      61.3 pO2         41 BE           0 TCO2      29 bicarb      27.4 sO2%      67%  Na            133 K              5.2 iCa           1.22 Hct           23% Hgb          7.8  This is 45 minutes after increasing FiO2 from 30% to 100% on ventilator.  Below is arterial sample drawn at 21:46. ABG    Component Value Date/Time   PHART 7.306 (L) 03/13/2019 2146   PCO2ART 59.5 (H) 03/13/2019 2146   PO2ART 458.0 (H) 03/13/2019 2146   HCO3 29.6 (H) 03/13/2019 2146   TCO2 31 03/13/2019 2146   ACIDBASEDEF 5.0 (H) 03/11/2019 1539   O2SAT 100.0 03/13/2019 2146   EL[facility] notified of results. Awaiting further guidance.

## 2019-03-13 NOTE — Progress Notes (Signed)
PROGRESS NOTE  Nichole Franklin IWO:[id number] DOB: 17-Aug-1951 DOA: 02/16/2019  PCP: Elwyn Reach, MD  Brief History/Interval Summary: Patient is a 68 y.o. female with PMHx of DM-2, HTN, dyslipidemia, CKD stage IV who presented to the hospital initially for evaluation of weakness, she was found to have fever and acute kidney injury.  She was subsequently admitted to [facility] her renal function stabilized-she was transferred to [facility] course since admission has been complicated by encephalopathy, angioedema following initiation of Remdesivir-and respiratory failure requiring intubation on 7/4.  Reason for Visit: Acute respiratory disease due to COVID-19  Consultants: Pulmonology.  Nephrology  Procedures:  Intubation 7/4 Central line left IJ 7/4-->7/11 Hemodialysis catheter placement on 7/11 CRRT 7/11  Antibiotics: Anti-infectives (From admission, onward)   Start     Dose/Rate Route Frequency Ordered Stop   03/13/19 1400  vancomycin (VANCOCIN) IVPB 1000 mg/200 mL premix     1,000 mg 200 mL/hr over 60 Minutes Intravenous Every 24 hours 03/12/19 1151     03/12/19 1100  vancomycin (VANCOCIN) 1,750 mg in sodium chloride 0.9 % 500 mL IVPB     1,750 mg 250 mL/hr over 120 Minutes Intravenous  Once 03/12/19 1056 03/12/19 1500   03/11/19 2230  ceFEPIme (MAXIPIME) 2 g in sodium chloride 0.9 % 100 mL IVPB     2 g 200 mL/hr over 30 Minutes Intravenous Every 12 hours 03/11/19 2150     03/07/19 1000  ceFEPIme (MAXIPIME) 2 g in sodium chloride 0.9 % 100 mL IVPB  Status:  Discontinued     2 g 200 mL/hr over 30 Minutes Intravenous Every 24 hours 03/06/19 1434 03/11/19 2150   03/04/19 2200  ceFEPIme (MAXIPIME) 1 g in sodium chloride 0.9 % 100 mL IVPB  Status:  Discontinued     1 g 200 mL/hr over 30 Minutes Intravenous Every 12 hours 03/04/19 1256 03/06/19 1434   03/04/19 1400  remdesivir 100 mg in sodium chloride 0.9 % 250 mL IVPB  Status:  Discontinued       100 mg 500 mL/hr over 30 Minutes Intravenous Every 24 hours 03/03/19 1305 03/04/19 0936   03/04/19 1200  linezolid (ZYVOX) IVPB 600 mg  Status:  Discontinued     600 mg 300 mL/hr over 60 Minutes Intravenous Every 12 hours 03/04/19 1141 03/05/19 1100   03/04/19 0900  linezolid (ZYVOX) IVPB 600 mg  Status:  Discontinued     600 mg 300 mL/hr over 60 Minutes Intravenous Every 12 hours 03/04/19 0759 03/04/19 1141   03/04/19 0800  ceFEPIme (MAXIPIME) 2 g in sodium chloride 0.9 % 100 mL IVPB  Status:  Discontinued     2 g 200 mL/hr over 30 Minutes Intravenous Every 12 hours 03/04/19 0706 03/04/19 1256   03/03/19 1400  remdesivir 200 mg in sodium chloride 0.9 % 250 mL IVPB     200 mg 500 mL/hr over 30 Minutes Intravenous Once 03/03/19 1305 03/03/19 1500   02/27/19 2000  azithromycin (ZITHROMAX) 500 mg in sodium chloride 0.9 % 250 mL IVPB  Status:  Discontinued     500 mg 250 mL/hr over 60 Minutes Intravenous Every 24 hours 02/27/19 0428 03/04/19 1211   02/27/19 0430  cefTRIAXone (ROCEPHIN) 1 g in sodium chloride 0.9 % 100 mL IVPB  Status:  Discontinued     1 g 200 mL/hr over 30 Minutes Intravenous Every 24 hours 02/27/19 0428 03/04/19 0706       Subjective/Interval History: Patient remains intubated and  sedated.     Assessment/Plan:  Acute Hypoxic Resp. Failure due to Acute Covid 19 Viral Illness  Vent Mode: PRVC FiO2 (%):  [30 %] 30 % Set Rate:  [14 bmp] 14 bmp Vt Set:  [470 mL] 470 mL PEEP:  [5 cmH20] 5 cmH20 Plateau Pressure:  [24 cmH20-25 cmH20] 25 cmH20     Component Value Date/Time   PHART 7.325 (L) 03/11/2019 1539   PCO2ART 39.1 03/11/2019 1539   PO2ART 73.0 (L) 03/11/2019 1539   HCO3 20.1 03/11/2019 1539   TCO2 21 (L) 03/11/2019 1539   ACIDBASEDEF 5.0 (H) 03/11/2019 1539   O2SAT 92.0 03/11/2019 1539    COVID-19 Labs  Recent Labs    03/11/19 0500 03/12/19 0530  DDIMER 6.95* 5.98*    Lab Results  Component Value Date   SARSCOV2NAA POSITIVE (A) 02/25/2019      Fever: Hypothermia appears to have improved.  T-max of 101.5 F on 7/11 Oxygen requirements: Mechanical ventilation.  30% FiO2.  Saturating in the 90s.    Antibiotics: Patient has been on cefepime, day 10.  Vancomycin was added 7/12.   Remdesivir: Patient developed angioedema to Remdesivir Steroids: She has been taken off of steroids due to encephalopathy Diuretics: She has received furosemide during this hospitalization. Last dose was on 7/9 Actemra: Did not receive Convalescent Plasma: Did not receive Vitamin C and Zinc: Continue DVT Prophylaxis: On heparin 7500 units subcutaneously every 8 hours  Patient was initially intubated due to angioedema thought to be secondary to Remdesivir.  Angioedema resolved.  Patient was extubated on 7/8.  She did have some stridor at the same evening which was treated with nebulizer treatments and steroids.  This improved.  However her mentation continued to get worse.  She was unable to protect her airway.  She was reintubated on 7/10.   She remains on mechanical ventilation.  Pulmonology is following and managing vent.  Patient was noted to have fever and then subsequently hypothermia.  Concern was for sepsis.  Procalcitonin noted to be significantly elevated at 23.09.  Lactic acid level normal.  Unclear significance in view of renal failure and on CRRT.  WBC 43.9 today.  This is slightly better compared to yesterday.  Patient remains on vancomycin and cefepime.  Cultures are pending.  Chest x-ray does show worsening opacities.  Acute kidney injury on chronic kidney disease stage IV Baseline creatinine around 1.6-2.  Presented with a BUN of 81 and a creatinine of 7.09.  Renal function had improved.  Renal function again started getting worse.  Her urine output dropped.  Nephrology was consulted.  Dialysis catheter was placed.  Patient was started on CRRT.  Nephrology continues to follow.  No urine output noted.  Bladder scan did not show any urine in the  urinary bladder.  Okay to take out Foley catheter.  Acute metabolic encephalopathy She had encephalopathy earlier during the course of the hospitalization thought to be due to hypoxemia and other acute metabolic derangement.  CT scan done on 7/4 did not show any acute findings.  After extubation her mentation has been progressively getting worse.  She had a CT scan repeated on 7/10 which did not show any acute findings.  She had to be reintubated as she was not able to protect her airway.  Mentation could also be altered due to uremia.  Steroids were discontinued.  Now she is on mechanical ventilation.  Stable for the most part.  BUN is improved with CRRT.  Hopefully this will result  in improvement in mentation.  Septic shock Continues to require Levophed.  Procalcitonin noted to be elevated.  Normal lactic acid level noted.  Continue vancomycin and cefepime.    Elevated d-dimer Patient was empirically started on IV heparin on 7/4 due to concern for VTE.  Due to drop in hemoglobin this was discontinued.  Lower extremity Doppler study was negative for DVT.  Clinically there was low suspicion for VTE.  D-dimer continues to trend down. Continue high-dose subcutaneous heparin for now.    Angioedema Most likely secondary to Remdesivir.  Tongue swelling has improved.  Hypernatremia Resolved with free water.  Monitor electrolytes closely.  Diabetes mellitus type 2 with uncontrolled hyperglycemia HbA1c 9.3.  Patient is on Lantus insulin and SSI.  CBGs reasonably well controlled.  Initially was on IV insulin.    Normocytic anemia  Patient has required transfusion of 2 PRBCs.  First on 7/6 and then another 1 on 7/11.  Hemoglobin did respond well to blood transfusion.  Noted to be low again today.  No evidence for overt bleeding.  Continue to monitor.  May need transfusion.  Recheck CBC later today.  Nutrition Being fed through a feeding tube.    DVT Prophylaxis: Subcutaneous heparin PUD  Prophylaxis: Protonix Code Status: Full code Family Communication: Pulmonology has been discussing with patient's family. Disposition Plan: Remain in ICU for now   Medications:  Scheduled:  chlorhexidine gluconate (MEDLINE KIT)  15 mL Mouth Rinse BID   Chlorhexidine Gluconate Cloth  6 each Topical Q0600   cholecalciferol  1,000 Units Per Tube Daily   darbepoetin (ARANESP) injection - NON-DIALYSIS  60 mcg Subcutaneous Q Sun-1800   feeding supplement (PRO-STAT SUGAR FREE 64)  60 mL Per Tube TID   ferrous sulfate  300 mg Per Tube BID WC   free water  300 mL Per Tube Q4H   heparin injection (subcutaneous)  7,500 Units Subcutaneous Q8H   insulin aspart  0-20 Units Subcutaneous Q4H   insulin glargine  40 Units Subcutaneous BID   ipratropium-albuterol  3 mL Nebulization Q6H   mouth rinse  15 mL Mouth Rinse 10 times per day   pantoprazole sodium  40 mg Per Tube Daily   polyethylene glycol  17 g Per Tube BID   senna  2 tablet Per Tube QHS   sodium chloride flush  10-40 mL Intracatheter Q12H   Continuous:   prismasol BGK 4/2.5 400 mL/hr at 03/13/19 1215    prismasol BGK 4/2.5 200 mL/hr at 03/12/19 2316   sodium chloride 5 mL/hr at 03/13/19 1200   ceFEPime (MAXIPIME) IV Stopped (03/13/19 1128)   feeding supplement (VITAL HIGH PROTEIN) 1,000 mL (03/12/19 1313)   fentaNYL infusion INTRAVENOUS 50 mcg/hr (03/13/19 1200)   heparin 10,000 units/ 20 mL infusion syringe 500 Units/hr (03/13/19 1100)   norepinephrine (LEVOPHED) Adult infusion Stopped (03/13/19 0733)   prismasol BGK 4/2.5 1,800 mL/hr at 03/13/19 1056   vancomycin     UEK:CMKLKJ chloride, acetaminophen **OR** acetaminophen, albuterol, alteplase, bisacodyl, dextrose, EPINEPHrine, fentaNYL (SUBLIMAZE) injection, fentaNYL (SUBLIMAZE) injection, guaiFENesin-dextromethorphan, heparin, heparin, hydrALAZINE, lip balm, midazolam, ondansetron **OR** ondansetron (ZOFRAN) IV, sodium chloride, sodium chloride flush,  sodium phosphate   Objective:  Vital Signs  Vitals:   03/13/19 1115 03/13/19 1130 03/13/19 1145 03/13/19 1200  BP: (!) 100/48 (!) 99/54 (!) 123/43 (!) 102/44  Pulse: 86 85 85 88  Resp: (!) 29 (!) 33 (!) 30 (!) 28  Temp:      TempSrc:      SpO2: 97%  97% 97% 96%  Weight:      Height:        Intake/Output Summary (Last 24 hours) at 03/13/2019 1218 Last data filed at 03/13/2019 1211 Gross per 24 hour  Intake 4415.52 ml  Output 5426 ml  Net -1010.48 ml   Filed Weights   03/11/19 0500 03/12/19 0500 03/13/19 0500  Weight: 82.4 kg 82.3 kg 80.4 kg    General appearance: Intubated and sedated Resp: Coarse breath sounds bilaterally.  Few rhonchi.  Few crackles at the bases.  No wheezing.   Cardio: S1-S2 is normal regular.  No S3-S4.  No rubs murmurs or bruit GI: Abdomen is soft.  Nontender nondistended.  Bowel sounds are present normal.  No masses organomegaly Extremities: No edema.   Neurologic: She is sedated.    Lab Results:  Data Reviewed: I have personally reviewed following labs and imaging studies  CBC: Recent Labs  Lab 03/10/19 0445  03/11/19 0500 03/11/19 1539 03/11/19 1900 03/12/19 0530 03/13/19 0420  WBC 44.4*  --  39.0*  --  39.6* 46.9* 43.9*  NEUTROABS 38.4*  --  32.9*  --   --  39.9* 37.2*  HGB 7.7*   < > 6.8* 6.1* 8.1* 8.3* 7.3*  HCT 24.6*   < > 22.2* 18.0* 25.4* 26.8* 24.8*  MCV 90.4  --  92.5  --  91.4 93.7 94.7  PLT 181  --  156  --  139* 136* 116*   < > = values in this interval not displayed.    Basic Metabolic Panel: Recent Labs  Lab 03/11/19 0500 03/11/19 1539 03/11/19 1900 03/12/19 0530 03/12/19 1545 03/13/19 0420  NA 143 139 140 138   137 139 136  K 3.8 4.5 4.7 4.9   4.9 4.7 5.1  CL 108  --  106 102   101 102 101  CO2 22  --  _0 GLUCOSE 153*  --  155* 221*   222* 139* 164*  BUN 141*  --  110* 94*   94* 64* 53*  CREATININE 3.75*  --  4.14* 2.83*   2.75* 1.98* 1.62*  CALCIUM 8.2*  --  8.0* 8.2*   8.1* 8.2* 8.3*  MG   --   --   --  2.3  --  2.3  PHOS  --   --  6.4* 5.1* 3.7 4.1    GFR: Estimated Creatinine Clearance: 35.5 mL/min (A) (by C-G formula based on SCr of 1.62 mg/dL (H)).  Liver Function Tests: Recent Labs  Lab 03/07/19 0425 03/08/19 0400 03/11/19 0500 03/11/19 1900 03/12/19 0530 03/12/19 1545 03/13/19 0420  AST 118* 35 32  --   --   --   --   ALT 80* 65* 48*  --   --   --   --   ALKPHOS 226* 192* 146*  --   --   --   --   BILITOT 0.4 0.6 0.3  --   --   --   --   PROT 6.8 6.2* 6.0*  --   --   --   --   ALBUMIN 2.8* 2.6* 2.1* 2.0* 2.2* 2.0* 2.0*     CBG: Recent Labs  Lab 03/12/19 1957 03/12/19 2329 03/13/19 0328 03/13/19 0745 03/13/19 1200  GLUCAP 145* 132* 151* 181* 214*      Recent Results (from the past 240 hour(s))  Culture, respiratory (non-expectorated)     Status: None   Collection Time: 03/04/19  11:41 AM   Specimen: Tracheal Aspirate; Respiratory  Result Value Ref Range Status   Specimen Description   Final    TRACHEAL ASPIRATE Performed at [facility] [address], [place], [place] [address]    Special Requests   Final    NONE Performed at [facility], [place] [address] [place], [place] [address]    Gram Stain   Final    RARE WBC PRESENT, PREDOMINANTLY PMN MODERATE SQUAMOUS EPITHELIAL CELLS PRESENT ABUNDANT GRAM POSITIVE COCCI    Culture   Final    MODERATE Consistent with normal respiratory flora. Performed at [facility], [place] [address], [place], [place] [address]    Report Status 03/07/2019 FINAL  Final  MRSA PCR Screening     Status: None   Collection Time: 03/04/19  7:08 PM   Specimen: Nasal Mucosa; Nasopharyngeal  Result Value Ref Range Status   MRSA by PCR NEGATIVE NEGATIVE Final    Comment:        The GeneXpert MRSA Assay (FDA approved for NASAL specimens only), is one component of a comprehensive MRSA colonization surveillance program. It is not intended to diagnose MRSA infection  nor to guide or monitor treatment for MRSA infections. Performed at [facility], [place] [address] [place], [place] [address]   Culture, blood (routine x 2)     Status: None (Preliminary result)   Collection Time: 03/11/19 10:18 PM   Specimen: BLOOD  Result Value Ref Range Status   Specimen Description BLOOD RIGHT ANTECUBITAL  Final   Special Requests   Final    BOTTLES DRAWN AEROBIC AND ANAEROBIC Blood Culture adequate volume Performed at [facility], [place] [address] [place], [place] [address]    Culture PENDING  Incomplete   Report Status PENDING  Incomplete  Culture, blood (routine x 2)     Status: None (Preliminary result)   Collection Time: 03/11/19 10:23 PM   Specimen: BLOOD  Result Value Ref Range Status   Specimen Description BLOOD RIGHT ANTECUBITAL  Final   Special Requests   Final    BOTTLES DRAWN AEROBIC AND ANAEROBIC Blood Culture adequate volume Performed at [facility], [place] [address], [place], [place] [address]    Culture PENDING  Incomplete   Report Status PENDING  Incomplete      Radiology Studies: US Renal  Result Date: 03/11/2019 CLINICAL DATA:  Acute renal failure EXAM: RENAL / URINARY TRACT ULTRASOUND COMPLETE COMPARISON:  None. FINDINGS: Right Kidney: Renal measurements: 10.2 x 5.1 x 5.2 cm = volume: 141.4 mL. Increased cortical echogenicity Left Kidney: Renal measurements: 8.8 x 5.3 x 5.6 cm = volume: 136.1 mL. Increased cortical echogenicity. Limited evaluation. Bladder: Not visualized on today's study. IMPRESSION: 1. The bladder was not visualized on today's study. 2. Evaluation of the left kidney is limited. Within this limitation, the kidneys demonstrate increased cortical echogenicity suggesting medical renal disease. No hydronephrosis noted. Electronically Signed   By: Dorise Bullion III M.D   On: 03/11/2019 16:36   Dg Chest Port 1 View  Result Date: 03/13/2019 CLINICAL DATA:  Pneumonia due to COVID-19 virus. EXAM:  PORTABLE CHEST 1 VIEW COMPARISON:  Radiograph of March 11, 2019. FINDINGS: The heart size and mediastinal contours are within normal limits. Endotracheal and feeding tubes are unchanged in position. Right internal jugular catheter is unchanged in position. Left-sided internal jugular catheter has been removed. Interval placement of right-sided PICC line with distal tip in expected position of  cavoatrial junction. Increased bilateral lung opacities are noted consistent with worsening multifocal pneumonia. The visualized skeletal structures are unremarkable. IMPRESSION: Interval placement of right sided PICC line. Otherwise stable support apparatus. Worsening bilateral lung opacities are noted most consistent with worsening multifocal pneumonia. Electronically Signed   By: Marijo Conception M.D.   On: 03/13/2019 08:10   Dg Chest Port 1 View  Result Date: 03/11/2019 CLINICAL DATA:  Central line placement EXAM: PORTABLE CHEST 1 VIEW COMPARISON:  March 10, 2019 FINDINGS: A new right central line terminates in the central SVC. No pneumothorax. The left central line is stable probably terminating in the left brachiocephalic vein. The ETT is in good position. The NG tube terminates below today's film. Cardiomediastinal silhouette is stable. Hazy opacities in the lungs, left greater than right, are stable. IMPRESSION: 1. Support apparatus as above. New right central line in good position. No pneumothorax. 2. Patchy bilateral opacities, left greater than right, worrisome for an infectious process. Electronically Signed   By: Dorise Bullion III M.D   On: 03/11/2019 17:11       LOS: 15 days   [facility] Pager on [email or web address]  03/13/2019, 12:18 PM

## 2019-03-13 NOTE — Progress Notes (Signed)
Spoke with daughter, [name] and answered all questions/provided full update. Assisted with Facetime video visit. Patient's daughter tearful over the phone but very grateful for care her mother is receiving.

## 2019-03-13 NOTE — Progress Notes (Signed)
[facility] Progress Note  Subjective: on vent, 1100 negative with CRRT yesterday.  It appears that pressors have been weaned off - no mechanical issues with machine.  Just now had episode of hypotension - back on pressors   Vitals:   03/13/19 1345 03/13/19 1400 03/13/19 1415 03/13/19 1430  BP: (!) 106/44 (!) 126/107 (!) 83/37 (!) 114/46  Pulse: 92 85 (!) 58 91  Resp: (!) 28 (!) _0 Temp:      TempSrc:      SpO2: 97% 97% (!) 84% 100%  Weight:      Height:        Inpatient medications: . chlorhexidine gluconate (MEDLINE KIT)  15 mL Mouth Rinse BID  . Chlorhexidine Gluconate Cloth  6 each Topical Q0600  . cholecalciferol  1,000 Units Per Tube Daily  . darbepoetin (ARANESP) injection - NON-DIALYSIS  60 mcg Subcutaneous Q Sun-1800  . feeding supplement (PRO-STAT SUGAR FREE 64)  60 mL Per Tube TID  . ferrous sulfate  300 mg Per Tube BID WC  . free water  300 mL Per Tube Q4H  . heparin injection (subcutaneous)  7,500 Units Subcutaneous Q8H  . insulin aspart  0-20 Units Subcutaneous Q4H  . insulin glargine  40 Units Subcutaneous BID  . ipratropium-albuterol  3 mL Nebulization Q6H  . mouth rinse  15 mL Mouth Rinse 10 times per day  . pantoprazole sodium  40 mg Per Tube Daily  . polyethylene glycol  17 g Per Tube BID  . senna  2 tablet Per Tube QHS  . sodium chloride flush  10-40 mL Intracatheter Q12H   .  prismasol BGK 4/2.5 400 mL/hr at 03/13/19 1215  .  prismasol BGK 4/2.5 200 mL/hr at 03/12/19 2316  . sodium chloride 5 mL/hr at 03/13/19 1300  . ceFEPime (MAXIPIME) IV Stopped (03/13/19 1128)  . feeding supplement (VITAL HIGH PROTEIN) 1,000 mL (03/13/19 1230)  . fentaNYL infusion INTRAVENOUS 75 mcg/hr (03/13/19 1300)  . heparin 10,000 units/ 20 mL infusion syringe 500 Units/hr (03/13/19 1300)  . norepinephrine (LEVOPHED) Adult infusion Stopped (03/13/19 0733)  . prismasol BGK 4/2.5 1,800 mL/hr at 03/13/19 1356  . vancomycin     sodium chloride,  acetaminophen **OR** acetaminophen, albuterol, alteplase, bisacodyl, dextrose, EPINEPHrine, fentaNYL (SUBLIMAZE) injection, fentaNYL (SUBLIMAZE) injection, guaiFENesin-dextromethorphan, heparin, heparin, hydrALAZINE, lip balm, midazolam, ondansetron **OR** ondansetron (ZOFRAN) IV, sodium chloride, sodium chloride flush, sodium phosphate    Exam: Due to COVID restrictions and isolation- pt was not seen by in an effort to preserve PPE and to minimize exposure to providers and other patients.  Temp HD cath- right IJ placed 7/11   Home meds of note:  - lisinopril 20 qd/ hydrochlorothiazide 25 qd   - duloxetine 30 qd/ gabapentin 300 tid/ vicodin qid prn  - naproxen 440 bid prn/    UNa 76, UCr 58  (02/27/19)   Date                            Creat               eGFR  2009- 11/2016              1.3- 1.8            32- 50  CKD III  05/2017                        2.61  21  CKD IV  02/05/2019- 03/11/19         7.09 >> 4.14    Renal US >  8.8/ 10 cm kidneys, ^echogenicity c/w CKD, no hydro  CXR 7/11 > Patchy bilateral opacities, left greater than right, worrisome for an infectious process  total I/O 6/29 - 7/10 = 28L in and 27.8 L out, +365  admit wt = 77kg, peak 82kg   FeSat 4% on 6/29, ferr 244   Assessment/ Plan: 1. AKI on CKDIV - baseline creat around 2.5 from 2018 which was CKD IV eGFR 78m/min.  Echogenic kidneys on [place] go along w/ advanced renal failure chronically.  Creat 7 on admission due to dehydration, improved to 2.9 then rose to 4.0 and started CRRT on 7/11.  Plan cont CRRT for now, heparin 500u/hr flat rate. Numbers have improved on CRRT  2. Volume -  CVP -11 CXR's not helpful w/ bilat infiltrates. Up 6kg by wt's from admission, no sig edema on exam. On low dose pressors- now off , keeping even to slightly negative.  3. DM2 on insulin 4. COVID-19 PNA / vent dep resp failure: per primary- WBC 43 K   5. Anemia ckd + blood draws+ other: possibly CKD related in part, Hb 9  on admit , now 7's.  Low Fe sat 6/29. Will start esa - inc dose and IV Fe - dont see that it has been given- will order (pharmacy told me we cannot give ) . Transfuse prn.    [facility] 03/13/2019, 2:34 PM  Iron/TIBC/Ferritin/ %Sat    Component Value Date/Time   IRON 14 (L) 02/27/2019 0215   TIBC 350 02/27/2019 0215   FERRITIN 154 03/08/2019 0400   IRONPCTSAT 4 (L) 02/27/2019 0215   Recent Labs  Lab 03/13/19 0420  NA 136  K 5.1  CL 101  CO2 26  GLUCOSE 164*  BUN 53*  CREATININE 1.62*  CALCIUM 8.3*  PHOS 4.1  ALBUMIN 2.0*   Recent Labs  Lab 03/11/19 0500  AST 32  ALT 48*  ALKPHOS 146*  BILITOT 0.3  PROT 6.0*   Recent Labs  Lab 03/13/19 0420  WBC 43.9*  HGB 7.3*  HCT 24.8*  PLT 116*

## 2019-03-13 NOTE — Progress Notes (Signed)
PT Cancellation Note  Patient Details Name: Nichole Franklin MRN: [id number] DOB: Feb 25, 1951   Cancelled Treatment:    Reason Eval/Treat Not Completed: Medical issues which prohibited therapy  Patient now intubated and sedated with RASS -3. Will continue to follow if appropriate to participate with PT.   Jeanie Cooks [name], PT 03/13/2019, 1:35 PM

## 2019-03-13 NOTE — Progress Notes (Signed)
[facility] notified of worsening pt condition w/multiple concerns. Pt belly breathing, new leftward gaze preference, continued decerebrate posturing, and c/f bleeding given trends from H&H. O2sat 100% but RT unable to obtain ABG from the one arm we can stick (d/t PICC in other arm), so cannot confirm accuracy of sat. Very diminished breath sounds bilaterally. No subQ air noted, no tracheal deviation noted. BP stable MAP 65-70.  Pt has heparin running via CRRT syringe as well as subcutaneously. Awaiting a unit of pRBC to arrive from blood bank.   Mark to discuss with [facility] MD. Awaiting further orders.

## 2019-03-13 NOTE — Plan of Care (Signed)

## 2019-03-13 NOTE — Progress Notes (Signed)
NAME:  Nichole Franklin, MRN:  [id number], DOB:  16-Dec-1950, LOS: 49 ADMISSION DATE:  02/01/2019, CONSULTATION DATE:  7/4 REFERRING MD:  Aileen Fass, CHIEF COMPLAINT:  Found confused, tongue swelling   Brief History   68 y/o female with extensive PMH admitted June 28 in the setting of a new COVID 19 diagnosis.  Admitted to [facility] due to renal failure which improved with IV fluids.  Transferred to [facility]  On hospital day 6 she developed worsening confusion and tongue swelling felt to be a reaction to remdesivir; then was transferred to the ICU emergently for intubation.   Past Medical History  Hypertension Hyperlipidemia Diabetes mellitus Gastroesophageal reflux disease History of cervical cancer Anxiety  Significant Hospital Events   June 28 admission [facility] from [facility] for AKI, poor po intake June 30 transfer to [id number], confusion July 2: worsening hypoxemia July 3 remdesivir, worsening confusion July 4 tongue swelling, hypertension, severe encephalopathy, transfer to ICU, intubation, new worsening fever - Admitted to the ICU for worsening confusion, required emergent intubation Tongue swelling noted July 5 > July 7 tongue swelling removed, awake, not extubated July 8 extubated, some stridor July 9 mild increased work of breathing, steroids added, did not sleep the night before, trazodone added, mild delirium July 10 worsening delirium, Intubated for inability to protect airway  July 11 worsening renal function, started on CVVHD July 12 worsening hypotension overnight fever  Consults:  Nephrology, signed off then reconsulted on 7/11 PCCM  Procedures:  July 4 left IJ CVL July 4 endotracheal tube July 8 Cor-Trak gastric tube July 11 right internal jugular hemodialysis catheter  Significant Diagnostic Tests:  June 28 CT scan abdomen pelvis for nephrolithiasis: No explanation of renal failure, no urinary calculus, extensive multifocal groundglass opacity in  lungs, sclerotic inferior endplate deformities of T11 and T12 July 4 CT Head > pending  Micro Data:  7/4 resp culture > OPF July 11 blood cultures > NGTD  Antimicrobials:  6/28 ceftriaxone/azithro > July 3 7/3 cefepime >  7/4 Linezolid > 7/5   Interim history/subjective:   Remains on CVVHD Hypotension this morning Some ventilator dyssynchrony today Wheezing   Objective   Blood pressure (!) 123/94, pulse 85, temperature 97.8 F (36.6 C), temperature source Oral, resp. rate (!) 26, height 5\' 6"  (1.676 m), weight 80.4 kg, SpO2 100 %. CVP:  [5 mmHg-7 mmHg] 5 mmHg  Vent Mode: PRVC FiO2 (%):  [30 %] 30 % Set Rate:  [14 bmp] 14 bmp Vt Set:  [470 mL] 470 mL PEEP:  [5 cmH20] 5 cmH20 Plateau Pressure:  [21 cmH20-25 cmH20] 25 cmH20   Intake/Output Summary (Last 24 hours) at 03/13/2019 0804 Last data filed at 03/13/2019 0800 Gross per 24 hour  Intake 4207.82 ml  Output 5601 ml  Net -1393.18 ml   Filed Weights   03/11/19 0500 03/12/19 0500 03/13/19 0500  Weight: 82.4 kg 82.3 kg 80.4 kg    Examination:  General:  In bed on vent HENT: NCAT ETT in place PULM: CTA B, vent supported breathing CV: RRR, no mgr GI: BS+, soft, nontender MSK: normal bulk and tone Neuro: sedated on vent   7/11 CXR images personally reviewed> likely interstitial edema bilaterally, ETT in place   Resolved Hospital Problem list   Angioedema due to remdesivir, requirement for intubation> resolved Mild stridor 7/9> resolved  Assessment & Plan:  Acute encephalopathy> persistent Insomnia/ICU delirium Neuromuscular weakness Uremia Minimize sedation Frequent orientation Passive ROM exercises in bed Continue CVVHD, re-assess  COVID  19 pneumonia> improved, CXR normal, oxygenation OK Worsening acute on chronic renal failure Continue CVVHD  Acute respiratory failure with hypoxemia due to inability to protect airway: Continue full mechanical ventilatory support via ventilator stabilizing protocol  Vent dyssynchrony on 7/13 > change to pressure support, wheezing Full mechanical ventilatory support Monitor SaO2, titrate PEEP and FiO2 per standard protocol Minimize sedation Continue scheduled duoneb Add prn albuterol  Fever 7/4> due to COVID Elevated WBC Hypotension?  Sepsis PICC Monitor blood culture Continue cefepime/vanc, consider stopping/narrowing on 7/14  Anemia of CKD: no evidence of bleeding Monitor for bleeding Transfuse PRBC for Hgb < 7 gm/dL    Best practice:  Diet: continue tube feeding Pain/Anxiety/Delirium protocol (if indicated): RASS 0 to -1 fentanyl infusion, hold early AM on 7/13 VAP protocol (if indicated): yes DVT prophylaxis: sub q heparin GI prophylaxis: famotidine Glucose control: SSI Mobility: bed rest Code Status: full Family Communication: I updated her daughter [name] by phone on 7/13 Disposition: remain in ICU  Labs   CBC: Recent Labs  Lab 03/10/19 0445  03/11/19 0500 03/11/19 1539 03/11/19 1900 03/12/19 0530 03/13/19 0420  WBC 44.4*  --  39.0*  --  39.6* 46.9* 43.9*  NEUTROABS 38.4*  --  32.9*  --   --  39.9* 37.2*  HGB 7.7*   < > 6.8* 6.1* 8.1* 8.3* 7.3*  HCT 24.6*   < > 22.2* 18.0* 25.4* 26.8* 24.8*  MCV 90.4  --  92.5  --  91.4 93.7 94.7  PLT 181  --  156  --  139* 136* 116*   < > = values in this interval not displayed.    Basic Metabolic Panel: Recent Labs  Lab 03/11/19 0500 03/11/19 1539 03/11/19 1900 03/12/19 0530 03/12/19 1545 03/13/19 0420  NA 143 139 140 138  137 139 136  K 3.8 4.5 4.7 4.9  4.9 4.7 5.1  CL 108  --  106 102  101 102 101  CO2 22  --  22 25  23 25 26   GLUCOSE 153*  --  155* 221*  222* 139* 164*  BUN 141*  --  110* 94*  94* 64* 53*  CREATININE 3.75*  --  4.14* 2.83*  2.75* 1.98* 1.62*  CALCIUM 8.2*  --  8.0* 8.2*  8.1* 8.2* 8.3*  MG  --   --   --  2.3  --  2.3  PHOS  --   --  6.4* 5.1* 3.7 4.1   GFR: Estimated Creatinine Clearance: 35.5 mL/min (A) (by C-G formula based on SCr of  1.62 mg/dL (H)). Recent Labs  Lab 03/10/19 0445 03/11/19 0500 03/11/19 1900 03/12/19 0530 03/13/19 0420  PROCALCITON 2.45 2.65  --   --  23.09  WBC 44.4* 39.0* 39.6* 46.9* 43.9*  LATICACIDVEN  --   --   --   --  1.8    Liver Function Tests: Recent Labs  Lab 03/07/19 0425 03/08/19 0400 03/11/19 0500 03/11/19 1900 03/12/19 0530 03/12/19 1545 03/13/19 0420  AST 118* 35 32  --   --   --   --   ALT 80* 65* 48*  --   --   --   --   ALKPHOS 226* 192* 146*  --   --   --   --   BILITOT 0.4 0.6 0.3  --   --   --   --   PROT 6.8 6.2* 6.0*  --   --   --   --   ALBUMIN  2.8* 2.6* 2.1* 2.0* 2.2* 2.0* 2.0*   No results for input(s): LIPASE, AMYLASE in the last 168 hours. Recent Labs  Lab 03/11/19 0500  AMMONIA 45*    ABG    Component Value Date/Time   PHART 7.325 (L) 03/11/2019 1539   PCO2ART 39.1 03/11/2019 1539   PO2ART 73.0 (L) 03/11/2019 1539   HCO3 20.1 03/11/2019 1539   TCO2 21 (L) 03/11/2019 1539   ACIDBASEDEF 5.0 (H) 03/11/2019 1539   O2SAT 92.0 03/11/2019 1539     Coagulation Profile: No results for input(s): INR, PROTIME in the last 168 hours.  Cardiac Enzymes: No results for input(s): CKTOTAL, CKMB, CKMBINDEX, TROPONINI in the last 168 hours.  HbA1C: Hgb A1c MFr Bld  Date/Time Value Ref Range Status  02/27/2019 02:15 AM 9.3 (H) 4.8 - 5.6 % Final    Comment:    (NOTE) Pre diabetes:          5.7%-6.4% Diabetes:              >6.4% Glycemic control for   <7.0% adults with diabetes   08/05/2010 05:10 AM (H) <5.7 % Final   8.0 (NOTE)                                                                       According to the ADA Clinical Practice Recommendations for 2011, when HbA1c is used as a screening test:   >=6.5%   Diagnostic of Diabetes Mellitus           (if abnormal result  is confirmed)  5.7-6.4%   Increased risk of developing Diabetes Mellitus  References:Diagnosis and Classification of Diabetes Mellitus,Diabetes POEU,2353,61(WERXV 1):S62-S69 and  Standards of Medical Care in         Diabetes - 2011,Diabetes Care,2011,34  (Suppl 1):S11-S61.    CBG: Recent Labs  Lab 03/12/19 1526 03/12/19 1957 03/12/19 2329 03/13/19 0328 03/13/19 0745  GLUCAP 155* 145* 132* 151* 181*       Critical care time: 35 minutes    Roselie Awkward, MD [place] PCCM Pager: [phone] Cell: [phone] If no response, call [phone]

## 2019-03-13 NOTE — Progress Notes (Signed)
   03/13/19 1415  Vitals  BP (!) 83/37  MAP (mmHg) (!) 51  Pulse Rate (!) 58  ECG Heart Rate (!) 58  Resp 18  Oxygen Therapy  SpO2 (!) 84 %  MEWS Score  MEWS RR 0  MEWS Pulse 0  MEWS Systolic 1  MEWS LOC 2  MEWS Temp 0  MEWS Score 3  MEWS Score Color Yellow   Patient turned to check for BM. On turning, patient had a large black BM. After patient cleaned, turned, and placed HOB up to 30 degrees, patient became apneic, O2 sats decreased, and patient became hypotensive/bradycardic. Called RT to assess ventilator/airway status. RT assisted and bagged patient with no complications and visible chest rise. Patient then placed back on ventilator.Stopped pulling all fluid with CRRT and restarted patient on levophed.  VS improved. Will attempt to wean Levophed off as tolerated.

## 2019-03-13 NOTE — Progress Notes (Signed)
SLP Cancellation Note  Patient Details Name: Nichole Franklin MRN: [id number] DOB: May 20, 1951   Cancelled evaluation:    Pt remains intubated; SLP to sign off.       Juan Quam Laurice 03/13/2019, 10:11 AM

## 2019-03-14 ENCOUNTER — Inpatient Hospital Stay (HOSPITAL_COMMUNITY): Payer: Self-pay

## 2019-03-14 ENCOUNTER — Inpatient Hospital Stay (HOSPITAL_COMMUNITY)
Admission: AD | Admit: 2019-03-14 | Discharge: 2019-03-14 | Disposition: A | Discharge status: Home / Self Care | Payer: Medicare HMO | Source: Other Acute Inpatient Hospital | Attending: Internal Medicine | Admitting: Internal Medicine

## 2019-03-14 ENCOUNTER — Inpatient Hospital Stay (HOSPITAL_COMMUNITY): Payer: Medicare HMO

## 2019-03-14 LAB — RENAL FUNCTION PANEL
Albumin: 1.9 g/dL — ABNORMAL LOW (ref 3.5–5.0)
Albumin: 1.9 g/dL — ABNORMAL LOW (ref 3.5–5.0)
Anion gap: 11 (ref 5–15)
Anion gap: 12 (ref 5–15)
BUN: 48 mg/dL — ABNORMAL HIGH (ref 8–23)
BUN: 43 mg/dL — ABNORMAL HIGH (ref 8–23)
CO2: 26 mmol/L (ref 22–32)
CO2: 25 mmol/L (ref 22–32)
Calcium: 8.7 mg/dL — ABNORMAL LOW (ref 8.9–10.3)
Calcium: 8.8 mg/dL — ABNORMAL LOW (ref 8.9–10.3)
Chloride: 99 mmol/L (ref 98–111)
Chloride: 97 mmol/L — ABNORMAL LOW (ref 98–111)
Creatinine, Ser: 1.48 mg/dL — ABNORMAL HIGH (ref 0.44–1.00)
Creatinine, Ser: 1.32 mg/dL — ABNORMAL HIGH (ref 0.44–1.00)
GFR calc Af Amer: 42 mL/min — ABNORMAL LOW (ref >60)
GFR calc Af Amer: 48 mL/min — ABNORMAL LOW (ref >60)
GFR calc non Af Amer: 36 mL/min — ABNORMAL LOW (ref >60)
GFR calc non Af Amer: 41 mL/min — ABNORMAL LOW (ref >60)
Glucose, Bld: 156 mg/dL — ABNORMAL HIGH (ref 70–99)
Glucose, Bld: 187 mg/dL — ABNORMAL HIGH (ref 70–99)
Phosphorus: 4.6 mg/dL (ref 2.5–4.6)
Phosphorus: 4.4 mg/dL (ref 2.5–4.6)
Potassium: 5.3 mmol/L — ABNORMAL HIGH (ref 3.5–5.1)
Potassium: 5.4 mmol/L — ABNORMAL HIGH (ref 3.5–5.1)
Sodium: 136 mmol/L (ref 135–145)
Sodium: 134 mmol/L — ABNORMAL LOW (ref 135–145)

## 2019-03-14 LAB — TYPE AND SCREEN
ABO/RH(D): A POS
Antibody Screen: NEGATIVE
Unit division: 0
Unit division: 0

## 2019-03-14 LAB — POCT I-STAT 7, (LYTES, BLD GAS, ICA,H+H)
Acid-base deficit: 2 mmol/L (ref 0.0–2.0)
Bicarbonate: 28.4 mmol/L — ABNORMAL HIGH (ref 20.0–28.0)
Bicarbonate: 26 mmol/L (ref 20.0–28.0)
Calcium, Ion: 1.23 mmol/L (ref 1.15–1.40)
Calcium, Ion: 1.22 mmol/L (ref 1.15–1.40)
HCT: 27 % — ABNORMAL LOW (ref 36.0–46.0)
HCT: 27 % — ABNORMAL LOW (ref 36.0–46.0)
Hemoglobin: 9.2 g/dL — ABNORMAL LOW (ref 12.0–15.0)
Hemoglobin: 9.2 g/dL — ABNORMAL LOW (ref 12.0–15.0)
O2 Saturation: 100 %
O2 Saturation: 94 %
Patient temperature: 99
Patient temperature: 99.5
Potassium: 5.3 mmol/L — ABNORMAL HIGH (ref 3.5–5.1)
Potassium: 5.2 mmol/L — ABNORMAL HIGH (ref 3.5–5.1)
Sodium: 133 mmol/L — ABNORMAL LOW (ref 135–145)
Sodium: 131 mmol/L — ABNORMAL LOW (ref 135–145)
TCO2: 30 mmol/L (ref 22–32)
TCO2: 28 mmol/L (ref 22–32)
pCO2 arterial: 66.6 mmHg (ref 32.0–48.0)
pCO2 arterial: 59.3 mmHg — ABNORMAL HIGH (ref 32.0–48.0)
pH, Arterial: 7.239 — ABNORMAL LOW (ref 7.350–7.450)
pH, Arterial: 7.252 — ABNORMAL LOW (ref 7.350–7.450)
pO2, Arterial: 326 mmHg — ABNORMAL HIGH (ref 83.0–108.0)
pO2, Arterial: 87 mmHg (ref 83.0–108.0)

## 2019-03-14 LAB — CBC WITH DIFFERENTIAL/PLATELET
Abs Immature Granulocytes: 1.19 10*3/uL — ABNORMAL HIGH (ref 0.00–0.07)
Basophils Absolute: 0.1 10*3/uL (ref 0.0–0.1)
Basophils Relative: 0 %
Eosinophils Absolute: 0 10*3/uL (ref 0.0–0.5)
Eosinophils Relative: 0 %
HCT: 27.7 % — ABNORMAL LOW (ref 36.0–46.0)
Hemoglobin: 8.3 g/dL — ABNORMAL LOW (ref 12.0–15.0)
Immature Granulocytes: 3 %
Lymphocytes Relative: 3 %
Lymphs Abs: 1.3 10*3/uL (ref 0.7–4.0)
MCH: 29 pg (ref 26.0–34.0)
MCHC: 30 g/dL (ref 30.0–36.0)
MCV: 96.9 fL (ref 80.0–100.0)
Monocytes Absolute: 3 10*3/uL — ABNORMAL HIGH (ref 0.1–1.0)
Monocytes Relative: 7 %
Neutro Abs: 35.1 10*3/uL — ABNORMAL HIGH (ref 1.7–7.7)
Neutrophils Relative %: 87 %
Platelets: 97 10*3/uL — ABNORMAL LOW (ref 150–400)
RBC: 2.86 MIL/uL — ABNORMAL LOW (ref 3.87–5.11)
RDW: 15.9 % — ABNORMAL HIGH (ref 11.5–15.5)
WBC: 40.8 10*3/uL — ABNORMAL HIGH (ref 4.0–10.5)
nRBC: 1.3 % — ABNORMAL HIGH (ref 0.0–0.2)

## 2019-03-14 LAB — BPAM RBC
Blood Product Expiration Date: 202008062359
Blood Product Expiration Date: 202008062359
ISSUE DATE / TIME: 202007111409
ISSUE DATE / TIME: 202007132044
Unit Type and Rh: 6200
Unit Type and Rh: 6200

## 2019-03-14 LAB — GLUCOSE, CAPILLARY
Glucose-Capillary: 140 mg/dL — ABNORMAL HIGH (ref 70–99)
Glucose-Capillary: 167 mg/dL — ABNORMAL HIGH (ref 70–99)
Glucose-Capillary: 168 mg/dL — ABNORMAL HIGH (ref 70–99)
Glucose-Capillary: 171 mg/dL — ABNORMAL HIGH (ref 70–99)
Glucose-Capillary: 172 mg/dL — ABNORMAL HIGH (ref 70–99)
Glucose-Capillary: 185 mg/dL — ABNORMAL HIGH (ref 70–99)
Glucose-Capillary: 199 mg/dL — ABNORMAL HIGH (ref 70–99)

## 2019-03-14 LAB — TRIGLYCERIDES: Triglycerides: 126 mg/dL (ref <150)

## 2019-03-14 LAB — PROCALCITONIN: Procalcitonin: 21.69 ng/mL

## 2019-03-14 LAB — ECHOCARDIOGRAM COMPLETE
Height: 66 in
Weight: 2814.83 oz

## 2019-03-14 LAB — MAGNESIUM: Magnesium: 2.5 mg/dL — ABNORMAL HIGH (ref 1.7–2.4)

## 2019-03-14 LAB — APTT: aPTT: 62 seconds — ABNORMAL HIGH (ref 24–36)

## 2019-03-14 MED ORDER — PROPOFOL 1000 MG/100ML IV EMUL
0.0000 ug/kg/min | INTRAVENOUS | Status: DC
  Administered 2019-03-14: 15 ug/kg/min via INTRAVENOUS
  Administered 2019-03-15: 15.664 ug/kg/min via INTRAVENOUS
  Administered 2019-03-15: 15 ug/kg/min via INTRAVENOUS
  Administered 2019-03-15: 15.664 ug/kg/min via INTRAVENOUS
  Administered 2019-03-16: 10 ug/kg/min via INTRAVENOUS
  Administered 2019-03-16: 15 ug/kg/min via INTRAVENOUS
  Filled 2019-03-14 (×5): qty 100

## 2019-03-14 NOTE — Progress Notes (Signed)
Nutrition Follow-up RD working remotely.  DOCUMENTATION CODES:   Not applicable  INTERVENTION:   Continue TF via Cortrak tube:   Vital High Protein at 45 ml/h with Pro-stat 60 ml TID  Provides 1680 kcal, 185 gm protein, 903 ml free water daily.  May need to decrease free water flushes to help with hyponatremia. Recommend 300 ml free water every 6 hours for a total of 2103 ml free water per day.  NUTRITION DIAGNOSIS:   Inadequate oral intake related to inability to eat as evidenced by NPO status.  Ongoing   GOAL:   Patient will meet greater than or equal to 90% of their needs  Met with TF  MONITOR:   Vent status, TF tolerance, Labs, Weight trends, I & O's  ASSESSMENT:   68 year-old female with medical history of type 2 DM, HTN, dyslipidemia, and CKD stage 4 who presented to the hospital initially for evaluation of weakness. She was then found to have fever and AKI and subsequently admitted to [facility], where she was found to be COVID-19 positive. Once her renal function stabilized, she was transferred to [facility] course since admission has been complicated by encephalopathy, angioedema following initiation of Remdesivir-and respiratory failure requiring intubation on 7/4.  Patient remains intubated on ventilator support MV: 8.9 L/min Temp (24hrs), Avg:98.7 F (37.1 C), Min:97 F (36.1 C), Max:99.7 F (37.6 C)   Patient is currently receiving Vital High Protein via Cortrak tube at 45 ml/h (1080 ml/day) with Prostat 60 ml TID to provide 1680 kcals, 185 gm protein, 903 ml free water daily. Free water flushes 300 ml every 4 hours. Receiving 2703 ml free water total.   Receiving CRRT. I/O net + 1.3 L   Labs reviewed. Sodium 131 (L), potassium 5.2 (H), magnesium 2.5 (H) CBG's: 172-140  Medications reviewed and include levophed, vitamin D3, lantus, novolog, miralax, senokot.   Diet Order:   Diet Order            Diet NPO time  specified  Diet effective now              EDUCATION NEEDS:   Not appropriate for education at this time  Skin:  Skin Assessment: Skin Integrity Issues: Skin Integrity Issues:: Stage II Stage II: R buttock  Last BM:  7/14 (type 7)  Height:   Ht Readings from Last 1 Encounters:  03/14/19 '5\' 6"'$  (1.676 m)    Weight:   Wt Readings from Last 1 Encounters:  03/14/19 79.8 kg  03/04/19 74.6 kg  Ideal Body Weight:  59.1 kg  BMI:  Body mass index is 28.4 kg/m.  Estimated Nutritional Needs:   Kcal:  1676 kcal  Protein:  176-192 grams (2.3-2.5 grams/kg)  Fluid:  >/= 1.5 L/day    Molli Barrows, RD, LDN, [place] Pager [phone] After Hours Pager [phone]

## 2019-03-14 NOTE — Progress Notes (Signed)
[facility] Progress Note  Subjective: on vent, 300 negative with CRRT yesterday as needed to go back on pressors. - no mechanical issues with machine. NO UOP.  Getting EEG and head CT for unresponsiveness    Vitals:   03/14/19 1245 03/14/19 1300 03/14/19 1315 03/14/19 1330  BP: (!) 107/47 (!) 97/40 (!) 94/41 (!) 112/39  Pulse: 78 70 67 72  Resp: (!) 26 (!) 23 (!) 28 (!) 26  Temp:      TempSrc:      SpO2: 97% 97% 96% 96%  Weight:      Height:        Inpatient medications: . sodium chloride   Intravenous Once  . chlorhexidine gluconate (MEDLINE KIT)  15 mL Mouth Rinse BID  . Chlorhexidine Gluconate Cloth  6 each Topical Q0600  . cholecalciferol  1,000 Units Per Tube Daily  . [START ON 03/19/2019] darbepoetin (ARANESP) injection - NON-DIALYSIS  150 mcg Subcutaneous Q Sun-1800  . feeding supplement (PRO-STAT SUGAR FREE 64)  60 mL Per Tube TID  . free water  300 mL Per Tube Q4H  . heparin injection (subcutaneous)  7,500 Units Subcutaneous Q8H  . insulin aspart  0-20 Units Subcutaneous Q4H  . insulin glargine  40 Units Subcutaneous BID  . ipratropium-albuterol  3 mL Nebulization Q6H  . mouth rinse  15 mL Mouth Rinse 10 times per day  . pantoprazole sodium  40 mg Per Tube Daily  . polyethylene glycol  17 g Per Tube BID  . senna  2 tablet Per Tube QHS  . sodium chloride flush  10-40 mL Intracatheter Q12H   .  prismasol BGK 4/2.5 400 mL/hr at 03/14/19 0056  .  prismasol BGK 4/2.5 200 mL/hr at 03/14/19 0131  . sodium chloride 5 mL/hr at 03/14/19 1200  . ceFEPime (MAXIPIME) IV Stopped (03/14/19 1127)  . feeding supplement (VITAL HIGH PROTEIN) 1,000 mL (03/13/19 1230)  . fentaNYL infusion INTRAVENOUS Stopped (03/14/19 0824)  . heparin 10,000 units/ 20 mL infusion syringe 500 Units/hr (03/14/19 1200)  . norepinephrine (LEVOPHED) Adult infusion 2 mcg/min (03/14/19 1200)  . prismasol BGK 4/2.5 1,800 mL/hr at 03/14/19 1105  . vancomycin 1,000 mg (03/14/19 1335)   sodium  chloride, acetaminophen **OR** acetaminophen, albuterol, alteplase, bisacodyl, dextrose, EPINEPHrine, fentaNYL (SUBLIMAZE) injection, fentaNYL (SUBLIMAZE) injection, guaiFENesin-dextromethorphan, heparin, heparin, hydrALAZINE, lip balm, midazolam, ondansetron **OR** ondansetron (ZOFRAN) IV, sodium chloride, sodium chloride flush, sodium phosphate    Exam: Due to COVID restrictions and isolation- pt was not seen by in an effort to preserve PPE and to minimize exposure to providers and other patients.  Temp HD cath- right IJ placed 7/11   Home meds of note:  - lisinopril 20 qd/ hydrochlorothiazide 25 qd   - duloxetine 30 qd/ gabapentin 300 tid/ vicodin qid prn  - naproxen 440 bid prn/    UNa 76, UCr 58  (02/27/19)   Date                            Creat               eGFR  2009- 11/2016              1.3- 1.8            32- 50  CKD III  05/2017                        2.61  21  CKD IV  02/13/2019- 03/11/19         7.09 >> 4.14    Renal US >  8.8/ 10 cm kidneys, ^echogenicity c/w CKD, no hydro  CXR 7/11 > Patchy bilateral opacities, left greater than right, worrisome for an infectious process  total I/O 6/29 - 7/10 = 28L in and 27.8 L out, +365  admit wt = 77kg, peak 82kg- now 79.8    FeSat 4% on 6/29, ferr 244   Assessment/ Plan: 1. AKI on CKDIV - baseline creat around 2.5 from 2018 which was CKD IV eGFR 10m/min.  Echogenic kidneys on [place] go along w/ advanced renal failure chronically.  Creat 7 on admission due to dehydration, improved to 2.9 then rose to 4.0 and started CRRT on 7/11.  Plan cont CRRT for now, heparin 500u/hr flat rate. Numbers have improved on CRRT  2. Volume -  CVP - lower  CXR's not helpful w/ bilat infiltrates.  no sig edema on exam. On low dose pressors- now off , keeping even to slightly negative on machine.  3. DM2 on insulin 4. COVID-19 PNA / vent dep resp failure: per primary- WBC very high    5. Anemia ckd + blood draws+ other: possibly CKD related  in part, Hb 9 on admit , now 7's.  Low Fe sat 6/29. Will start esa - inc dose and IV Fe - dont see that it has been given- will order (pharmacy told me we cannot give ) . Transfuse prn- given one unit on 7/11 and 7/13.  6. Dec MS- cannot blame on uremia with better numbers    [facility] 03/14/2019, 1:54 PM  Iron/TIBC/Ferritin/ %Sat    Component Value Date/Time   IRON 14 (L) 02/27/2019 0215   TIBC 350 02/27/2019 0215   FERRITIN 154 03/08/2019 0400   IRONPCTSAT 4 (L) 02/27/2019 0215   Recent Labs  Lab 03/14/19 0420  03/14/19 0557  NA 136   < > 131*  K 5.3*   < > 5.2*  CL 99  --   --   CO2 26  --   --   GLUCOSE 156*  --   --   BUN 48*  --   --   CREATININE 1.48*  --   --   CALCIUM 8.7*  --   --   PHOS 4.6  --   --   ALBUMIN 1.9*  --   --    < > = values in this interval not displayed.   Recent Labs  Lab 03/11/19 0500  AST 32  ALT 48*  ALKPHOS 146*  BILITOT 0.3  PROT 6.0*   Recent Labs  Lab 03/14/19 0420  03/14/19 0557  WBC 40.8*  --   --   HGB 8.3*   < > 9.2*  HCT 27.7*   < > 27.0*  PLT 97*  --   --    < > = values in this interval not displayed.

## 2019-03-14 NOTE — Progress Notes (Signed)
EEG completed, results pending. 

## 2019-03-14 NOTE — Procedures (Signed)
ELECTROENCEPHALOGRAM REPORT   Patient: Nichole Franklin       Room #: 6468 EEG No. ID: [id number] Age: 68 y.o.        Sex: female Referring Physician: [place] Pink Report Date:  03/14/2019        Interpreting Physician: Alexis Goodell  History: Nichole Franklin is an 68 y.o. female with altered mental status  Medications:  Levophed, Heparin, Primasol, Vancomycin, Insulin, Cefepime, Fentanyl  Conditions of Recording:  This is a 21 channel routine scalp EEG performed with bipolar and monopolar montages arranged in accordance to the international 10/20 system of electrode placement. One channel was dedicated to EKG recording.  The patient is in the intubated and sedated state.  Description:  The background activity is slow and poorly organized. It consists of a low voltage mixture of delta and theta activity that is diffusely distributed.  There are occasional, generalized, short periods of attenuation lasting up to one second each.   No epileptiform activity is noted.  Hyperventilation and intermittent photic stimulation were not performed  IMPRESSION: This is an abnormal EEG secondary to general background slowing.  This finding may be seen with a diffuse cerebral disturbance that is etiologically nonspecific, but may include a metabolic encephalopathy or medication effect, among other possibilities.  No epileptiform activity was noted.     Alexis Goodell, MD Neurology [phone] 03/14/2019, 2:50 PM

## 2019-03-14 NOTE — Plan of Care (Signed)

## 2019-03-14 NOTE — Progress Notes (Addendum)
PT Cancellation Note  Patient Details Name: Nichole Franklin MRN: [id number] DOB: 1951/03/23   Cancelled Treatment:    Reason Eval/Treat Not Completed: Medical issues which prohibited therapy. Pt remains intubated, sedated; getting EEG and head CT for unresponsiveness. Will sign off due to medical decline. Please reconsult if pt medically appropriate for PT re-evaluation.  Mabeline Caras, PT, DPT Acute Rehabilitation Services  Pager [phone] Office [place] 03/14/2019, 4:10 PM

## 2019-03-14 NOTE — Progress Notes (Signed)
Spoke with patient's daughter, [name], and provided full update/answered all questions. Assisted [name] with a Facetime video visit.

## 2019-03-14 NOTE — Progress Notes (Signed)
OT Cancellation Note  Patient Details Name: Nichole Franklin MRN: [id number] DOB: 1950/11/18   Cancelled Treatment:    Reason Eval/Treat Not Completed: Medical issues which prohibited therapy. Pt remains intubated, sedated; getting EEG and head CT for unresponsiveness. Will sign off due to medical decline. Please reconsult if pt medically appropriate for OT.   Ramond Dial, OT/L   Acute OT Clinical Specialist Acute Rehabilitation Services Pager [phone] Office [phone]  03/14/2019, 4:21 PM

## 2019-03-14 NOTE — Progress Notes (Signed)
NAME:  Nichole Franklin, MRN:  [id number], DOB:  08/22/1951, LOS: 65 ADMISSION DATE:  02/20/2019, CONSULTATION DATE:  7/4 REFERRING MD:  Aileen Fass, CHIEF COMPLAINT:  Found confused, tongue swelling   Brief History   68 y/o female with extensive PMH admitted June 28 in the setting of a new COVID 19 diagnosis.  Admitted to [facility] due to renal failure which improved with IV fluids.  Transferred to [facility].  On hospital day 6 she developed worsening confusion and tongue swelling felt to be a reaction to remdesivir; then was transferred to the ICU emergently for intubation.   Past Medical History  Hypertension Hyperlipidemia Diabetes mellitus Gastroesophageal reflux disease History of cervical cancer Anxiety  Significant Hospital Events   June 28 admission [facility] from [facility] for AKI, poor po intake June 30 transfer to [facility], confusion July 2: worsening hypoxemia July 3 remdesivir, worsening confusion July 4 tongue swelling, hypertension, severe encephalopathy, transfer to ICU, intubation, new worsening fever - Admitted to the ICU for worsening confusion, required emergent intubation Tongue swelling noted July 5 > July 7 tongue swelling removed, awake, not extubated July 8 extubated, some stridor July 9 mild increased work of breathing, steroids added, did not sleep the night before, trazodone added, mild delirium July 10 worsening delirium, Intubated for inability to protect airway  July 11 worsening renal function, started on CVVHD July 12 worsening hypotension overnight fever July 13 brief bradycardia event with increased work of breathing, breath stacking on vent  Consults:  Nephrology, signed off then reconsulted on 7/11 PCCM  Procedures:  July 4 left IJ CVL July 4 endotracheal tube> 7/8 July 10 ETT >  July 8 Cor-Trak gastric tube July 11 right internal jugular hemodialysis catheter  Significant Diagnostic Tests:  June 28 CT scan abdomen pelvis for  nephrolithiasis: No explanation of renal failure, no urinary calculus, extensive multifocal groundglass opacity in lungs, sclerotic inferior endplate deformities of T11 and T12 July 4 CT Head > pending  Micro Data:  7/4 resp culture > OPF July 11 blood cultures > NGTD  Antimicrobials/COVID treatment  6/28 ceftriaxone/azithro > July 3 7/3 cefepime >  7/4 Linezolid > 7/5  7/3 remdesivir 6/29 solumedrol > 7/10  Interim history/subjective:   July 14 some increased work of breathing again Sedation has been off this morning, no meaningful or purposeful interaction on physical exam Remains on CVVHD No seizure activity   Objective   Blood pressure 96/67, pulse 82, temperature 99.5 F (37.5 C), temperature source Oral, resp. rate (!) 27, height 5\' 6"  (1.676 m), weight 79.8 kg, SpO2 100 %. CVP:  [7 mmHg-10 mmHg] 9 mmHg  Vent Mode: PCV FiO2 (%):  [30 %-100 %] 50 % Set Rate:  [14 bmp-24 bmp] 24 bmp Vt Set:  [350 mL] 350 mL PEEP:  [5 cmH20] 5 cmH20 Plateau Pressure:  [16 cmH20-17 cmH20] 16 cmH20   Intake/Output Summary (Last 24 hours) at 03/14/2019 0753 Last data filed at 03/14/2019 0700 Gross per 24 hour  Intake 4685.75 ml  Output 4999 ml  Net -313.25 ml   Filed Weights   03/12/19 0500 03/13/19 0500 03/14/19 0500  Weight: 82.3 kg 80.4 kg 79.8 kg    Examination:  General:  In bed on vent, increased work of breathing HENT: NCAT ETT in place PULM: CTA B, vent supported breathing > increased work of breathing CV: RRR, no mgr GI: BS+, soft, nontender MSK: normal bulk and tone Neuro: sedated on vent  Chest x-ray images independently reviewed revealing  a patchy infiltrate bilaterally, endotracheal tube in place, OG tube in place, PICC line in place, central venous line for hemodialysis in place, no pleural effusion, cardiac silhouette within normal limits  Resolved Hospital Problem list   Angioedema due to remdesivir, requirement for intubation> resolved Mild stridor 7/9>  resolved  Assessment & Plan:  Acute encephalopathy> persistent, no improvement despite several days of hemodialysis, trying to minimize sedation Insomnia/ICU delirium Neuromuscular weakness Continue efforts to minimize sedation though this is a challenge given ventilator dyssynchrony, see below EEG today, neurology consult today, consider repeat head CT  Worsening acute on chronic renal failure Continue CVVHD  COVID 19 pneumonia> some degree of lung injury noted on CXR/ABG S/p solumedrol 10 days, I don't think there is a role for using more Acute respiratory failure with hypoxemia due to inability to protect airway/COVID 19 pneumonia Remdesivir allergy Vent dyssynchrony/air hunger due to ARDS from COVID Continue full mechanical ventilatory support Monitor SaO2, titrate PEEP and FiO2 per standard protocol Minimize sedation Continue scheduled DuoNeb Continue sedation for vent synchrony> delicate balance using this as we are also trying to minimize sedation to assess neuro function.  However, multiple times this week she has had severe air trapping related to underlying lung disease. duoneb continued Adjust sedation to RASS -2 Add propofol  Fever 7/4> due to COVID Elevated WBC Shock:  Sepsis, unclear source; could this be cardiogenic shock? Myocarditis? PICC Monitor blood culture Continue cefepime Echo Levophed titrated to MAP > 65  I had a lengthy conversation with the patient's daughter today.  I expressed my concern that her overall condition is poor and she is not doing very well with multiorgan failure.  I explained that any sort of recovery would take weeks to months.  I talked about CODE STATUS including whether or not it would be appropriate to perform CPR.  I explained that this would be unlikely to provide medical benefit and would cause harm.  At this point the patient's daughter said she wants everything done for her mother including CPR.  Is not clear that she has an  understanding of the overall severity and poor prognosis despite me attempting to describe this over the last few days.  We will ask palliative medicine to call her to help discuss goals of care.  Overall I do not think tha she has a good chance of surviving this hospitalization.   Best practice:  Diet: continue tube feeding Pain/Anxiety/Delirium protocol (if indicated): RASS -2, continue fentanyl and propofol VAP protocol (if indicated): yes DVT prophylaxis: sub q heparin GI prophylaxis: famotidine Glucose control: SSI Mobility: bed rest Code Status: full Family Communication: [name] updated today, see above Disposition: remain in ICU  Labs   CBC: Recent Labs  Lab 03/10/19 0445  03/11/19 0500  03/11/19 1900 03/12/19 0530 03/13/19 0420 03/13/19 1525 03/13/19 2130 03/13/19 2146 03/14/19 0420 03/14/19 0503 03/14/19 0557  WBC 44.4*  --  39.0*  --  39.6* 46.9* 43.9* 38.4*  --   --  40.8*  --   --   NEUTROABS 38.4*  --  32.9*  --   --  39.9* 37.2*  --   --   --  35.1*  --   --   HGB 7.7*   < > 6.8*   < > 8.1* 8.3* 7.3* 7.1* 7.8* 8.8* 8.3* 9.2* 9.2*  HCT 24.6*   < > 22.2*   < > 25.4* 26.8* 24.8* 24.0* 23.0* 26.0* 27.7* 27.0* 27.0*  MCV 90.4  --  92.5  --  91.4 93.7 94.7 94.5  --   --  96.9  --   --   PLT 181  --  156  --  139* 136* 116* 104*  --   --  97*  --   --    < > = values in this interval not displayed.    Basic Metabolic Panel: Recent Labs  Lab 03/12/19 0530 03/12/19 1545 03/13/19 0420 03/13/19 1525 03/13/19 2130 03/13/19 2146 03/14/19 0420 03/14/19 0503 03/14/19 0557  NA 138  137 139 136 138 133* 133* 136 133* 131*  K 4.9  4.9 4.7 5.1 4.9 5.2* 5.1 5.3* 5.3* 5.2*  CL 102  101 102 101 100  --   --  99  --   --   CO2 25  23 25 26 24   --   --  26  --   --   GLUCOSE 221*  222* 139* 164* 149*  --   --  156*  --   --   BUN 94*  94* 64* 53* 53*  --   --  48*  --   --   CREATININE 2.83*  2.75* 1.98* 1.62* 1.48*  --   --  1.48*  --   --   CALCIUM 8.2*   8.1* 8.2* 8.3* 8.6*  --   --  8.7*  --   --   MG 2.3  --  2.3  --   --   --   --   --   --   PHOS 5.1* 3.7 4.1 3.7  --   --  4.6  --   --    GFR: Estimated Creatinine Clearance: 38.8 mL/min (A) (by C-G formula based on SCr of 1.48 mg/dL (H)). Recent Labs  Lab 03/10/19 0445 03/11/19 0500  03/12/19 0530 03/13/19 0420 03/13/19 1525 03/14/19 0420  PROCALCITON 2.45 2.65  --   --  23.09  --  21.69  WBC 44.4* 39.0*   < > 46.9* 43.9* 38.4* 40.8*  LATICACIDVEN  --   --   --   --  1.8  --   --    < > = values in this interval not displayed.    Liver Function Tests: Recent Labs  Lab 03/08/19 0400 03/11/19 0500  03/12/19 0530 03/12/19 1545 03/13/19 0420 03/13/19 1525 03/14/19 0420  AST 35 32  --   --   --   --   --   --   ALT 65* 48*  --   --   --   --   --   --   ALKPHOS 192* 146*  --   --   --   --   --   --   BILITOT 0.6 0.3  --   --   --   --   --   --   PROT 6.2* 6.0*  --   --   --   --   --   --   ALBUMIN 2.6* 2.1*   < > 2.2* 2.0* 2.0* 2.1* 1.9*   < > = values in this interval not displayed.   No results for input(s): LIPASE, AMYLASE in the last 168 hours. Recent Labs  Lab 03/11/19 0500  AMMONIA 45*    ABG    Component Value Date/Time   PHART 7.252 (L) 03/14/2019 0557   PCO2ART 59.3 (H) 03/14/2019 0557   PO2ART 87.0 03/14/2019 0557   HCO3 26.0 03/14/2019 0557   TCO2 28 03/14/2019 0557  ACIDBASEDEF 2.0 03/14/2019 0557   O2SAT 94.0 03/14/2019 0557     Coagulation Profile: No results for input(s): INR, PROTIME in the last 168 hours.  Cardiac Enzymes: No results for input(s): CKTOTAL, CKMB, CKMBINDEX, TROPONINI in the last 168 hours.  HbA1C: Hgb A1c MFr Bld  Date/Time Value Ref Range Status  02/27/2019 02:15 AM 9.3 (H) 4.8 - 5.6 % Final    Comment:    (NOTE) Pre diabetes:          5.7%-6.4% Diabetes:              >6.4% Glycemic control for   <7.0% adults with diabetes   08/05/2010 05:10 AM (H) <5.7 % Final   8.0 (NOTE)                                                                        According to the ADA Clinical Practice Recommendations for 2011, when HbA1c is used as a screening test:   >=6.5%   Diagnostic of Diabetes Mellitus           (if abnormal result  is confirmed)  5.7-6.4%   Increased risk of developing Diabetes Mellitus  References:Diagnosis and Classification of Diabetes Mellitus,Diabetes JQZE,0923,30(QTMAU 1):S62-S69 and Standards of Medical Care in         Diabetes - 2011,Diabetes Care,2011,34  (Suppl 1):S11-S61.    CBG: Recent Labs  Lab 03/13/19 1200 03/13/19 1557 03/13/19 1937 03/14/19 0019 03/14/19 0421  GLUCAP 214* 192* 195* 168* 167*       Critical care time: 60 minutes    Roselie Awkward, MD [place] [facility] Pager: [phone] Cell: [phone] If no response, call [phone]

## 2019-03-14 NOTE — Progress Notes (Addendum)
PROGRESS NOTE  Nichole Franklin HDQ:[id number] DOB: 01-Dec-1950 DOA: 01/31/2019  PCP: Elwyn Reach, MD  Brief History/Interval Summary: Patient is a 68 y.o. female with PMHx of DM-2, HTN, dyslipidemia, CKD stage IV who presented to the hospital initially for evaluation of weakness, she was found to have fever and acute kidney injury.  She was subsequently admitted to [facility] her renal function stabilized-she was transferred to [facility] course since admission has been complicated by encephalopathy, angioedema following initiation of Remdesivir-and respiratory failure requiring intubation on 7/4.  Reason for Visit: Acute respiratory disease due to COVID-19  Consultants: Pulmonology.  Nephrology  Procedures:  Intubation 7/4 Central line left IJ 7/4-->7/11 Hemodialysis catheter placement on 7/11 CRRT 7/11  Antibiotics: Anti-infectives (From admission, onward)   Start     Dose/Rate Route Frequency Ordered Stop   03/13/19 1400  vancomycin (VANCOCIN) IVPB 1000 mg/200 mL premix     1,000 mg 200 mL/hr over 60 Minutes Intravenous Every 24 hours 03/12/19 1151     03/12/19 1100  vancomycin (VANCOCIN) 1,750 mg in sodium chloride 0.9 % 500 mL IVPB     1,750 mg 250 mL/hr over 120 Minutes Intravenous  Once 03/12/19 1056 03/12/19 1500   03/11/19 2230  ceFEPIme (MAXIPIME) 2 g in sodium chloride 0.9 % 100 mL IVPB     2 g 200 mL/hr over 30 Minutes Intravenous Every 12 hours 03/11/19 2150     03/07/19 1000  ceFEPIme (MAXIPIME) 2 g in sodium chloride 0.9 % 100 mL IVPB  Status:  Discontinued     2 g 200 mL/hr over 30 Minutes Intravenous Every 24 hours 03/06/19 1434 03/11/19 2150   03/04/19 2200  ceFEPIme (MAXIPIME) 1 g in sodium chloride 0.9 % 100 mL IVPB  Status:  Discontinued     1 g 200 mL/hr over 30 Minutes Intravenous Every 12 hours 03/04/19 1256 03/06/19 1434   03/04/19 1400  remdesivir 100 mg in sodium chloride 0.9 % 250 mL IVPB  Status:  Discontinued      100 mg 500 mL/hr over 30 Minutes Intravenous Every 24 hours 03/03/19 1305 03/04/19 0936   03/04/19 1200  linezolid (ZYVOX) IVPB 600 mg  Status:  Discontinued     600 mg 300 mL/hr over 60 Minutes Intravenous Every 12 hours 03/04/19 1141 03/05/19 1100   03/04/19 0900  linezolid (ZYVOX) IVPB 600 mg  Status:  Discontinued     600 mg 300 mL/hr over 60 Minutes Intravenous Every 12 hours 03/04/19 0759 03/04/19 1141   03/04/19 0800  ceFEPIme (MAXIPIME) 2 g in sodium chloride 0.9 % 100 mL IVPB  Status:  Discontinued     2 g 200 mL/hr over 30 Minutes Intravenous Every 12 hours 03/04/19 0706 03/04/19 1256   03/03/19 1400  remdesivir 200 mg in sodium chloride 0.9 % 250 mL IVPB     200 mg 500 mL/hr over 30 Minutes Intravenous Once 03/03/19 1305 03/03/19 1500   02/27/19 2000  azithromycin (ZITHROMAX) 500 mg in sodium chloride 0.9 % 250 mL IVPB  Status:  Discontinued     500 mg 250 mL/hr over 60 Minutes Intravenous Every 24 hours 02/27/19 0428 03/04/19 1211   02/27/19 0430  cefTRIAXone (ROCEPHIN) 1 g in sodium chloride 0.9 % 100 mL IVPB  Status:  Discontinued     1 g 200 mL/hr over 30 Minutes Intravenous Every 24 hours 02/27/19 0428 03/04/19 0706       Subjective/Interval History: Patient remains intubated and sedated.  Over the course of the night she was noted to have a leftward gaze.  She has not responded despite decreasing sedation.   Assessment/Plan:  Acute Hypoxic Resp. Failure due to Acute Covid 19 Viral Illness/sepsis  Vent Mode: PCV FiO2 (%):  [30 %-100 %] 50 % Set Rate:  [14 bmp-24 bmp] 24 bmp PEEP:  [5 cmH20] 5 cmH20 Plateau Pressure:  [16 cmH20-17 cmH20] 16 cmH20     Component Value Date/Time   PHART 7.252 (L) 03/14/2019 0557   PCO2ART 59.3 (H) 03/14/2019 0557   PO2ART 87.0 03/14/2019 0557   HCO3 26.0 03/14/2019 0557   TCO2 28 03/14/2019 0557   ACIDBASEDEF 2.0 03/14/2019 0557   O2SAT 94.0 03/14/2019 0557    COVID-19 Labs  Recent Labs    03/12/19 0530  DDIMER  5.98*    Lab Results  Component Value Date   SARSCOV2NAA POSITIVE (A) 02/13/2019     Fever: Hypothermia appears to have resolved.  T-max of 101.5 F on 7/11 Oxygen requirements: On mechanical ventilation.  40% FiO2.  Saturating in the 90s.     Antibiotics: Cefepime, day 11.  Vancomycin was added 7/12.   Remdesivir: Patient developed angioedema to Remdesivir Steroids: She has been taken off of steroids due to encephalopathy Diuretics: She has received furosemide during this hospitalization. Last dose was on 7/9 Actemra: Did not receive Convalescent Plasma: Did not receive Vitamin C and Zinc: Continue DVT Prophylaxis: On heparin 7500 units subcutaneously every 8 hours  Patient was initially intubated due to angioedema thought to be secondary to Remdesivir.  Angioedema resolved.  Patient was extubated on 7/8.  She did have some stridor at the same evening which was treated with nebulizer treatments and steroids.  This improved.  However her mentation continued to get worse.  She was unable to protect her airway.  She was reintubated on 7/10.   Patient remains on mechanical ventilation.  Pulmonology is following.  Patient developed a fever couple of days ago.  Concern was for sepsis.  Patient was started on vancomycin.  She was continued on cefepime.  She also had significant leukocytosis.  WBC remains elevated at 40.8 today.  Procalcitonin level significantly elevated at 21.69.  It was 23.09 yesterday.  Blood cultures negative so far.  Acute kidney injury on chronic kidney disease stage IV Baseline creatinine around 1.6-2.  She initially presented with a BUN of 81 and a creatinine of 7.09.  Renal function had improved.  But then it started getting worse again a few days ago.  She had a decrease in her urine output.  Nephrology was consulted.  Dialysis catheter was placed.  Patient was started on CRRT.  Nephrology continues to follow.    Thrombocytopenia Drop in platelet counts noted.   Heparin is currently on hold.  Recheck labs tomorrow.  Acute metabolic encephalopathy She had encephalopathy earlier during the course of the hospitalization thought to be due to hypoxemia and other acute metabolic derangement.  CT scan done on 7/4 did not show any acute findings.  After extubation her mentation has been progressively getting worse.  She had a CT scan repeated on 7/10 which did not show any acute findings.  She had to be reintubated as she was not able to protect her airway.  Mentation could also be altered due to uremia.  Steroids were discontinued.  Her BUN has improved with CRRT.  No change in mental status and actually some concern for worsening overnight.  She has a left-sided gaze now.  CT scan to be repeated.  EEG.  Discussed briefly with neurology as well.  They will consult depending on findings on CT scan.    Septic shock Continues to require Levophed.  Procalcitonin noted to be elevated.  Normal lactic acid level noted.  Continue vancomycin and cefepime.  WBC remains elevated without significant change.  Slight downward trend has been noted over the last 3 days.  Elevated d-dimer Patient was empirically started on IV heparin on 7/4 due to concern for VTE.  Due to drop in hemoglobin this was discontinued.  Lower extremity Doppler study was negative for DVT.  Clinically there was low suspicion for VTE.  D-dimer continues to trend down.    Angioedema Most likely secondary to Remdesivir.  Tongue swelling has improved.  Hypernatremia Resolved with free water.  Monitor electrolytes closely.  Diabetes mellitus type 2 with uncontrolled hyperglycemia HbA1c 9.3.  Patient is on Lantus insulin and SSI.  CBGs are reasonably well controlled.  Initially was on IV insulin.    Normocytic anemia  Patient has required transfusion of 3 PRBCs (7/6, 7/11, 7/13).  No evidence for overt bleeding noted.  Hemoglobin has responded appropriately to blood transfusion.  Nutrition Being fed  through a feeding tube.    DVT Prophylaxis: Subcutaneous Heparin was placed on hold overnight.  Will resume depending on CT scan findings. PUD Prophylaxis: Protonix Code Status: Full code Family Communication: Pulmonology has been discussing with patient's family. Disposition Plan: Remain in ICU for now   Medications:  Scheduled: . sodium chloride   Intravenous Once  . chlorhexidine gluconate (MEDLINE KIT)  15 mL Mouth Rinse BID  . Chlorhexidine Gluconate Cloth  6 each Topical Q0600  . cholecalciferol  1,000 Units Per Tube Daily  . [START ON 03/19/2019] darbepoetin (ARANESP) injection - NON-DIALYSIS  150 mcg Subcutaneous Q Sun-1800  . feeding supplement (PRO-STAT SUGAR FREE 64)  60 mL Per Tube TID  . free water  300 mL Per Tube Q4H  . heparin injection (subcutaneous)  7,500 Units Subcutaneous Q8H  . insulin aspart  0-20 Units Subcutaneous Q4H  . insulin glargine  40 Units Subcutaneous BID  . ipratropium-albuterol  3 mL Nebulization Q6H  . mouth rinse  15 mL Mouth Rinse 10 times per day  . pantoprazole sodium  40 mg Per Tube Daily  . polyethylene glycol  17 g Per Tube BID  . senna  2 tablet Per Tube QHS  . sodium chloride flush  10-40 mL Intracatheter Q12H   Continuous: .  prismasol BGK 4/2.5 400 mL/hr at 03/14/19 0056  .  prismasol BGK 4/2.5 200 mL/hr at 03/14/19 0131  . sodium chloride 5 mL/hr at 03/14/19 0900  . ceFEPime (MAXIPIME) IV Stopped (03/13/19 2319)  . feeding supplement (VITAL HIGH PROTEIN) 1,000 mL (03/13/19 1230)  . fentaNYL infusion INTRAVENOUS Stopped (03/14/19 0824)  . heparin 10,000 units/ 20 mL infusion syringe 500 Units/hr (03/14/19 0900)  . norepinephrine (LEVOPHED) Adult infusion 4 mcg/min (03/14/19 0900)  . prismasol BGK 4/2.5 1,800 mL/hr at 03/14/19 0811  . vancomycin Stopped (03/13/19 1638)   PRN:sodium chloride, acetaminophen **OR** acetaminophen, albuterol, alteplase, bisacodyl, dextrose, EPINEPHrine, fentaNYL (SUBLIMAZE) injection, fentaNYL  (SUBLIMAZE) injection, guaiFENesin-dextromethorphan, heparin, heparin, hydrALAZINE, lip balm, midazolam, ondansetron **OR** ondansetron (ZOFRAN) IV, sodium chloride, sodium chloride flush, sodium phosphate   Objective:  Vital Signs  Vitals:   03/14/19 0815 03/14/19 0830 03/14/19 0845 03/14/19 0900  BP: (!) 124/51 (!) 131/109 (!) 92/46 (!) 95/42  Pulse: 84 86 91 83  Resp: (!)   25 (!) 25 (!) 28 (!) 24  Temp:      TempSrc:      SpO2: 100% 99% 99% 99%  Weight:      Height:        Intake/Output Summary (Last 24 hours) at 03/14/2019 1056 Last data filed at 03/14/2019 0900 Gross per 24 hour  Intake 4484.08 ml  Output 4972 ml  Net -487.92 ml   Filed Weights   03/12/19 0500 03/13/19 0500 03/14/19 0500  Weight: 82.3 kg 80.4 kg 79.8 kg    General appearance: Intubated Left-sided gaze noted. pupils are equal. Resp: Coarse breath sounds bilaterally.  No wheezing rales or rhonchi.  Crackles at the bases. Cardio: S1-S2 is normal regular.  No S3-S4.  No rubs murmurs or bruit GI: Abdomen is soft.  Nontender nondistended.  Bowel sounds are present normal.  No masses organomegaly Neurologic: Left-sided gaze noted.  She occasionally moves her left side.  Plantars seem to be upgoing.    Lab Results:  Data Reviewed: I have personally reviewed following labs and imaging studies  CBC: Recent Labs  Lab 03/10/19 0445  03/11/19 0500  03/11/19 1900 03/12/19 0530 03/13/19 0420 03/13/19 1525 03/13/19 2130 03/13/19 2146 03/14/19 0420 03/14/19 0503 03/14/19 0557  WBC 44.4*  --  39.0*  --  39.6* 46.9* 43.9* 38.4*  --   --  40.8*  --   --   NEUTROABS 38.4*  --  32.9*  --   --  39.9* 37.2*  --   --   --  35.1*  --   --   HGB 7.7*   < > 6.8*   < > 8.1* 8.3* 7.3* 7.1* 7.8* 8.8* 8.3* 9.2* 9.2*  HCT 24.6*   < > 22.2*   < > 25.4* 26.8* 24.8* 24.0* 23.0* 26.0* 27.7* 27.0* 27.0*  MCV 90.4  --  92.5  --  91.4 93.7 94.7 94.5  --   --  96.9  --   --   PLT 181  --  156  --  139* 136* 116* 104*  --   --   97*  --   --    < > = values in this interval not displayed.    Basic Metabolic Panel: Recent Labs  Lab 03/12/19 0530 03/12/19 1545 03/13/19 0420 03/13/19 1525 03/13/19 2130 03/13/19 2146 03/14/19 0420 03/14/19 0503 03/14/19 0557  NA 138  137 139 136 138 133* 133* 136 133* 131*  K 4.9  4.9 4.7 5.1 4.9 5.2* 5.1 5.3* 5.3* 5.2*  CL 102  101 102 101 100  --   --  99  --   --   CO2 _0 --   --  26  --   --   GLUCOSE 221*  222* 139* 164* 149*  --   --  156*  --   --   BUN 94*  94* 64* 53* 53*  --   --  48*  --   --   CREATININE 2.83*  2.75* 1.98* 1.62* 1.48*  --   --  1.48*  --   --   CALCIUM 8.2*  8.1* 8.2* 8.3* 8.6*  --   --  8.7*  --   --   MG 2.3  --  2.3  --   --   --  2.5*  --   --   PHOS 5.1* 3.7 4.1 3.7  --   --  4.6  --   --  GFR: Estimated Creatinine Clearance: 38.8 mL/min (A) (by C-G formula based on SCr of 1.48 mg/dL (H)).  Liver Function Tests: Recent Labs  Lab 03/08/19 0400 03/11/19 0500  03/12/19 0530 03/12/19 1545 03/13/19 0420 03/13/19 1525 03/14/19 0420  AST 35 32  --   --   --   --   --   --   ALT 65* 48*  --   --   --   --   --   --   ALKPHOS 192* 146*  --   --   --   --   --   --   BILITOT 0.6 0.3  --   --   --   --   --   --   PROT 6.2* 6.0*  --   --   --   --   --   --   ALBUMIN 2.6* 2.1*   < > 2.2* 2.0* 2.0* 2.1* 1.9*   < > = values in this interval not displayed.     CBG: Recent Labs  Lab 03/13/19 1557 03/13/19 1937 03/14/19 0019 03/14/19 0421 03/14/19 0750  GLUCAP 192* 195* 168* 167* 172*      Recent Results (from the past 240 hour(s))  Culture, respiratory (non-expectorated)     Status: None   Collection Time: 03/04/19 11:41 AM   Specimen: Tracheal Aspirate; Respiratory  Result Value Ref Range Status   Specimen Description   Final    TRACHEAL ASPIRATE Performed at [facility], [address] [place], [place] [address]    Special Requests   Final    NONE Performed at [facility], [address] [place], [place] [address]    Gram Stain   Final    RARE WBC PRESENT, PREDOMINANTLY PMN MODERATE SQUAMOUS EPITHELIAL CELLS PRESENT ABUNDANT GRAM POSITIVE COCCI    Culture   Final    MODERATE Consistent with normal respiratory flora. Performed at [facility], [address] [place], [place] [address]    Report Status 03/07/2019 FINAL  Final  MRSA PCR Screening     Status: None   Collection Time: 03/04/19  7:08 PM   Specimen: Nasal Mucosa; Nasopharyngeal  Result Value Ref Range Status   MRSA by PCR NEGATIVE NEGATIVE Final    Comment:        The GeneXpert MRSA Assay (FDA approved for NASAL specimens only), is one component of a comprehensive MRSA colonization surveillance program. It is not intended to diagnose MRSA infection nor to guide or monitor treatment for MRSA infections. Performed at [facility], [address] [place], [place] [address]   Culture, blood (routine x 2)     Status: None (Preliminary result)   Collection Time: 03/11/19 10:18 PM   Specimen: BLOOD  Result Value Ref Range Status   Specimen Description BLOOD RIGHT ANTECUBITAL  Final   Special Requests   Final    BOTTLES DRAWN AEROBIC AND ANAEROBIC Blood Culture adequate volume   Culture   Final    NO GROWTH 1 DAY Performed at [facility], [address] ,  [address]    Report Status PENDING  Incomplete  Culture, blood (routine x 2)     Status: None (Preliminary result)   Collection Time: 03/11/19 10:23 PM   Specimen: BLOOD  Result Value Ref Range Status   Specimen Description BLOOD RIGHT ANTECUBITAL  Final   Special Requests   Final    BOTTLES DRAWN   AEROBIC AND ANAEROBIC Blood Culture adequate volume   Culture   Final    NO GROWTH 1 DAY Performed at [facility], [place] [address] [place], [place] [address]    Report Status PENDING  Incomplete      Radiology Studies: Dg Chest Port 1 View  Result  Date: 03/14/2019 CLINICAL DATA:  68 year old female with COVID-19, decreased breath sounds. EXAM: PORTABLE CHEST 1 VIEW COMPARISON:  03/13/2019. FINDINGS: Portable AP semi upright view at 0053 hours. ET tube tip just below the clavicles. Enteric feeding tube courses to the abdomen, tip not included. Stable right IJ central line and PICC line. Stable to mildly larger lung volumes. Normal cardiac size and mediastinal contours. Stable to mildly regressed patchy perihilar and peripheral bilateral pulmonary opacity, greater on the left. No pneumothorax, pulmonary edema, pleural effusion or areas of worsening ventilation. Paucity of bowel gas in the upper abdomen. No acute osseous abnormality identified. IMPRESSION: 1. Stable lines and tubes. 2. Stable to mildly improved ventilation since yesterday. No new cardiopulmonary abnormality. Electronically Signed   By: Genevie Ann M.D.   On: 03/14/2019 01:14   Dg Chest Port 1 View  Result Date: 03/13/2019 CLINICAL DATA:  Pneumonia due to COVID-19 virus. EXAM: PORTABLE CHEST 1 VIEW COMPARISON:  Radiograph of March 11, 2019. FINDINGS: The heart size and mediastinal contours are within normal limits. Endotracheal and feeding tubes are unchanged in position. Right internal jugular catheter is unchanged in position. Left-sided internal jugular catheter has been removed. Interval placement of right-sided PICC line with distal tip in expected position of cavoatrial junction. Increased bilateral lung opacities are noted consistent with worsening multifocal pneumonia. The visualized skeletal structures are unremarkable. IMPRESSION: Interval placement of right sided PICC line. Otherwise stable support apparatus. Worsening bilateral lung opacities are noted most consistent with worsening multifocal pneumonia. Electronically Signed   By: Marijo Conception M.D.   On: 03/13/2019 08:10       LOS: 16 days   Casha Estupinan [employer] on [email or web address]  03/14/2019, 10:56 AM

## 2019-03-14 NOTE — Progress Notes (Signed)
Changes made d/t ABG results.  RT will obtain another ABG within 1 hour.

## 2019-03-14 NOTE — Progress Notes (Signed)
Pt Ve dropping on PC of 5.  Ve was at 4.2-6.2L.  RT increased PC to increase Ve.  RT will follow with ABG within 1 hour.

## 2019-03-15 DIAGNOSIS — Z7189 Other specified counseling: Secondary | ICD-10-CM

## 2019-03-15 LAB — GLUCOSE, CAPILLARY
Glucose-Capillary: 189 mg/dL — ABNORMAL HIGH (ref 70–99)
Glucose-Capillary: 162 mg/dL — ABNORMAL HIGH (ref 70–99)
Glucose-Capillary: 174 mg/dL — ABNORMAL HIGH (ref 70–99)
Glucose-Capillary: 162 mg/dL — ABNORMAL HIGH (ref 70–99)
Glucose-Capillary: 171 mg/dL — ABNORMAL HIGH (ref 70–99)
Glucose-Capillary: 172 mg/dL — ABNORMAL HIGH (ref 70–99)

## 2019-03-15 LAB — RENAL FUNCTION PANEL
Albumin: 1.8 g/dL — ABNORMAL LOW (ref 3.5–5.0)
Albumin: 1.7 g/dL — ABNORMAL LOW (ref 3.5–5.0)
Anion gap: 9 (ref 5–15)
Anion gap: 7 (ref 5–15)
BUN: 49 mg/dL — ABNORMAL HIGH (ref 8–23)
BUN: 44 mg/dL — ABNORMAL HIGH (ref 8–23)
CO2: 26 mmol/L (ref 22–32)
CO2: 27 mmol/L (ref 22–32)
Calcium: 8.9 mg/dL (ref 8.9–10.3)
Calcium: 9 mg/dL (ref 8.9–10.3)
Chloride: 98 mmol/L (ref 98–111)
Chloride: 97 mmol/L — ABNORMAL LOW (ref 98–111)
Creatinine, Ser: 1.26 mg/dL — ABNORMAL HIGH (ref 0.44–1.00)
Creatinine, Ser: 1.14 mg/dL — ABNORMAL HIGH (ref 0.44–1.00)
GFR calc Af Amer: 51 mL/min — ABNORMAL LOW (ref >60)
GFR calc Af Amer: 57 mL/min — ABNORMAL LOW (ref >60)
GFR calc non Af Amer: 44 mL/min — ABNORMAL LOW (ref >60)
GFR calc non Af Amer: 49 mL/min — ABNORMAL LOW (ref >60)
Glucose, Bld: 207 mg/dL — ABNORMAL HIGH (ref 70–99)
Glucose, Bld: 176 mg/dL — ABNORMAL HIGH (ref 70–99)
Phosphorus: 3.1 mg/dL (ref 2.5–4.6)
Phosphorus: 3.3 mg/dL (ref 2.5–4.6)
Potassium: 4.9 mmol/L (ref 3.5–5.1)
Potassium: 4.5 mmol/L (ref 3.5–5.1)
Sodium: 133 mmol/L — ABNORMAL LOW (ref 135–145)
Sodium: 131 mmol/L — ABNORMAL LOW (ref 135–145)

## 2019-03-15 LAB — MAGNESIUM: Magnesium: 2.5 mg/dL — ABNORMAL HIGH (ref 1.7–2.4)

## 2019-03-15 LAB — POCT I-STAT 7, (LYTES, BLD GAS, ICA,H+H)
Bicarbonate: 26.4 mmol/L (ref 20.0–28.0)
Calcium, Ion: 1.27 mmol/L (ref 1.15–1.40)
HCT: 25 % — ABNORMAL LOW (ref 36.0–46.0)
Hemoglobin: 8.5 g/dL — ABNORMAL LOW (ref 12.0–15.0)
O2 Saturation: 91 %
Patient temperature: 36.8
Potassium: 4.6 mmol/L (ref 3.5–5.1)
Sodium: 132 mmol/L — ABNORMAL LOW (ref 135–145)
TCO2: 28 mmol/L (ref 22–32)
pCO2 arterial: 49.6 mmHg — ABNORMAL HIGH (ref 32.0–48.0)
pH, Arterial: 7.333 — ABNORMAL LOW (ref 7.350–7.450)
pO2, Arterial: 66 mmHg — ABNORMAL LOW (ref 83.0–108.0)

## 2019-03-15 LAB — CBC WITH DIFFERENTIAL/PLATELET
Abs Immature Granulocytes: 0.81 10*3/uL — ABNORMAL HIGH (ref 0.00–0.07)
Basophils Absolute: 0.1 10*3/uL (ref 0.0–0.1)
Basophils Relative: 0 %
Eosinophils Absolute: 0 10*3/uL (ref 0.0–0.5)
Eosinophils Relative: 0 %
HCT: 25.9 % — ABNORMAL LOW (ref 36.0–46.0)
Hemoglobin: 7.6 g/dL — ABNORMAL LOW (ref 12.0–15.0)
Immature Granulocytes: 2 %
Lymphocytes Relative: 4 %
Lymphs Abs: 1.5 10*3/uL (ref 0.7–4.0)
MCH: 28.5 pg (ref 26.0–34.0)
MCHC: 29.3 g/dL — ABNORMAL LOW (ref 30.0–36.0)
MCV: 97 fL (ref 80.0–100.0)
Monocytes Absolute: 2.3 10*3/uL — ABNORMAL HIGH (ref 0.1–1.0)
Monocytes Relative: 6 %
Neutro Abs: 33.8 10*3/uL — ABNORMAL HIGH (ref 1.7–7.7)
Neutrophils Relative %: 88 %
Platelets: 77 10*3/uL — ABNORMAL LOW (ref 150–400)
RBC: 2.67 MIL/uL — ABNORMAL LOW (ref 3.87–5.11)
RDW: 16 % — ABNORMAL HIGH (ref 11.5–15.5)
WBC: 38.6 10*3/uL — ABNORMAL HIGH (ref 4.0–10.5)
nRBC: 0.9 % — ABNORMAL HIGH (ref 0.0–0.2)

## 2019-03-15 LAB — APTT: aPTT: 50 seconds — ABNORMAL HIGH (ref 24–36)

## 2019-03-15 MED ORDER — FREE WATER
300.0000 mL | Freq: Three times a day (TID) | Status: DC
  Administered 2019-03-15 – 2019-03-17 (×6): 300 mL

## 2019-03-15 NOTE — Progress Notes (Signed)
NAME:  Nichole Franklin, MRN:  [id number], DOB:  01-Mar-1951, LOS: 39 ADMISSION DATE:  02/28/2019, CONSULTATION DATE:  7/4 REFERRING MD:  Aileen Fass, CHIEF COMPLAINT:  Found confused, tongue swelling   Brief History   68 y/o female with extensive PMH admitted June 28 in the setting of a new COVID 19 diagnosis.  Admitted to [facility] due to renal failure which improved with IV fluids.  Transferred to [facility].  On hospital day 6 she developed worsening confusion and tongue swelling felt to be a reaction to remdesivir; then was transferred to the ICU emergently for intubation.   Past Medical History  Hypertension Hyperlipidemia Diabetes mellitus Gastroesophageal reflux disease History of cervical cancer Anxiety  Significant Hospital Events   June 28 admission [facility] from [facility] for AKI, poor po intake June 30 transfer to [facility], confusion July 2: worsening hypoxemia July 3 remdesivir, worsening confusion July 4 tongue swelling, hypertension, severe encephalopathy, transfer to ICU, intubation, new worsening fever - Admitted to the ICU for worsening confusion, required emergent intubation Tongue swelling noted July 5 > July 7 tongue swelling removed, awake, not extubated July 8 extubated, some stridor July 9 mild increased work of breathing, steroids added, did not sleep the night before, trazodone added, mild delirium July 10 worsening delirium, Intubated for inability to protect airway  July 11 worsening renal function, started on CVVHD July 12 worsening hypotension overnight fever July 13 brief bradycardia event with increased work of breathing, breath stacking on vent July 14: v July 14 some increased work of breathing again Sedation has been off this morning, no meaningful or purposeful interaction on physical exam Remains on CVVHD No seizure activity Full code per goals of care   Consults:  Nephrology, signed off then reconsulted on 7/11 PCCM  Procedures:   July 4 left IJ CVL July 4 endotracheal tube> 7/8 July 10 ETT >  July 8 Cor-Trak gastric tube July 11 right internal jugular hemodialysis catheter  Significant Diagnostic Tests:  June 28 CT scan abdomen pelvis for nephrolithiasis: No explanation of renal failure, no urinary calculus, extensive multifocal groundglass opacity in lungs, sclerotic inferior endplate deformities of T11 and T12 July 4 CT Head > pending  Micro Data:  7/4 resp culture > OPF July 11 blood cultures > NGTD  Antimicrobials/COVID treatment  6/28 ceftriaxone/azithro > July 3 7/3 cefepime >  7/4 Linezolid > 7/5  7/3 remdesivir 6/29 solumedrol > 7/10  March 13, 2019: Vancomycin   Interim history/subjective:   March 15, 2019: Remains on CRRT.  On fentanyl drip and propofol drip.  40% oxygen on the ventilator.  On Levophed.  Withdraws to pain.  Has dark toes from the Levophed.  EEG apparently showed slow waves.  Procalcitonin high at 21 afebrile since July 11 with reducing white count.  On vancomycin and cefepime Objective   Blood pressure (!) 95/38, pulse 63, temperature 98 F (36.7 C), temperature source Oral, resp. rate (!) 24, height 5\' 6"  (1.676 m), weight 76.7 kg, SpO2 93 %.    Vent Mode: PRVC FiO2 (%):  [40 %] 40 % Set Rate:  [24 bmp] 24 bmp Vt Set:  [410 mL] 410 mL PEEP:  [5 cmH20] 5 cmH20 Plateau Pressure:  [20 cmH20-30 cmH20] 30 cmH20   Intake/Output Summary (Last 24 hours) at 03/15/2019 1705 Last data filed at 03/15/2019 1700 Gross per 24 hour  Intake 2792.49 ml  Output 2271 ml  Net 521.49 ml   Filed Weights   03/13/19 0500 03/14/19 0500  03/15/19 0434  Weight: 80.4 kg 79.8 kg 76.7 kg    Examination: General Appearance:  Looks criticall ill OBESE - + Head:  Normocephalic, without obvious abnormality, atraumatic Eyes:  PERRL -, conjunctiva/corneas - muddy     Ears:  Normal external ear canals, both ears Nose:  G tube - n Throat:  ETT TUBE - yes , OG tube - yes Neck:  Supple,  No  enlargement/tenderness/nodules Lungs: Clear to auscultation bilaterally, Ventilator   Synchrony - yes Heart:  S1 and S2 normal, no murmur, CVP - x.  Pressors -yes on Levophed Abdomen:  Soft, no masses, no organomegaly Genitalia / Rectal:  Not done Extremities:  Extremities- intact with dark toes Skin:  ntact in exposed areas . Sacral area - not examined Neurologic:  Sedation -fentanyl drip and propofol drip with Levophed-> RASS - -4   LABS    PULMONARY Recent Labs  Lab 03/11/19 1539 03/13/19 2130 03/13/19 2146 03/14/19 0503 03/14/19 0557 03/15/19 0612  PHART 7.325*  --  7.306* 7.239* 7.252* 7.333*  PCO2ART 39.1  --  59.5* 66.6* 59.3* 49.6*  PO2ART 73.0*  --  458.0* 326.0* 87.0 66.0*  HCO3 20.1 27.4 29.6* 28.4* 26.0 26.4  TCO2 21* 29 31 30 28 28   O2SAT 92.0 67.0 100.0 100.0 94.0 91.0    CBC Recent Labs  Lab 03/13/19 1525  03/14/19 0420  03/14/19 0557 03/15/19 0100 03/15/19 0612  HGB 7.1*   < > 8.3*   < > 9.2* 7.6* 8.5*  HCT 24.0*   < > 27.7*   < > 27.0* 25.9* 25.0*  WBC 38.4*  --  40.8*  --   --  38.6*  --   PLT 104*  --  97*  --   --  77*  --    < > = values in this interval not displayed.    COAGULATION No results for input(s): INR in the last 168 hours.  CARDIAC  No results for input(s): TROPONINI in the last 168 hours. No results for input(s): PROBNP in the last 168 hours.   CHEMISTRY Recent Labs  Lab 03/12/19 0530  03/13/19 0420 03/13/19 1525  03/14/19 0420 03/14/19 0503 03/14/19 0557 03/14/19 1710 03/15/19 0100 03/15/19 0612  NA 138  137   < > 136 138   < > 136 133* 131* 134* 133* 132*  K 4.9  4.9   < > 5.1 4.9   < > 5.3* 5.3* 5.2* 5.4* 4.9 4.6  CL 102  101   < > 101 100  --  99  --   --  97* 98  --   CO2 25  23   < > 26 24  --  26  --   --  25 26  --   GLUCOSE 221*  222*   < > 164* 149*  --  156*  --   --  187* 207*  --   BUN 94*  94*   < > 53* 53*  --  48*  --   --  43* 49*  --   CREATININE 2.83*  2.75*   < > 1.62* 1.48*  --  1.48*   --   --  1.32* 1.26*  --   CALCIUM 8.2*  8.1*   < > 8.3* 8.6*  --  8.7*  --   --  8.8* 8.9  --   MG 2.3  --  2.3  --   --  2.5*  --   --   --  2.5*  --   PHOS 5.1*   < > 4.1 3.7  --  4.6  --   --  4.4 3.1  --    < > = values in this interval not displayed.   Estimated Creatinine Clearance: 44.7 mL/min (A) (by C-G formula based on SCr of 1.26 mg/dL (H)).   LIVER Recent Labs  Lab 03/11/19 0500  03/13/19 0420 03/13/19 1525 03/14/19 0420 03/14/19 1710 03/15/19 0100  AST 32  --   --   --   --   --   --   ALT 48*  --   --   --   --   --   --   ALKPHOS 146*  --   --   --   --   --   --   BILITOT 0.3  --   --   --   --   --   --   PROT 6.0*  --   --   --   --   --   --   ALBUMIN 2.1*   < > 2.0* 2.1* 1.9* 1.9* 1.8*   < > = values in this interval not displayed.     INFECTIOUS Recent Labs  Lab 03/11/19 0500 03/13/19 0420 03/14/19 0420  LATICACIDVEN  --  1.8  --   PROCALCITON 2.65 23.09 21.69     ENDOCRINE CBG (last 3)  Recent Labs    03/15/19 0821 03/15/19 1248 03/15/19 1602  GLUCAP 162* 174* 162*         IMAGING x48h  - image(s) personally visualized  -   highlighted in bold Dg Chest Port 1 View  Result Date: 03/14/2019 CLINICAL DATA:  68 year old female with COVID-19, decreased breath sounds. EXAM: PORTABLE CHEST 1 VIEW COMPARISON:  03/13/2019. FINDINGS: Portable AP semi upright view at 0053 hours. ET tube tip just below the clavicles. Enteric feeding tube courses to the abdomen, tip not included. Stable right IJ central line and PICC line. Stable to mildly larger lung volumes. Normal cardiac size and mediastinal contours. Stable to mildly regressed patchy perihilar and peripheral bilateral pulmonary opacity, greater on the left. No pneumothorax, pulmonary edema, pleural effusion or areas of worsening ventilation. Paucity of bowel gas in the upper abdomen. No acute osseous abnormality identified. IMPRESSION: 1. Stable lines and tubes. 2. Stable to mildly improved  ventilation since yesterday. No new cardiopulmonary abnormality. Electronically Signed   By: Genevie Ann M.D.   On: 03/14/2019 01:14       Resolved Hospital Problem list   Angioedema due to remdesivir, requirement for intubation> resolved Mild stridor 7/9> resolved  Assessment & Plan:  Acute encephalopathy> persistent, no improvement despite several days of hemodialysis, trying to minimize sedation Insomnia/ICU delirium Neuromuscular weakness  March 15, 2019: No real change remains unresponsive  PLAN CT head  Repeat (last 03/10/2019) - dw daughter and she is keen on it   acute on chronic renal failure Continue CVVHD  COVID 19 pneumonia> some degree of lung injury noted on CXR/ABG S/p solumedrol 10 days, I don't think there is a role for using more Acute respiratory failure with hypoxemia due to inability to protect airway/COVID 19 pneumonia Remdesivir allergy Vent dyssynchrony/air hunger due to ARDS from COVID    03/15/2019 - > does not meet criteria for SBT/Extubation in setting of Acute Respiratory Failure due to ARDS covid and MODS  PLNA -Full mechanical ventilator support  Monitor SaO2, titrate PEEP and FiO2 per standard protocol Minimize sedation Continue  scheduled DuoNeb Continue sedation for vent synchrony> delicate balance using this as we are also trying to minimize sedation to assess neuro function.  However, multiple times this week she has had severe air trapping related to underlying lung disease. duoneb continued Adjust sedation to RASS -2 Add propofol  Fever 7/4> due to COVID Elevated WBC Shock:  Sepsis, unclear source; could this be cardiogenic shock? Myocarditis? PICC Monitor blood culture Continue cefepime Echo pending from 03/14/2019 Levophed titrated to MAP > 65  Dr Lake Bells d a lengthy conversation with the patient's daughter today 03/14/2019.  I expressed my concern that her overall condition is poor and she is not doing very well with multiorgan  failure.  I explained that any sort of recovery would take weeks to months.  I talked about CODE STATUS including whether or not it would be appropriate to perform CPR.  I explained that this would be unlikely to provide medical benefit and would cause harm.  At this point the patient's daughter said she wants everything done for her mother including CPR.  Is not clear that she has an understanding of the overall severity and poor prognosis despite me attempting to describe this over the last few days.  We will ask palliative medicine to call her to help discuss goals of care.  Overall I do not think that she has a good chance of surviving this hospitalization.   Best practice:  Diet: continue tube feeding Pain/Anxiety/Delirium protocol (if indicated): RASS -2, continue fentanyl and propofol VAP protocol (if indicated): yes DVT prophylaxis: sub q heparin GI prophylaxis: famotidine Glucose control: SSI Mobility: bed rest Code Status: full Family Communication: Delma Officer - updated  Disposition: remain in [place]   The patient Aprill Banko is critically ill with multiple organ systems failure and requires high complexity decision making for assessment and support, frequent evaluation and titration of therapies, application of advanced monitoring technologies and extensive interpretation of multiple databases.   Critical Care Time devoted to patient care services described in this note is  30  Minutes. This time reflects time of care of this signee Dr Brand Males. This critical care time does not reflect procedure time, or teaching time or supervisory time of PA/NP/Med student/Med Resident etc but could involve care discussion time     Dr. Brand Males, M.D., [facility].C.P Pulmonary and Critical Care Medicine Staff Physician [facility] Pager: [phone], If no answer or between  15:00h - 7:00h: call [phone]   [phone]  03/15/2019 5:06 PM

## 2019-03-15 NOTE — Progress Notes (Signed)
[facility] Progress Note  Subjective: on vent, 260 positive with CRRT yesterday - pressors being weaned but MS has not improved. - no mechanical issues with machine. NO UOP.  Getting EEG and head CT for unresponsiveness    Vitals:   03/15/19 1024 03/15/19 1100 03/15/19 1159 03/15/19 1200  BP:  (!) 102/50  (!) 98/51  Pulse: 78 75  73  Resp: (!) 27 (!) 26  (!) 30  Temp: 98 F (36.7 C)     TempSrc: Oral     SpO2: 96% 96% 95% 94%  Weight:      Height:        Inpatient medications: . sodium chloride   Intravenous Once  . chlorhexidine gluconate (MEDLINE KIT)  15 mL Mouth Rinse BID  . Chlorhexidine Gluconate Cloth  6 each Topical Q0600  . cholecalciferol  1,000 Units Per Tube Daily  . [START ON 03/19/2019] darbepoetin (ARANESP) injection - NON-DIALYSIS  150 mcg Subcutaneous Q Sun-1800  . feeding supplement (PRO-STAT SUGAR FREE 64)  60 mL Per Tube TID  . free water  300 mL Per Tube Q4H  . heparin injection (subcutaneous)  7,500 Units Subcutaneous Q8H  . insulin aspart  0-20 Units Subcutaneous Q4H  . insulin glargine  40 Units Subcutaneous BID  . ipratropium-albuterol  3 mL Nebulization Q6H  . mouth rinse  15 mL Mouth Rinse 10 times per day  . pantoprazole sodium  40 mg Per Tube Daily  . polyethylene glycol  17 g Per Tube BID  . senna  2 tablet Per Tube QHS  . sodium chloride flush  10-40 mL Intracatheter Q12H   .  prismasol BGK 4/2.5 400 mL/hr at 03/15/19 0340  .  prismasol BGK 4/2.5 200 mL/hr at 03/15/19 0413  . sodium chloride 5 mL/hr at 03/15/19 1200  . ceFEPime (MAXIPIME) IV Stopped (03/15/19 1025)  . feeding supplement (VITAL HIGH PROTEIN) 45 mL/hr at 03/15/19 0546  . fentaNYL infusion INTRAVENOUS Stopped (03/15/19 1012)  . heparin 10,000 units/ 20 mL infusion syringe 500 Units/hr (03/15/19 0700)  . norepinephrine (LEVOPHED) Adult infusion 8 mcg/min (03/15/19 1150)  . prismasol BGK 4/2.5 1,800 mL/hr at 03/15/19 1309  . propofol (DIPRIVAN) infusion 15.664  mcg/kg/min (03/15/19 1200)  . vancomycin 1,000 mg (03/15/19 1305)   sodium chloride, acetaminophen **OR** acetaminophen, albuterol, alteplase, bisacodyl, dextrose, EPINEPHrine, fentaNYL (SUBLIMAZE) injection, fentaNYL (SUBLIMAZE) injection, guaiFENesin-dextromethorphan, heparin, heparin, hydrALAZINE, lip balm, midazolam, ondansetron **OR** ondansetron (ZOFRAN) IV, sodium chloride, sodium chloride flush, sodium phosphate    Exam: Due to COVID restrictions and isolation- pt was not seen by in an effort to preserve PPE and to minimize exposure to providers and other patients.  Temp HD cath- right IJ placed 7/11   Home meds of note:  - lisinopril 20 qd/ hydrochlorothiazide 25 qd   - duloxetine 30 qd/ gabapentin 300 tid/ vicodin qid prn  - naproxen 440 bid prn/    UNa 76, UCr 58  (02/27/19)   Date                            Creat               eGFR  2009- 11/2016              1.3- 1.8            32- 50  CKD III  05/2017  2.61                 21  CKD IV  02/14/2019- 03/11/19         7.09 >> 4.14    Renal US >  8.8/ 10 cm kidneys, ^echogenicity c/w CKD, no hydro  CXR 7/11 > Patchy bilateral opacities, left greater than right, worrisome for an infectious process  total I/O 6/29 - 7/10 = 28L in and 27.8 L out, +365  admit wt = 77kg, peak 82kg- now 79.8    FeSat 4% on 6/29, ferr 244   Assessment/ Plan: 1. AKI on CKDIV - baseline creat around 2.5 from 2018 which was CKD IV eGFR 72m/min.  Echogenic kidneys on [place] go along w/ advanced renal failure chronically.  Creat 7 on admission due to dehydration, improved to 2.9 then rose to 4.0 and started CRRT on 7/11.  Plan cont CRRT for now, heparin 500u/hr flat rate- now on hold due to low platelets. Numbers have improved on CRRT  2. Volume -  CVP - lower  CXR's not helpful w/ bilat infiltrates.  no sig edema on exam. On low dose pressors-  keeping even to slightly negative on machine.  3. DM2 on insulin 4. COVID-19 PNA / vent dep  resp failure: per primary- WBC very high but improving   5. Anemia ckd + blood draws+ other: possibly CKD related in part, Hb 9 on admit , now 7's.  Low Fe sat 6/29. Will start esa - inc dose and IV Fe -(pharmacy told me we cannot give ) . Transfuse prn- given one unit on 7/11 and 7/13.  694 Dec MS- cannot blame on uremia with better numbers - work up in progress  7. Hyponatremia- dec free water    [facility] 03/15/2019, 1:19 PM  Iron/TIBC/Ferritin/ %Sat    Component Value Date/Time   IRON 14 (L) 02/27/2019 0215   TIBC 350 02/27/2019 0215   FERRITIN 154 03/08/2019 0400   IRONPCTSAT 4 (L) 02/27/2019 0215   Recent Labs  Lab 03/15/19 0100 03/15/19 0612  NA 133* 132*  K 4.9 4.6  CL 98  --   CO2 26  --   GLUCOSE 207*  --   BUN 49*  --   CREATININE 1.26*  --   CALCIUM 8.9  --   PHOS 3.1  --   ALBUMIN 1.8*  --    Recent Labs  Lab 03/11/19 0500  AST 32  ALT 48*  ALKPHOS 146*  BILITOT 0.3  PROT 6.0*   Recent Labs  Lab 03/15/19 0100 03/15/19 0612  WBC 38.6*  --   HGB 7.6* 8.5*  HCT 25.9* 25.0*  PLT 77*  --

## 2019-03-15 NOTE — Progress Notes (Signed)
PROGRESS NOTE  Nichole Franklin ERX:[id number] DOB: 10/16/1950 DOA: 02/13/2019  PCP: Elwyn Reach, MD  Brief History/Interval Summary: Patient is a 68 y.o. female with PMHx of DM-2, HTN, dyslipidemia, CKD stage IV who presented to the hospital initially for evaluation of weakness, she was found to have fever and acute kidney injury.  She was subsequently admitted to [facility] her renal function stabilized-she was transferred to [facility] course since admission has been complicated by encephalopathy, angioedema following initiation of Remdesivir-and respiratory failure requiring intubation on 7/4.   Subjective/Interval History: Patient remains intubated and sedated.  No significant events overnight as discussed with staff  Assessment/Plan:  Acute Hypoxic Resp. Failure due to Acute Covid 19 Viral Illness/sepsis  Vent Mode: PRVC FiO2 (%):  [40 %] 40 % Set Rate:  [24 bmp] 24 bmp Vt Set:  [410 mL-510 mL] 410 mL PEEP:  [5 cmH20] 5 cmH20 Plateau Pressure:  [20 cmH20-27 cmH20] 26 cmH20     Component Value Date/Time   PHART 7.333 (L) 03/15/2019 0612   PCO2ART 49.6 (H) 03/15/2019 0612   PO2ART 66.0 (L) 03/15/2019 0612   HCO3 26.4 03/15/2019 0612   TCO2 28 03/15/2019 0612   ACIDBASEDEF 2.0 03/14/2019 0557   O2SAT 91.0 03/15/2019 0612    COVID-19 Labs  No results for input(s): DDIMER, FERRITIN, LDH, CRP in the last 72 hours.  Lab Results  Component Value Date   SARSCOV2NAA POSITIVE (A) 01/30/2019     Fever: Hypothermia appears to have resolved.  T-max of 101.5 F on 7/11 Oxygen requirements: On mechanical ventilation.  40% FiO2.  Saturating in the 90s.     Antibiotics: Cefepime, day 11.  Vancomycin was added 7/12.   Remdesivir: Patient developed angioedema to Remdesivir Steroids: She has been taken off of steroids due to encephalopathy. Diuretics: She has received furosemide during this hospitalization. Last dose was on 7/9 Actemra: Did  not receive Convalescent Plasma: Did not receive Vitamin C and Zinc: Continue DVT Prophylaxis: On heparin 7500 units subcutaneously every 8 hours  Patient was initially intubated due to angioedema thought to be secondary to Remdesivir.  Angioedema resolved.  Patient was extubated on 7/8.  She did have some stridor at the same evening which was treated with nebulizer treatments and steroids.  This has improved.  She had significant altered mentation, which continued to be significantly encephalopathic, she was intubated 7/10 for airway protection .  Patient remains on mechanical ventilation.  Pulmonology is following.  Patient developed a fever couple of days ago.  Concern was for sepsis.  As well she is having significantly elevated white blood cell count, and procalcitonin, continue with empiric antibiotic coverage with vancomycin, cefepime, repeat blood cultures.  Blood cultures done on 7/11 remains negative.  Acute kidney injury on chronic kidney disease stage IV Baseline creatinine around 1.6-2.  She initially presented with a BUN of 81 and a creatinine of 7.09.  Renal function had improved.  But then it started getting worse again a few days ago.  She had a decrease in her urine output.  Nephrology was consulted.  Dialysis catheter was placed.  Patient was started on CRRT.  Nephrology continues to follow.    Thrombocytopenia Drop in platelet counts noted.  Continue to monitor  Acute metabolic encephalopathy She had encephalopathy earlier during the course of the hospitalization thought to be due to hypoxemia and other acute metabolic derangement.  CT scan done on 7/4 did not show any acute findings.  After  extubation her mentation has been progressively getting worse.  She had a CT scan repeated on 7/10 which did not show any acute findings.  She had to be reintubated as she was not able to protect her airway.  Mentation could also be altered due to uremia.  Steroids were discontinued.  Her BUN  has improved with CRRT.  No change in mental status and actually some concern for worsening overnight.  She has a left-sided gaze now.  CT scan to be repeated.  EEG.  Discussed briefly with neurology as well.  They will consult depending on findings on CT scan.    Septic shock Continues to require Levophed.  Procalcitonin noted to be elevated.  Normal lactic acid level noted.  Continue vancomycin and cefepime.  WBC remains elevated without significant change.  Slight downward trend , repeat blood cultures, will change central lines continues to have worsening leukocytosis.  Elevated d-dimer Patient was empirically started on IV heparin on 7/4 due to concern for VTE.  Due to drop in hemoglobin this was discontinued.  Lower extremity Doppler study was negative for DVT.  Clinically there was low suspicion for VTE.  D-dimer continues to trend down.    Angioedema Most likely secondary to Remdesivir.  Tongue swelling has improved.  Hypernatremia Resolved with free water.  Monitor electrolytes closely.  Diabetes mellitus type 2 with uncontrolled hyperglycemia HbA1c 9.3.  Patient is on Lantus insulin and SSI.  CBGs are reasonably well controlled.  Initially was on IV insulin.    Normocytic anemia  Patient has required transfusion of 3 PRBCs (7/6, 7/11, 7/13).  No evidence for overt bleeding noted.  Hemoglobin has responded appropriately to blood transfusion.  Nutrition Being fed through a feeding tube.    DVT Prophylaxis: Subcutaneous Heparin  PUD Prophylaxis: Protonix Code Status: Full code Family Communication: Pulmonology has been discussing with patient's family. Disposition Plan: Remain in ICU for now Anti-infectives (From admission, onward)   Start     Dose/Rate Route Frequency Ordered Stop   03/13/19 1400  vancomycin (VANCOCIN) IVPB 1000 mg/200 mL premix     1,000 mg 200 mL/hr over 60 Minutes Intravenous Every 24 hours 03/12/19 1151     03/12/19 1100  vancomycin (VANCOCIN) 1,750 mg  in sodium chloride 0.9 % 500 mL IVPB     1,750 mg 250 mL/hr over 120 Minutes Intravenous  Once 03/12/19 1056 03/12/19 1500   03/11/19 2230  ceFEPIme (MAXIPIME) 2 g in sodium chloride 0.9 % 100 mL IVPB     2 g 200 mL/hr over 30 Minutes Intravenous Every 12 hours 03/11/19 2150     03/07/19 1000  ceFEPIme (MAXIPIME) 2 g in sodium chloride 0.9 % 100 mL IVPB  Status:  Discontinued     2 g 200 mL/hr over 30 Minutes Intravenous Every 24 hours 03/06/19 1434 03/11/19 2150   03/04/19 2200  ceFEPIme (MAXIPIME) 1 g in sodium chloride 0.9 % 100 mL IVPB  Status:  Discontinued     1 g 200 mL/hr over 30 Minutes Intravenous Every 12 hours 03/04/19 1256 03/06/19 1434   03/04/19 1400  remdesivir 100 mg in sodium chloride 0.9 % 250 mL IVPB  Status:  Discontinued     100 mg 500 mL/hr over 30 Minutes Intravenous Every 24 hours 03/03/19 1305 03/04/19 0936   03/04/19 1200  linezolid (ZYVOX) IVPB 600 mg  Status:  Discontinued     600 mg 300 mL/hr over 60 Minutes Intravenous Every 12 hours 03/04/19 1141 03/05/19 1100  03/04/19 0900  linezolid (ZYVOX) IVPB 600 mg  Status:  Discontinued     600 mg 300 mL/hr over 60 Minutes Intravenous Every 12 hours 03/04/19 0759 03/04/19 1141   03/04/19 0800  ceFEPIme (MAXIPIME) 2 g in sodium chloride 0.9 % 100 mL IVPB  Status:  Discontinued     2 g 200 mL/hr over 30 Minutes Intravenous Every 12 hours 03/04/19 0706 03/04/19 1256   03/03/19 1400  remdesivir 200 mg in sodium chloride 0.9 % 250 mL IVPB     200 mg 500 mL/hr over 30 Minutes Intravenous Once 03/03/19 1305 03/03/19 1500   02/27/19 2000  azithromycin (ZITHROMAX) 500 mg in sodium chloride 0.9 % 250 mL IVPB  Status:  Discontinued     500 mg 250 mL/hr over 60 Minutes Intravenous Every 24 hours 02/27/19 0428 03/04/19 1211   02/27/19 0430  cefTRIAXone (ROCEPHIN) 1 g in sodium chloride 0.9 % 100 mL IVPB  Status:  Discontinued     1 g 200 mL/hr over 30 Minutes Intravenous Every 24 hours 02/27/19 0428 03/04/19 0706       Medications:  Scheduled: . sodium chloride   Intravenous Once  . chlorhexidine gluconate (MEDLINE KIT)  15 mL Mouth Rinse BID  . Chlorhexidine Gluconate Cloth  6 each Topical Q0600  . cholecalciferol  1,000 Units Per Tube Daily  . [START ON 03/19/2019] darbepoetin (ARANESP) injection - NON-DIALYSIS  150 mcg Subcutaneous Q Sun-1800  . feeding supplement (PRO-STAT SUGAR FREE 64)  60 mL Per Tube TID  . free water  300 mL Per Tube Q8H  . heparin injection (subcutaneous)  7,500 Units Subcutaneous Q8H  . insulin aspart  0-20 Units Subcutaneous Q4H  . insulin glargine  40 Units Subcutaneous BID  . ipratropium-albuterol  3 mL Nebulization Q6H  . mouth rinse  15 mL Mouth Rinse 10 times per day  . pantoprazole sodium  40 mg Per Tube Daily  . polyethylene glycol  17 g Per Tube BID  . senna  2 tablet Per Tube QHS  . sodium chloride flush  10-40 mL Intracatheter Q12H   Continuous: .  prismasol BGK 4/2.5 400 mL/hr at 03/15/19 0340  .  prismasol BGK 4/2.5 200 mL/hr at 03/15/19 0413  . sodium chloride 5 mL/hr at 03/15/19 1200  . ceFEPime (MAXIPIME) IV Stopped (03/15/19 1025)  . feeding supplement (VITAL HIGH PROTEIN) 45 mL/hr at 03/15/19 0546  . fentaNYL infusion INTRAVENOUS 50 mcg/hr (03/15/19 1409)  . heparin 10,000 units/ 20 mL infusion syringe 500 Units/hr (03/15/19 0700)  . norepinephrine (LEVOPHED) Adult infusion 8 mcg/min (03/15/19 1150)  . prismasol BGK 4/2.5 1,800 mL/hr at 03/15/19 1309  . propofol (DIPRIVAN) infusion 15.664 mcg/kg/min (03/15/19 1200)  . vancomycin 1,000 mg (03/15/19 1305)   JGO:TLXBWI chloride, acetaminophen **OR** acetaminophen, albuterol, alteplase, bisacodyl, dextrose, EPINEPHrine, fentaNYL (SUBLIMAZE) injection, fentaNYL (SUBLIMAZE) injection, guaiFENesin-dextromethorphan, heparin, heparin, hydrALAZINE, lip balm, midazolam, ondansetron **OR** ondansetron (ZOFRAN) IV, sodium chloride, sodium chloride flush, sodium phosphate   Objective:  Vital Signs  Vitals:    03/15/19 1159 03/15/19 1200 03/15/19 1300 03/15/19 1400  BP:  (!) 98/51 (!) 108/46 (!) 107/37  Pulse:  73 73 63  Resp:  (!) 30 19 (!) 31  Temp:      TempSrc:      SpO2: 95% 94% 96% 95%  Weight:      Height:        Intake/Output Summary (Last 24 hours) at 03/15/2019 1444 Last data filed at 03/15/2019 1400 Gross per 24 hour  Intake 2602.02 ml  Output 1924 ml  Net 678.02 ml   Filed Weights   03/13/19 0500 03/14/19 0500 03/15/19 0434  Weight: 80.4 kg 79.8 kg 76.7 kg    Sedated, intubated, in no apparent distress Symmetrical Chest wall movement, Good air movement bilaterally, CTAB RRR,No Gallops,Rubs or new Murmurs, No Parasternal Heave +ve B.Sounds, Abd Soft, No tenderness, No rebound - guarding or rigidity. No Cyanosis, Clubbing or edema, No new Rash or bruise       Lab Results:  Data Reviewed: I have personally reviewed following labs and imaging studies  CBC: Recent Labs  Lab 03/11/19 0500  03/12/19 0530 03/13/19 0420 03/13/19 1525  03/14/19 0420 03/14/19 0503 03/14/19 0557 03/15/19 0100 03/15/19 0612  WBC 39.0*   < > 46.9* 43.9* 38.4*  --  40.8*  --   --  38.6*  --   NEUTROABS 32.9*  --  39.9* 37.2*  --   --  35.1*  --   --  33.8*  --   HGB 6.8*   < > 8.3* 7.3* 7.1*   < > 8.3* 9.2* 9.2* 7.6* 8.5*  HCT 22.2*   < > 26.8* 24.8* 24.0*   < > 27.7* 27.0* 27.0* 25.9* 25.0*  MCV 92.5   < > 93.7 94.7 94.5  --  96.9  --   --  97.0  --   PLT 156   < > 136* 116* 104*  --  97*  --   --  77*  --    < > = values in this interval not displayed.    Basic Metabolic Panel: Recent Labs  Lab 03/12/19 0530  03/13/19 0420 03/13/19 1525  03/14/19 0420 03/14/19 0503 03/14/19 0557 03/14/19 1710 03/15/19 0100 03/15/19 0612  NA 138  137   < > 136 138   < > 136 133* 131* 134* 133* 132*  K 4.9  4.9   < > 5.1 4.9   < > 5.3* 5.3* 5.2* 5.4* 4.9 4.6  CL 102  101   < > 101 100  --  99  --   --  97* 98  --   CO2 25  23   < > 26 24  --  26  --   --  25 26  --   GLUCOSE 221*   222*   < > 164* 149*  --  156*  --   --  187* 207*  --   BUN 94*  94*   < > 53* 53*  --  48*  --   --  43* 49*  --   CREATININE 2.83*  2.75*   < > 1.62* 1.48*  --  1.48*  --   --  1.32* 1.26*  --   CALCIUM 8.2*  8.1*   < > 8.3* 8.6*  --  8.7*  --   --  8.8* 8.9  --   MG 2.3  --  2.3  --   --  2.5*  --   --   --  2.5*  --   PHOS 5.1*   < > 4.1 3.7  --  4.6  --   --  4.4 3.1  --    < > = values in this interval not displayed.    GFR: Estimated Creatinine Clearance: 44.7 mL/min (A) (by C-G formula based on SCr of 1.26 mg/dL (H)).  Liver Function Tests: Recent Labs  Lab 03/11/19 0500  03/13/19 0420 03/13/19 1525 03/14/19 0420 03/14/19  1710 03/15/19 0100  AST 32  --   --   --   --   --   --   ALT 48*  --   --   --   --   --   --   ALKPHOS 146*  --   --   --   --   --   --   BILITOT 0.3  --   --   --   --   --   --   PROT 6.0*  --   --   --   --   --   --   ALBUMIN 2.1*   < > 2.0* 2.1* 1.9* 1.9* 1.8*   < > = values in this interval not displayed.     CBG: Recent Labs  Lab 03/14/19 1939 03/14/19 2311 03/15/19 0318 03/15/19 0821 03/15/19 1248  GLUCAP 199* 185* 189* 162* 174*      Recent Results (from the past 240 hour(s))  Culture, blood (routine x 2)     Status: None (Preliminary result)   Collection Time: 03/11/19 10:18 PM   Specimen: BLOOD  Result Value Ref Range Status   Specimen Description BLOOD RIGHT ANTECUBITAL  Final   Special Requests   Final    BOTTLES DRAWN AEROBIC AND ANAEROBIC Blood Culture adequate volume   Culture   Final    NO GROWTH 3 DAYS Performed at [facility], [place] [address] [place], [place] [address]    Report Status PENDING  Incomplete  Culture, blood (routine x 2)     Status: None (Preliminary result)   Collection Time: 03/11/19 10:23 PM   Specimen: BLOOD  Result Value Ref Range Status   Specimen Description BLOOD RIGHT ANTECUBITAL  Final   Special Requests   Final    BOTTLES DRAWN AEROBIC AND ANAEROBIC Blood Culture adequate  volume   Culture   Final    NO GROWTH 3 DAYS Performed at [facility], [place] [address], [place], [place] [address]    Report Status PENDING  Incomplete      Radiology Studies: Dg Chest Port 1 View  Result Date: 03/14/2019 CLINICAL DATA:  68 year old female with COVID-19, decreased breath sounds. EXAM: PORTABLE CHEST 1 VIEW COMPARISON:  03/13/2019. FINDINGS: Portable AP semi upright view at 0053 hours. ET tube tip just below the clavicles. Enteric feeding tube courses to the abdomen, tip not included. Stable right IJ central line and PICC line. Stable to mildly larger lung volumes. Normal cardiac size and mediastinal contours. Stable to mildly regressed patchy perihilar and peripheral bilateral pulmonary opacity, greater on the left. No pneumothorax, pulmonary edema, pleural effusion or areas of worsening ventilation. Paucity of bowel gas in the upper abdomen. No acute osseous abnormality identified. IMPRESSION: 1. Stable lines and tubes. 2. Stable to mildly improved ventilation since yesterday. No new cardiopulmonary abnormality. Electronically Signed   By: Genevie Ann M.D.   On: 03/14/2019 01:14       LOS: 70 days   Phillips Climes MD  [facility] Pager on [email or web address]  03/15/2019, 2:44 PM

## 2019-03-15 NOTE — Consult Note (Signed)
Palliative Care Consult Note  Reason for consult: Goals of care in light of COVID 19 infection with multisystem organ failure including respiratory and renal failure  Palliative medicine consult received.  Chart reviewed including personal review of pertinent labs and imaging.  Briefly, Ms. Fulop is a 68 year old female with PMHx T2DM, HTN, HLP, CKD IV who was admitted with fever and AKI and found to be COVID positive.  She was transferred to [facility] and her course has been complicated by encephalopathy, angioedema, respiratory failure requiring intubation x 2, and renal failure requiring CRRT.  She has been noted to have worsened mental status, leftward gaze, and posturing.  Palliative consulted for goals of care in light of multisystem organ failure with poor prognosis.  I called and was able to reach patient's daughter, Delma Officer.  I introduced palliative care as specialized medical care for people living with serious illness. It focuses on providing relief from the symptoms and stress of a serious illness. The goal is to improve quality of life for both the patient and the family and ensure that the things that are most important to her mother are the focus of her care moving forward.[name] reports that her mother's care team has been doing a good job updating her (notes several extensive conversations with Dr. Lake Bells) and she understands her mother's situation.  I asked her to share with me regarding the things that are most important to her mother and we spoke of her family (1 daughter and 2 grandchildren), her friends, and her faith.  She then stated that she is finding our conversation upsetting, and unless I have an acute update on her mother's condition she would prefer not to discuss with me anymore.  I let her know that my goal was not to upset her, but rather to support her family.  She then stated that she was told by another member of care team "that we are  reaching a point where we are going to have to say goodbye."  I attempted to ask follow-up regarding what she thinks her mother would want for care at this point, but she cut me off and said that she would like to visit her mother, but she is not ready to discuss changing care plan.  I attempted to offer supportive listening and let her know that I cannot imagine how difficult it is to not be able to see her mother, but with plan for continued aggressive care she is not likely to qualify for visitation waiver that is usually granted for patient's who are imminently dying.  I let her know that I would reach out to discuss further with primary attending.  At this point, [name] stated she did not desire further follow-up from our team unless we are able to facilitate visitation with her mother.  She has my contact information from message I left earlier today and I asked her to call if we can be of further assistance or support.  Total time: 55 minutes Greater than 50%  of this time was spent counseling and coordinating care related to the above assessment and plan.  The above conversation was completed via telephone due to the visitor restrictions during the COVID-19 pandemic. Thorough chart review and discussion with necessary members of the care team was completed as part of assessment. All issues were discussed and addressed but no physical exam was performed.  Micheline Rough, MD [facility] [phone]

## 2019-03-15 NOTE — Progress Notes (Signed)
Pharmacy Antibiotic Note  Nichole Franklin is a 68 y.o. female admitted on 02/15/2019 with sepsis.  Pharmacy has been consulted to start Vancomycin dosing. Currently on Cefepime with worsening leukocytosis and fever spike overnight. Patient was started on CRRT on 7/10.   Plan: -Continue Vancomycin 1 gm IV Q 24 hours with CRRT  -Continue Cefepime 2 gm IV Q 12 hours -Monitor CBC, cultures and clinical progress -Vanc levels as needed   Height: 5\' 6"  (167.6 cm) Weight: 169 lb (76.7 kg) IBW/kg (Calculated) : 59.3  Temp (24hrs), Avg:98.2 F (36.8 C), Min:97.9 F (36.6 C), Max:98.6 F (37 C)  Recent Labs  Lab 03/12/19 0530  03/13/19 0420 03/13/19 1525 03/14/19 0420 03/14/19 1710 03/15/19 0100  WBC 46.9*  --  43.9* 38.4* 40.8*  --  38.6*  CREATININE 2.83*  2.75*   < > 1.62* 1.48* 1.48* 1.32* 1.26*  LATICACIDVEN  --   --  1.8  --   --   --   --    < > = values in this interval not displayed.    Estimated Creatinine Clearance: 44.7 mL/min (A) (by C-G formula based on SCr of 1.26 mg/dL (H)).    Allergies  Allergen Reactions  . Remdesivir Anaphylaxis    Antimicrobials this admission: 6/29 Azith >> 7/4 (QTc >500) 6/29 CXT >>7/4 7/4 Cefepime >>  7/4 Linezolid >> 7/5 7/12 Vanc>>    Microbiology results: 6/28 COVID + 6/28 BCx:  negF 7/4 MRSA PCR: neg 7/4 TA:  abundant GPC (moderate normal flora) 7/11 BCx2:  ngtd  Thank you for allowing pharmacy to be a part of this patient's care.  Gretta Arab PharmD, BCPS Clinical pharmacist phone 7am- 5pm: [phone] 03/15/2019 3:46 PM

## 2019-03-16 ENCOUNTER — Inpatient Hospital Stay (HOSPITAL_COMMUNITY): Payer: Medicare HMO

## 2019-03-16 LAB — RENAL FUNCTION PANEL
Albumin: 1.8 g/dL — ABNORMAL LOW (ref 3.5–5.0)
Albumin: 1.6 g/dL — ABNORMAL LOW (ref 3.5–5.0)
Anion gap: 10 (ref 5–15)
Anion gap: 10 (ref 5–15)
BUN: 44 mg/dL — ABNORMAL HIGH (ref 8–23)
BUN: 34 mg/dL — ABNORMAL HIGH (ref 8–23)
CO2: 25 mmol/L (ref 22–32)
CO2: 25 mmol/L (ref 22–32)
Calcium: 8.8 mg/dL — ABNORMAL LOW (ref 8.9–10.3)
Calcium: 8.9 mg/dL (ref 8.9–10.3)
Chloride: 99 mmol/L (ref 98–111)
Chloride: 97 mmol/L — ABNORMAL LOW (ref 98–111)
Creatinine, Ser: 1.02 mg/dL — ABNORMAL HIGH (ref 0.44–1.00)
Creatinine, Ser: 0.94 mg/dL (ref 0.44–1.00)
GFR calc Af Amer: >60 mL/min (ref >60)
GFR calc Af Amer: >60 mL/min (ref >60)
GFR calc non Af Amer: 56 mL/min — ABNORMAL LOW (ref >60)
GFR calc non Af Amer: >60 mL/min (ref >60)
Glucose, Bld: 110 mg/dL — ABNORMAL HIGH (ref 70–99)
Glucose, Bld: 174 mg/dL — ABNORMAL HIGH (ref 70–99)
Phosphorus: 3.2 mg/dL (ref 2.5–4.6)
Phosphorus: 3 mg/dL (ref 2.5–4.6)
Potassium: 4.7 mmol/L (ref 3.5–5.1)
Potassium: 5 mmol/L (ref 3.5–5.1)
Sodium: 134 mmol/L — ABNORMAL LOW (ref 135–145)
Sodium: 132 mmol/L — ABNORMAL LOW (ref 135–145)

## 2019-03-16 LAB — CBC WITH DIFFERENTIAL/PLATELET
Abs Immature Granulocytes: 2.33 10*3/uL — ABNORMAL HIGH (ref 0.00–0.07)
Basophils Absolute: 0.1 10*3/uL (ref 0.0–0.1)
Basophils Relative: 0 %
Eosinophils Absolute: 0.1 10*3/uL (ref 0.0–0.5)
Eosinophils Relative: 0 %
HCT: 24.7 % — ABNORMAL LOW (ref 36.0–46.0)
Hemoglobin: 7.2 g/dL — ABNORMAL LOW (ref 12.0–15.0)
Immature Granulocytes: 7 %
Lymphocytes Relative: 3 %
Lymphs Abs: 1.2 10*3/uL (ref 0.7–4.0)
MCH: 28.7 pg (ref 26.0–34.0)
MCHC: 29.1 g/dL — ABNORMAL LOW (ref 30.0–36.0)
MCV: 98.4 fL (ref 80.0–100.0)
Monocytes Absolute: 1.7 10*3/uL — ABNORMAL HIGH (ref 0.1–1.0)
Monocytes Relative: 5 %
Neutro Abs: 30.2 10*3/uL — ABNORMAL HIGH (ref 1.7–7.7)
Neutrophils Relative %: 85 %
Platelets: 51 10*3/uL — ABNORMAL LOW (ref 150–400)
RBC: 2.51 MIL/uL — ABNORMAL LOW (ref 3.87–5.11)
RDW: 15.9 % — ABNORMAL HIGH (ref 11.5–15.5)
WBC: 35.5 10*3/uL — ABNORMAL HIGH (ref 4.0–10.5)
nRBC: 2.2 % — ABNORMAL HIGH (ref 0.0–0.2)

## 2019-03-16 LAB — CBC
HCT: 24.6 % — ABNORMAL LOW (ref 36.0–46.0)
Hemoglobin: 7.2 g/dL — ABNORMAL LOW (ref 12.0–15.0)
MCH: 28.8 pg (ref 26.0–34.0)
MCHC: 29.3 g/dL — ABNORMAL LOW (ref 30.0–36.0)
MCV: 98.4 fL (ref 80.0–100.0)
Platelets: 47 10*3/uL — ABNORMAL LOW (ref 150–400)
RBC: 2.5 MIL/uL — ABNORMAL LOW (ref 3.87–5.11)
RDW: 16.2 % — ABNORMAL HIGH (ref 11.5–15.5)
WBC: 38.3 10*3/uL — ABNORMAL HIGH (ref 4.0–10.5)
nRBC: 2.5 % — ABNORMAL HIGH (ref 0.0–0.2)

## 2019-03-16 LAB — GLUCOSE, CAPILLARY
Glucose-Capillary: 106 mg/dL — ABNORMAL HIGH (ref 70–99)
Glucose-Capillary: 129 mg/dL — ABNORMAL HIGH (ref 70–99)
Glucose-Capillary: 149 mg/dL — ABNORMAL HIGH (ref 70–99)
Glucose-Capillary: 153 mg/dL — ABNORMAL HIGH (ref 70–99)
Glucose-Capillary: 162 mg/dL — ABNORMAL HIGH (ref 70–99)
Glucose-Capillary: 61 mg/dL — ABNORMAL LOW (ref 70–99)

## 2019-03-16 LAB — APTT: aPTT: 42 seconds — ABNORMAL HIGH (ref 24–36)

## 2019-03-16 LAB — MAGNESIUM: Magnesium: 2.3 mg/dL (ref 1.7–2.4)

## 2019-03-16 LAB — PREPARE RBC (CROSSMATCH)

## 2019-03-16 LAB — PROCALCITONIN: Procalcitonin: 34.6 ng/mL

## 2019-03-16 MED ORDER — DEXTROSE 50 % IV SOLN
INTRAVENOUS | Status: AC
  Administered 2019-03-16: 25 mL via INTRAVENOUS
  Filled 2019-03-16: qty 50

## 2019-03-16 MED ORDER — HEPARIN SODIUM (PORCINE) 5000 UNIT/ML IJ SOLN
5000.0000 [IU] | Freq: Three times a day (TID) | INTRAMUSCULAR | Status: DC
  Administered 2019-03-16 – 2019-03-17 (×3): 5000 [IU] via SUBCUTANEOUS
  Filled 2019-03-16 (×3): qty 1

## 2019-03-16 MED ORDER — SODIUM CHLORIDE 0.9% IV SOLUTION: Freq: Once | INTRAVENOUS | Status: DC

## 2019-03-16 MED ORDER — INSULIN GLARGINE 100 UNIT/ML ~~LOC~~ SOLN
20.0000 [IU] | Freq: Two times a day (BID) | SUBCUTANEOUS | Status: DC
  Administered 2019-03-16 – 2019-03-17 (×2): 20 [IU] via SUBCUTANEOUS
  Filled 2019-03-16 (×4): qty 0.2

## 2019-03-16 NOTE — Progress Notes (Signed)
[facility] Progress Note  Subjective:   No meaningful change in status-  Ended up positive on CRRT 700 or so- still unresponsive on wake up assessments.  CRRT running pretty well - still on heparin but plts down again HIT drawn  Vitals:   03/16/19 0700 03/16/19 0727 03/16/19 0800 03/16/19 0900  BP: (!) 100/45 (!) 125/46 (!) 102/43 (!) 105/41  Pulse: 66 64 61 (!) 59  Resp: (!) 24 (!) 24 (!) 24 (!) 24  Temp:   98 F (36.7 C)   TempSrc:   Oral   SpO2: 99% 98% 97% 97%  Weight:      Height:        Inpatient medications: . sodium chloride   Intravenous Once  . sodium chloride   Intravenous Once  . chlorhexidine gluconate (MEDLINE KIT)  15 mL Mouth Rinse BID  . Chlorhexidine Gluconate Cloth  6 each Topical Q0600  . cholecalciferol  1,000 Units Per Tube Daily  . [START ON 03/19/2019] darbepoetin (ARANESP) injection - NON-DIALYSIS  150 mcg Subcutaneous Q Sun-1800  . feeding supplement (PRO-STAT SUGAR FREE 64)  60 mL Per Tube TID  . free water  300 mL Per Tube Q8H  . heparin injection (subcutaneous)  7,500 Units Subcutaneous Q8H  . insulin aspart  0-20 Units Subcutaneous Q4H  . insulin glargine  20 Units Subcutaneous BID  . ipratropium-albuterol  3 mL Nebulization Q6H  . mouth rinse  15 mL Mouth Rinse 10 times per day  . pantoprazole sodium  40 mg Per Tube Daily  . polyethylene glycol  17 g Per Tube BID  . senna  2 tablet Per Tube QHS  . sodium chloride flush  10-40 mL Intracatheter Q12H   .  prismasol BGK 4/2.5 400 mL/hr at 03/16/19 0425  .  prismasol BGK 4/2.5 200 mL/hr at 03/16/19 0425  . sodium chloride Stopped (03/16/19 0440)  . ceFEPime (MAXIPIME) IV 2 g (03/16/19 0922)  . feeding supplement (VITAL HIGH PROTEIN) 1,000 mL (03/16/19 0604)  . fentaNYL infusion INTRAVENOUS Stopped (03/15/19 1804)  . heparin 10,000 units/ 20 mL infusion syringe 500 Units/hr (03/16/19 0937)  . norepinephrine (LEVOPHED) Adult infusion 18 mcg/min (03/16/19 0800)  . prismasol BGK 4/2.5  1,800 mL/hr at 03/16/19 0646  . propofol (DIPRIVAN) infusion 15 mcg/kg/min (03/16/19 0800)  . vancomycin Stopped (03/15/19 1405)   sodium chloride, acetaminophen **OR** acetaminophen, albuterol, alteplase, bisacodyl, dextrose, EPINEPHrine, fentaNYL (SUBLIMAZE) injection, fentaNYL (SUBLIMAZE) injection, guaiFENesin-dextromethorphan, heparin, heparin, hydrALAZINE, lip balm, midazolam, ondansetron **OR** ondansetron (ZOFRAN) IV, sodium chloride, sodium chloride flush, sodium phosphate    Exam: Due to COVID restrictions and isolation- pt was not seen by in an effort to preserve PPE and to minimize exposure to providers and other patients.  Temp HD cath- right IJ placed 7/11   Home meds of note:  - lisinopril 20 qd/ hydrochlorothiazide 25 qd   - duloxetine 30 qd/ gabapentin 300 tid/ vicodin qid prn  - naproxen 440 bid prn/    UNa 76, UCr 58  (02/27/19)   Date                            Creat               eGFR  2009- 11/2016              1.3- 1.8            32- 50  CKD III  05/2017                          2.61                 21  CKD IV  02/08/2019- 03/11/19         7.09 >> 4.14    Renal US >  8.8/ 10 cm kidneys, ^echogenicity c/w CKD, no hydro  CXR 7/11 > Patchy bilateral opacities, left greater than right, worrisome for an infectious process  total I/O 6/29 - 7/10 = 28L in and 27.8 L out, +365  admit wt = 77kg, peak 82kg- now 79.8    FeSat 4% on 6/29, ferr 244   Assessment/ Plan: 1. AKI on CKDIV - baseline creat around 2.5 from 2018 which was CKD IV eGFR 21ml/min.  Echogenic kidneys on US also go along w/ advanced renal failure chronically.  Creat 7 on admission due to dehydration, improved to 2.9 then rose to 4.0 and started CRRT on 7/11.  Plan cont CRRT for now, heparin 500u/hr flat rate. Numbers have improved on CRRT  2. Volume -  CVP - lower  CXR's not helpful w/ bilat infiltrates.  no sig edema on exam. On low dose pressors-  keeping even on machine.  3. DM2 on insulin 4. COVID-19 PNA /  vent dep resp failure: per primary- WBC very high but improving   5. Anemia ckd + blood draws+ other: possibly CKD related in part, Hb 9 on admit , now 7's.  Low Fe sat 6/29. Will start esa - inc dose and IV Fe -(pharmacy told me we cannot give ) . Transfuse prn- given one unit on 7/11 and 7/13.  6. Dec MS- cannot blame on uremia with better numbers - work up in progress  7. Hyponatremia- dec free water- improved  8. Very poor prognosis per CCM-  Family not understanding- still full code 9. Low platelets- checking HIT panel- cont heparin b/c apparently her risk is low for HIT     A   [facility] 03/16/2019, 11:49 AM  Iron/TIBC/Ferritin/ %Sat    Component Value Date/Time   IRON 14 (L) 02/27/2019 0215   TIBC 350 02/27/2019 0215   FERRITIN 154 03/08/2019 0400   IRONPCTSAT 4 (L) 02/27/2019 0215   Recent Labs  Lab 03/16/19 0500  NA 134*  K 4.7  CL 99  CO2 25  GLUCOSE 110*  BUN 44*  CREATININE 1.02*  CALCIUM 8.8*  PHOS 3.2  ALBUMIN 1.8*   Recent Labs  Lab 03/11/19 0500  AST 32  ALT 48*  ALKPHOS 146*  BILITOT 0.3  PROT 6.0*   Recent Labs  Lab 03/16/19 0500  WBC 35.5*  HGB 7.2*  HCT 24.7*  PLT 51*      

## 2019-03-16 NOTE — Progress Notes (Signed)
NAME:  Nichole Franklin, MRN:  [id number], DOB:  05-Jun-1951, LOS: 81 ADMISSION DATE:  02/07/2019, CONSULTATION DATE:  7/4 REFERRING MD:  Aileen Fass, CHIEF COMPLAINT:  Found confused, tongue swelling   Brief History   68 y/o female with extensive PMH admitted June 28 in the setting of a new COVID 19 diagnosis.  Admitted to [facility] due to renal failure which improved with IV fluids.  Transferred to [facility].  On hospital day 6 she developed worsening confusion and tongue swelling felt to be a reaction to remdesivir; then was transferred to the ICU emergently for intubation.   Past Medical History  Hypertension Hyperlipidemia Diabetes mellitus Gastroesophageal reflux disease History of cervical cancer Anxiety  Significant Hospital Events   June 28 admission [facility] from [facility] for AKI, poor po intake June 30 transfer to [facility], confusion July 2: worsening hypoxemia July 3 remdesivir, worsening confusion July 4 tongue swelling, hypertension, severe encephalopathy, transfer to ICU, intubation, new worsening fever - Admitted to the ICU for worsening confusion, required emergent intubation Tongue swelling noted July 5 > July 7 tongue swelling removed, awake, not extubated July 8 extubated, some stridor July 9 mild increased work of breathing, steroids added, did not sleep the night before, trazodone added, mild delirium July 10 worsening delirium, Intubated for inability to protect airway  July 11 worsening renal function, started on CVVHD July 12 worsening hypotension overnight fever July 13 brief bradycardia event with increased work of breathing, breath stacking on vent July 14: v July 14 some increased work of breathing again Sedation has been off this morning, no meaningful or purposeful interaction on physical exam Remains on CVVHD No seizure activity Full code per goals of care   March 15, 2019: Remains on CRRT.  On fentanyl drip and propofol drip.  40% oxygen on  the ventilator.  On Levophed.  Withdraws to pain.  Has dark toes from the Levophed.  EEG apparently showed slow waves.  Procalcitonin high at 21 afebrile since July 11 with reducing white count.  On vancomycin and cefepime  Consults:  Nephrology, signed off then reconsulted on 7/11 PCCM  Procedures:  July 4 left IJ CVL July 4 endotracheal tube> 7/8 July 10 ETT >  July 8 Cor-Trak gastric tube July 11 right internal jugular hemodialysis catheter  Significant Diagnostic Tests:  June 28 CT scan abdomen pelvis for nephrolithiasis: No explanation of renal failure, no urinary calculus, extensive multifocal groundglass opacity in lungs, sclerotic inferior endplate deformities of T11 and T12 July 4 CT Head > pending  Micro Data:  7/4 resp culture > OPF July 11 blood cultures > NGTD  Antimicrobials/COVID treatment  6/28 ceftriaxone/azithro > July 3 7/3 cefepime >  7/4 Linezolid > 7/5  7/3 remdesivir 6/29 solumedrol > 7/10  March 13, 2019: Vancomycin   Interim history/subjective:   03/16/2019 :  40% fio2 , on diprivan, levophe 2mcg and CRRT. Plat 51 with hgb 7.2gm%. CT head pending - RN concerned about transfer safety Daughter keen on head Ct. Daughter did not want support of palliative services  Objective   Blood pressure (!) 105/41, pulse (!) 59, temperature 98 F (36.7 C), temperature source Oral, resp. rate (!) 24, height 5\' 6"  (1.676 m), weight (!) 165.6 kg, SpO2 97 %.    Vent Mode: PRVC FiO2 (%):  [40 %-60 %] 40 % Set Rate:  [24 bmp] 24 bmp Vt Set:  [410 mL] 410 mL PEEP:  [5 cmH20-10 cmH20] 8 cmH20 Plateau Pressure:  [18 YHC62-37  cmH20] 18 cmH20   Intake/Output Summary (Last 24 hours) at 03/16/2019 1045 Last data filed at 03/16/2019 0700 Gross per 24 hour  Intake 2077.52 ml  Output 1636 ml  Net 441.52 ml   Filed Weights   03/14/19 0500 03/15/19 0434 03/16/19 0500  Weight: 79.8 kg 76.7 kg (!) 165.6 kg    General Appearance:  Looks criticall ill  Head:   Normocephalic, without obvious abnormality, atraumatic Eyes:  PERRL - uyes, conjunctiva/corneas - muddy     Ears:  Normal external ear canals, both ears Nose:  G tube - no Throat:  ETT TUBE - yes , OG tube - yes Neck:  Supple,  No enlargement/tenderness/nodules Lungs: Clear to auscultation bilaterally, Ventilator   Synchrony - yes Heart:  S1 and S2 normal, no murmur, CVP - x.  Pressors - levophed + Abdomen:  Soft, no masses, no organomegaly Genitalia / Rectal:  Not done Extremities:  Extremities- intact Skin:  ntact in exposed areas . Sacral area - not examined Neurologic:  Sedation - diprivan   -> RASS - -3      LABS    PULMONARY Recent Labs  Lab 03/11/19 1539 03/13/19 2130 03/13/19 2146 03/14/19 0503 03/14/19 0557 03/15/19 0612  PHART 7.325*  --  7.306* 7.239* 7.252* 7.333*  PCO2ART 39.1  --  59.5* 66.6* 59.3* 49.6*  PO2ART 73.0*  --  458.0* 326.0* 87.0 66.0*  HCO3 20.1 27.4 29.6* 28.4* 26.0 26.4  TCO2 21* 29 31 30 28 28   O2SAT 92.0 67.0 100.0 100.0 94.0 91.0    CBC Recent Labs  Lab 03/14/19 0420  03/15/19 0100 03/15/19 0612 03/16/19 0500  HGB 8.3*   < > 7.6* 8.5* 7.2*  HCT 27.7*   < > 25.9* 25.0* 24.7*  WBC 40.8*  --  38.6*  --  35.5*  PLT 97*  --  77*  --  51*   < > = values in this interval not displayed.    COAGULATION No results for input(s): INR in the last 168 hours.  CARDIAC  No results for input(s): TROPONINI in the last 168 hours. No results for input(s): PROBNP in the last 168 hours.   CHEMISTRY Recent Labs  Lab 03/12/19 0530  03/13/19 0420  03/14/19 0420  03/14/19 1710 03/15/19 0100 03/15/19 0612 03/15/19 1600 03/16/19 0500  NA 138   137   < > 136   < > 136   < > 134* 133* 132* 131* 134*  K 4.9   4.9   < > 5.1   < > 5.3*   < > 5.4* 4.9 4.6 4.5 4.7  CL 102   101   < > 101   < > 99  --  97* 98  --  97* 99  CO2 25   23   < > 26   < > 26  --  25 26  --  27 25  GLUCOSE 221*   222*   < > 164*   < > 156*  --  187* 207*  --  176* 110*    BUN 94*   94*   < > 53*   < > 48*  --  43* 49*  --  44* 44*  CREATININE 2.83*   2.75*   < > 1.62*   < > 1.48*  --  1.32* 1.26*  --  1.14* 1.02*  CALCIUM 8.2*   8.1*   < > 8.3*   < > 8.7*  --  8.8* 8.9  --  9.0 8.8*  MG 2.3  --  2.3  --  2.5*  --   --  2.5*  --   --  2.3  PHOS 5.1*   < > 4.1   < > 4.6  --  4.4 3.1  --  3.3 3.2   < > = values in this interval not displayed.   Estimated Creatinine Clearance: 84.8 mL/min (A) (by C-G formula based on SCr of 1.02 mg/dL (H)).   LIVER Recent Labs  Lab 03/11/19 0500  03/14/19 0420 03/14/19 1710 03/15/19 0100 03/15/19 1600 03/16/19 0500  AST 32  --   --   --   --   --   --   ALT 48*  --   --   --   --   --   --   ALKPHOS 146*  --   --   --   --   --   --   BILITOT 0.3  --   --   --   --   --   --   PROT 6.0*  --   --   --   --   --   --   ALBUMIN 2.1*   < > 1.9* 1.9* 1.8* 1.7* 1.8*   < > = values in this interval not displayed.     INFECTIOUS Recent Labs  Lab 03/13/19 0420 03/14/19 0420 03/16/19 0500  LATICACIDVEN 1.8  --   --   PROCALCITON 23.09 21.69 34.60     ENDOCRINE CBG (last 3)  Recent Labs    03/15/19 2311 03/16/19 0310 03/16/19 0828  GLUCAP 172* 162* 61*         IMAGING x48h  - image(s) personally visualized  -   highlighted in bold Dg Chest Port 1 View  Result Date: 03/16/2019 CLINICAL DATA:  Hypoxia EXAM: PORTABLE CHEST 1 VIEW COMPARISON:  03/14/2019 FINDINGS: Cardiac shadow is stable. Endotracheal tube, feeding catheter and right jugular temporary dialysis catheter are again seen. Right-sided PICC line is also stable in appearance. The lungs are and well aerated. Increased patchy infiltrates are seen bilaterally primarily within the right lung base. No pneumothorax or sizable effusion is seen. IMPRESSION: Increase in patchy infiltrates bilaterally consistent with the patient's given clinical history of COVID-19 positivity. Electronically Signed   By: Inez Catalina M.D.   On: 03/16/2019 09:06        Resolved Hospital Problem list   Angioedema due to remdesivir, requirement for intubation> resolved Mild stridor 7/9> resolved  Assessment & Plan:  Acute encephalopathy> persistent, no improvement despite several days of hemodialysis, trying to minimize sedation Insomnia/ICU delirium Neuromuscular weakness  7/16 - remains unresponsiv on wakeup assessment  PLAN CT head  Repeat (last 03/10/2019) - dw daughter and she is keen on it  - RN to transport  For head CT depending on safetey   acute on chronic renal failure Continue CVVHD  COVID 19 pneumonia> some degree of lung injury noted on CXR/ABG S/p solumedrol 10 days, I don't think there is a role for using more Acute respiratory failure with hypoxemia due to inability to protect airway/COVID 19 pneumonia Remdesivir allergy Vent dyssynchrony/air hunger due to ARDS from COVID   03/16/2019 - > does not meet criteria for SBT/Extubation in setting of Acute Respiratory Failure due to encephalopathy and MODS   PLNA -Full mechanical ventilator support   Circulatory shock Levophed titrated to MAP > 65   Goals of care 7/14 - Dr Lake Bells did lengthy conversation with  the patient's daughter today 03/14/2019.  I expressed my concern that her overall condition is poor and she is not doing very well with multiorgan failure.  I explained that any sort of recovery would take weeks to months.  I talked about CODE STATUS including whether or not it would be appropriate to perform CPR.  I explained that this would be unlikely to provide medical benefit and would cause harm.  At this point the patient's daughter said she wants everything done for her mother including CPR.  Is not clear that she has an understanding of the overall severity and poor prognosis despite me attempting to describe this over the last few days.  We will ask palliative medicine to call her to help discuss goals of care.  Overall I do not think that she has a good chance  of surviving this hospitalization.  7/15 - Daughter did not want to engage with palliative care   7/16- called daughter - went to VM. Might have to make patient 2 person DNR  Best practice:  Diet: continue tube feeding Pain/Anxiety/Delirium protocol (if indicated): RASS -2, continue fentanyl and propofol VAP protocol (if indicated): yes DVT prophylaxis: sub q heparin GI prophylaxis: famotidine Glucose control: SSI Mobility: bed rest Code Status: full Family Communication: Delma Officer -  Updated  Disposition: remain in ICU     ATTESTATION & SIGNATURE   The patient Nichole Franklin is critically ill with multiple organ systems failure and requires high complexity decision making for assessment and support, frequent evaluation and titration of therapies, application of advanced monitoring technologies and extensive interpretation of multiple databases.   Critical Care Time devoted to patient care services described in this note is  30  Minutes. This time reflects time of care of this signee Dr Brand Males. This critical care time does not reflect procedure time, or teaching time or supervisory time of PA/NP/Med student/Med Resident etc but could involve care discussion time     Dr. Brand Males, M.D., [facility].C.P Pulmonary and Critical Care Medicine Staff Physician [place] [facility] Pager: [phone], If no answer or between  15:00h - 7:00h: call [phone]  03/16/2019 10:45 AM

## 2019-03-16 NOTE — Progress Notes (Signed)
Spoke to daughter stephanie on phone and via facetime. Update provided. daughter was appreciative.

## 2019-03-16 NOTE — Progress Notes (Signed)
Patients daughter called for update and face time opportunity with her mother. All questions answered and daughter was able to tell me fun stories about her mom. pts daughter expressed thanks for all we are doing to treat her mother. Patients daughter states she has a clear understanding of her mothers medical condition, she verbally encouraged her mother "to get better", and also gave her mother permission to "pass if she felt the call"

## 2019-03-16 NOTE — Progress Notes (Signed)
PROGRESS NOTE  Nichole Franklin EYC:[id number] DOB: March 15, 1951 DOA: 02/09/2019  PCP: Elwyn Reach, MD  Brief History/Interval Summary:  -Patient is a 68 y.o. female with PMHx of DM-2, HTN, dyslipidemia, CKD stage IV who presented to the hospital initially for evaluation of weakness, she was found to have fever and acute kidney injury.  She was subsequently admitted to [facility] her renal function stabilized-she was transferred to [facility] course since admission has been complicated by encephalopathy, angioedema following initiation of Remdesivir-and respiratory failure requiring intubation on 7/4.   Subjective/Interval History: Patient remains intubated and sedated.  No significant events overnight as discussed with staff  Assessment/Plan:  Acute Hypoxic Resp. Failure due to Acute Covid 19 Viral Illness/sepsis  Vent Mode: PRVC FiO2 (%):  [40 %-60 %] 40 % Set Rate:  [24 bmp] 24 bmp Vt Set:  [410 mL] 410 mL PEEP:  [5 cmH20-10 cmH20] 8 cmH20 Plateau Pressure:  [18 cmH20-30 cmH20] 28 cmH20     Component Value Date/Time   PHART 7.333 (L) 03/15/2019 0612   PCO2ART 49.6 (H) 03/15/2019 0612   PO2ART 66.0 (L) 03/15/2019 0612   HCO3 26.4 03/15/2019 0612   TCO2 28 03/15/2019 0612   ACIDBASEDEF 2.0 03/14/2019 0557   O2SAT 91.0 03/15/2019 0612    COVID-19 Labs  No results for input(s): DDIMER, FERRITIN, LDH, CRP in the last 72 hours.  Lab Results  Component Value Date   SARSCOV2NAA POSITIVE (A) 02/08/2019     Fever: Hypothermia appears to have resolved.  T-max of 101.5 F on 7/11 Oxygen requirements: On mechanical ventilation.  40% FiO2.  Saturating in the 90s.     Antibiotics: Cefepime, day 11.  Vancomycin was added 7/12.   Remdesivir: Patient developed angioedema to Remdesivir Steroids: She has been taken off of steroids due to encephalopathy. Diuretics: She has received furosemide during this hospitalization. Last dose was on 7/9  Actemra: Did not receive Convalescent Plasma: Did not receive Vitamin C and Zinc: Continue DVT Prophylaxis: On heparin 7500 units subcutaneously every 8 hours  Patient was initially intubated due to angioedema thought to be secondary to Remdesivir.  Angioedema resolved.  Patient was extubated on 7/8.  She did have some stridor at the same evening which was treated with nebulizer treatments and steroids.  This has improved.  She had significant altered mentation, which continued to be significantly encephalopathic, she was intubated 7/10 for airway protection . -She remains significantly encephalopathic despite minimizing sedation, repeat CT head pending, was normal on 7/10. - is difficult to wean in the setting of her encephalopathy, vent management per PCCM  Patient remains on mechanical ventilation.  Pulmonology is following.  Patient developed a fever couple of days ago.  Concern was for sepsis.  As well she is having significantly elevated white blood cell count, and procalcitonin, continue with empiric antibiotic coverage with vancomycin, cefepime, repeat blood cultures.  Blood cultures done on 7/11 remains negative.  Acute kidney injury on chronic kidney disease stage IV Baseline creatinine around 1.6-2.  She initially presented with a BUN of 81 and a creatinine of 7.09.  Renal function had improved.  But then it started getting worse again a few days ago.  She had a decrease in her urine output.  Nephrology was consulted.  Dialysis catheter was placed.  Patient was started on CRRT.  CRRT management per renal  Thrombocytopenia Tenuous to have significant drop in her platelet count, will check HIT antibody, low score probability for HIT,  for now continue with heparin through CRRT and for DVT prophylaxis.  Septic shock Continues to require Levophed.  Procalcitonin continues to increase despite starting broad-spectrum antibiotic coverage, repeat blood cultures remain negative, all her lines are  new, x-ray with no acute findings, could not identify source of her sepsis yet, remains with significantly elevated leukocytosis, but it is trending down .  Acute metabolic encephalopathy She had encephalopathy earlier during the course of the hospitalization thought to be due to hypoxemia and other acute metabolic derangement.  CT scan done on 7/4 did not show any acute findings.  After extubation her mentation has been progressively getting worse.  She had a CT scan repeated on 7/10 which did not show any acute findings.  She had to be reintubated as she was not able to protect her airway.  Mentation could also be altered due to uremia.  Steroids were discontinued.  Her BUN has improved with CRRT.  CT scan to be repeated.  EEG.  Discussed briefly with neurology as well.   Elevated d-dimer Patient was empirically started on IV heparin on 7/4 due to concern for VTE.  Due to drop in hemoglobin this was discontinued.  Lower extremity Doppler study was negative for DVT.  Clinically there was low suspicion for VTE.  D-dimer continues to trend down.    Angioedema Most likely secondary to Remdesivir.  Tongue swelling has improved.  Hypernatremia Resolved with free water.  Monitor electrolytes closely.  Diabetes mellitus type 2 with uncontrolled hyperglycemia HbA1c 9.3.  Patient is on Lantus insulin and SSI.  CBGs are reasonably well controlled.  Initially was on IV insulin.    Normocytic anemia  Patient has required transfusion of 3 PRBCs (7/6, 7/11, 7/13).  No evidence for overt bleeding noted.  Hemoglobin has responded appropriately to blood transfusion.  Nutrition Being fed through a feeding tube.   Goals of care: -Patient is critically ill, with multiorgan failure, she has made very little improvement over last couple weeks, remains significantly encephalopathic, vent dependent, unsuccessful weaning so far, bicytopenia, anemia, renal failure, respiratory failure, encephalopathic, patient would  not survive event of cardiac arrest, feels strongly that intervention of CVR would cause excessive exposure to COVID from caregiver, and mainly would not provide any medical benefit to this patient, I feel her CODE STATUS should be changed to DNR, I have discussed with her current ICU consultant, previous ICU consultant, both would feel the same.   DVT Prophylaxis: Subcutaneous Heparin  PUD Prophylaxis: Protonix Code Status: Full code Family Communication: Pulmonology has been discussing with patient's family. Disposition Plan: Remain in ICU for now Anti-infectives (From admission, onward)   Start     Dose/Rate Route Frequency Ordered Stop   03/13/19 1400  vancomycin (VANCOCIN) IVPB 1000 mg/200 mL premix     1,000 mg 200 mL/hr over 60 Minutes Intravenous Every 24 hours 03/12/19 1151     03/12/19 1100  vancomycin (VANCOCIN) 1,750 mg in sodium chloride 0.9 % 500 mL IVPB     1,750 mg 250 mL/hr over 120 Minutes Intravenous  Once 03/12/19 1056 03/12/19 1500   03/11/19 2230  ceFEPIme (MAXIPIME) 2 g in sodium chloride 0.9 % 100 mL IVPB     2 g 200 mL/hr over 30 Minutes Intravenous Every 12 hours 03/11/19 2150     03/07/19 1000  ceFEPIme (MAXIPIME) 2 g in sodium chloride 0.9 % 100 mL IVPB  Status:  Discontinued     2 g 200 mL/hr over 30 Minutes Intravenous Every 24  hours 03/06/19 1434 03/11/19 2150   03/04/19 2200  ceFEPIme (MAXIPIME) 1 g in sodium chloride 0.9 % 100 mL IVPB  Status:  Discontinued     1 g 200 mL/hr over 30 Minutes Intravenous Every 12 hours 03/04/19 1256 03/06/19 1434   03/04/19 1400  remdesivir 100 mg in sodium chloride 0.9 % 250 mL IVPB  Status:  Discontinued     100 mg 500 mL/hr over 30 Minutes Intravenous Every 24 hours 03/03/19 1305 03/04/19 0936   03/04/19 1200  linezolid (ZYVOX) IVPB 600 mg  Status:  Discontinued     600 mg 300 mL/hr over 60 Minutes Intravenous Every 12 hours 03/04/19 1141 03/05/19 1100   03/04/19 0900  linezolid (ZYVOX) IVPB 600 mg  Status:   Discontinued     600 mg 300 mL/hr over 60 Minutes Intravenous Every 12 hours 03/04/19 0759 03/04/19 1141   03/04/19 0800  ceFEPIme (MAXIPIME) 2 g in sodium chloride 0.9 % 100 mL IVPB  Status:  Discontinued     2 g 200 mL/hr over 30 Minutes Intravenous Every 12 hours 03/04/19 0706 03/04/19 1256   03/03/19 1400  remdesivir 200 mg in sodium chloride 0.9 % 250 mL IVPB     200 mg 500 mL/hr over 30 Minutes Intravenous Once 03/03/19 1305 03/03/19 1500   02/27/19 2000  azithromycin (ZITHROMAX) 500 mg in sodium chloride 0.9 % 250 mL IVPB  Status:  Discontinued     500 mg 250 mL/hr over 60 Minutes Intravenous Every 24 hours 02/27/19 0428 03/04/19 1211   02/27/19 0430  cefTRIAXone (ROCEPHIN) 1 g in sodium chloride 0.9 % 100 mL IVPB  Status:  Discontinued     1 g 200 mL/hr over 30 Minutes Intravenous Every 24 hours 02/27/19 0428 03/04/19 0706      Medications:  Scheduled: . sodium chloride   Intravenous Once  . sodium chloride   Intravenous Once  . chlorhexidine gluconate (MEDLINE KIT)  15 mL Mouth Rinse BID  . Chlorhexidine Gluconate Cloth  6 each Topical Q0600  . cholecalciferol  1,000 Units Per Tube Daily  . [START ON 03/19/2019] darbepoetin (ARANESP) injection - NON-DIALYSIS  150 mcg Subcutaneous Q Sun-1800  . feeding supplement (PRO-STAT SUGAR FREE 64)  60 mL Per Tube TID  . free water  300 mL Per Tube Q8H  . heparin injection (subcutaneous)  5,000 Units Subcutaneous Q8H  . insulin aspart  0-20 Units Subcutaneous Q4H  . insulin glargine  20 Units Subcutaneous BID  . ipratropium-albuterol  3 mL Nebulization Q6H  . mouth rinse  15 mL Mouth Rinse 10 times per day  . pantoprazole sodium  40 mg Per Tube Daily  . polyethylene glycol  17 g Per Tube BID  . senna  2 tablet Per Tube QHS  . sodium chloride flush  10-40 mL Intracatheter Q12H   Continuous: .  prismasol BGK 4/2.5 400 mL/hr at 03/16/19 0425  .  prismasol BGK 4/2.5 200 mL/hr at 03/16/19 0425  . sodium chloride Stopped (03/16/19  0440)  . ceFEPime (MAXIPIME) IV Stopped (03/16/19 6270)  . feeding supplement (VITAL HIGH PROTEIN) 1,000 mL (03/16/19 0604)  . fentaNYL infusion INTRAVENOUS 100 mcg/hr (03/16/19 1300)  . heparin 10,000 units/ 20 mL infusion syringe 500 Units/hr (03/16/19 0937)  . norepinephrine (LEVOPHED) Adult infusion 18 mcg/min (03/16/19 1300)  . prismasol BGK 4/2.5 1,800 mL/hr at 03/16/19 1200  . propofol (DIPRIVAN) infusion 10 mcg/kg/min (03/16/19 1300)  . vancomycin 1,000 mg (03/16/19 1436)   JJK:KXFGHW chloride, acetaminophen **  OR** acetaminophen, albuterol, alteplase, bisacodyl, dextrose, EPINEPHrine, fentaNYL (SUBLIMAZE) injection, fentaNYL (SUBLIMAZE) injection, guaiFENesin-dextromethorphan, heparin, heparin, hydrALAZINE, lip balm, midazolam, ondansetron **OR** ondansetron (ZOFRAN) IV, sodium chloride, sodium chloride flush, sodium phosphate   Objective:  Vital Signs  Vitals:   03/16/19 1100 03/16/19 1204 03/16/19 1300 03/16/19 1400  BP: (!) 94/35 (!) 101/44 (!) 112/45 (!) 122/46  Pulse: (!) 53 (!) 56 (!) 57 61  Resp: (!) 24 (!) 24 (!) 24 (!) 27  Temp:      TempSrc:      SpO2: 97% 95% 97% 99%  Weight:      Height:        Intake/Output Summary (Last 24 hours) at 03/16/2019 1451 Last data filed at 03/16/2019 1300 Gross per 24 hour  Intake 1633.72 ml  Output 1750 ml  Net -116.28 ml   Filed Weights   03/14/19 0500 03/15/19 0434 03/16/19 0500  Weight: 79.8 kg 76.7 kg (!) 165.6 kg    Sedated, intubated, in no apparent distress. Symmetrical Chest wall movement, Good air movement bilaterally, CTAB RRR,No Gallops,Rubs or new Murmurs, No Parasternal Heave +ve B.Sounds, Abd Soft, No tenderness, No rebound - guarding or rigidity. No Cyanosis, Clubbing or edema, No new Rash or bruise     Lab Results:  Data Reviewed: I have personally reviewed following labs and imaging studies  CBC: Recent Labs  Lab 03/12/19 0530 03/13/19 0420 03/13/19 1525  03/14/19 0420 03/14/19 0503 03/14/19  0557 03/15/19 0100 03/15/19 0612 03/16/19 0500  WBC 46.9* 43.9* 38.4*  --  40.8*  --   --  38.6*  --  35.5*  NEUTROABS 39.9* 37.2*  --   --  35.1*  --   --  33.8*  --  30.2*  HGB 8.3* 7.3* 7.1*   < > 8.3* 9.2* 9.2* 7.6* 8.5* 7.2*  HCT 26.8* 24.8* 24.0*   < > 27.7* 27.0* 27.0* 25.9* 25.0* 24.7*  MCV 93.7 94.7 94.5  --  96.9  --   --  97.0  --  98.4  PLT 136* 116* 104*  --  97*  --   --  77*  --  51*   < > = values in this interval not displayed.    Basic Metabolic Panel: Recent Labs  Lab 03/12/19 0530  03/13/19 0420  03/14/19 0420  03/14/19 1710 03/15/19 0100 03/15/19 0612 03/15/19 1600 03/16/19 0500  NA 138  137   < > 136   < > 136   < > 134* 133* 132* 131* 134*  K 4.9  4.9   < > 5.1   < > 5.3*   < > 5.4* 4.9 4.6 4.5 4.7  CL 102  101   < > 101   < > 99  --  97* 98  --  97* 99  CO2 25  23   < > 26   < > 26  --  25 26  --  27 25  GLUCOSE 221*  222*   < > 164*   < > 156*  --  187* 207*  --  176* 110*  BUN 94*  94*   < > 53*   < > 48*  --  43* 49*  --  44* 44*  CREATININE 2.83*  2.75*   < > 1.62*   < > 1.48*  --  1.32* 1.26*  --  1.14* 1.02*  CALCIUM 8.2*  8.1*   < > 8.3*   < > 8.7*  --  8.8* 8.9  --  9.0 8.8*  MG 2.3  --  2.3  --  2.5*  --   --  2.5*  --   --  2.3  PHOS 5.1*   < > 4.1   < > 4.6  --  4.4 3.1  --  3.3 3.2   < > = values in this interval not displayed.    GFR: Estimated Creatinine Clearance: 84.8 mL/min (A) (by C-G formula based on SCr of 1.02 mg/dL (H)).  Liver Function Tests: Recent Labs  Lab 03/11/19 0500  03/14/19 0420 03/14/19 1710 03/15/19 0100 03/15/19 1600 03/16/19 0500  AST 32  --   --   --   --   --   --   ALT 48*  --   --   --   --   --   --   ALKPHOS 146*  --   --   --   --   --   --   BILITOT 0.3  --   --   --   --   --   --   PROT 6.0*  --   --   --   --   --   --   ALBUMIN 2.1*   < > 1.9* 1.9* 1.8* 1.7* 1.8*   < > = values in this interval not displayed.     CBG: Recent Labs  Lab 03/15/19 1926 03/15/19 2311 03/16/19  0310 03/16/19 0828 03/16/19 1135  GLUCAP 171* 172* 162* 61* 106*      Recent Results (from the past 240 hour(s))  Culture, blood (routine x 2)     Status: None (Preliminary result)   Collection Time: 03/11/19 10:18 PM   Specimen: BLOOD  Result Value Ref Range Status   Specimen Description BLOOD RIGHT ANTECUBITAL  Final   Special Requests   Final    BOTTLES DRAWN AEROBIC AND ANAEROBIC Blood Culture adequate volume   Culture   Final    NO GROWTH 4 DAYS Performed at [facility], [place] [address] [place], [place] [address]    Report Status PENDING  Incomplete  Culture, blood (routine x 2)     Status: None (Preliminary result)   Collection Time: 03/11/19 10:23 PM   Specimen: BLOOD  Result Value Ref Range Status   Specimen Description BLOOD RIGHT ANTECUBITAL  Final   Special Requests   Final    BOTTLES DRAWN AEROBIC AND ANAEROBIC Blood Culture adequate volume   Culture   Final    NO GROWTH 4 DAYS Performed at [facility], [place] [address], [place], [place] [address]    Report Status PENDING  Incomplete  Culture, blood (routine x 2)     Status: None (Preliminary result)   Collection Time: 03/15/19  7:10 PM   Specimen: BLOOD  Result Value Ref Range Status   Specimen Description   Final    BLOOD RIGHT ANTECUBITAL Performed at [facility] [place] [place] [address] [place], [place] [address]    Special Requests   Final    BOTTLES DRAWN AEROBIC AND ANAEROBIC Blood Culture adequate volume Performed at [place] [address], [place], [place] [address]    Culture   Final    NO GROWTH < 24 HOURS Performed at [facility] [address] [place], [place] [address]    Report Status PENDING  Incomplete  Culture, blood (routine x 2)     Status: None (Preliminary result)   Collection Time:  03/15/19  7:20 PM   Specimen: BLOOD  Result Value Ref Range Status   Specimen Description   Final    BLOOD BLOOD RIGHT FOREARM Performed at  [facility] [address], [place], [place] [address]    Special Requests   Final    BOTTLES DRAWN AEROBIC ONLY Blood Culture adequate volume Performed at [facility] [address], [place], [place] [address]    Culture   Final    NO GROWTH < 24 HOURS Performed at [facility] [address], [place],  [address]    Report Status PENDING  Incomplete      Radiology Studies: Dg Chest Port 1 View  Result Date: 03/16/2019 CLINICAL DATA:  Hypoxia EXAM: PORTABLE CHEST 1 VIEW COMPARISON:  03/14/2019 FINDINGS: Cardiac shadow is stable. Endotracheal tube, feeding catheter and right jugular temporary dialysis catheter are again seen. Right-sided PICC line is also stable in appearance. The lungs are and well aerated. Increased patchy infiltrates are seen bilaterally primarily within the right lung base. No pneumothorax or sizable effusion is seen. IMPRESSION: Increase in patchy infiltrates bilaterally consistent with the patient's given clinical history of COVID-19 positivity. Electronically Signed   By: Inez Catalina M.D.   On: 03/16/2019 09:06       LOS: 18 days   Phillips Climes MD  [facility] Pager on [email or web address]  03/16/2019, 2:51 PM

## 2019-03-16 NOTE — Progress Notes (Signed)
Pharmacy Heparin Induced Thrombocytopenia (HIT) Note:  Nichole Franklin is an 68 y.o. female admitted on 6/28 for COVID-19 pneumonia, now being evaluated for HIT. Heparin was started for VTE prophylaxis on 6/29, and baseline platelets were 289.   HIT labs were ordered on 7/16 when platelets dropped to 51.    Auto-populate labs: No results found for: HEPINDPLTAB, SRALOWDOSEHP, SRAHIGHDOSEH    CALCULATE SCORE:  4Ts (see the HIT Algorithm) Score  Thrombocytopenia 2; >50% Plt decrease  Timing 1; Plt fall after day 10  Thrombosis 0; none suspected  Other causes of thrombocytopenia 0; probable other causes  Total 3     Recommendations (A or B or C) are based on available lab results:  A. No lab results available (HIT antibody and/or SRA ordered): -Low HIT probability: still obtain HIT Ab, continue heparin/LMWH, SRA not recommended; no heparin allergy documentation needed  B. HIT antibody result available: -N/A - HIT antibody not available, and/or SRA not available  C. SRA result available -SRA not available  Name of MD Contacted: Dr. Waldron Labs  Plan (Discussed with provider) Labs ordered: -Heparin antibody Heparin allergy: -no allergy documentation needed Anticoagulation plans - continue Heparin SQ and continue Heparin in CRRT circuit.   Gretta Arab PharmD, BCPS Clinical pharmacist phone 7am- 5pm: [phone] 03/16/2019 11:10 AM

## 2019-03-16 NOTE — Progress Notes (Signed)
[facility] PCCM/[facility] CMO  I've been involved in the patient's care for the last two weeks and understand her situation well.  She has made very little progress over the last several weeks and has made little progress. Based on her profound encephalopathy and generalized weakness, she would not survive in the event of cardiac arrest.  I feel strongly that the intervention of CPR would cause excessive exposure to COVID from caregivers and would provide NO MEDICAL BENEFIT to the patient.  I feel her code status should be changed to DNR.    Roselie Awkward, MD [place] [facility] Pager: [phone] Cell: [phone] If no response, call [phone]

## 2019-03-16 NOTE — Progress Notes (Signed)
Called pt daughter listed with attempt to give update on her moms current status. No answer.leftt voice mail with my name and that I will try again .

## 2019-03-17 ENCOUNTER — Inpatient Hospital Stay (HOSPITAL_COMMUNITY): Payer: Medicare HMO

## 2019-03-17 DIAGNOSIS — R579 Shock, unspecified: Secondary | ICD-10-CM

## 2019-03-17 LAB — CBC WITH DIFFERENTIAL/PLATELET
Abs Immature Granulocytes: 1.9 10*3/uL — ABNORMAL HIGH (ref 0.00–0.07)
Band Neutrophils: 21 %
Basophils Absolute: 0 10*3/uL (ref 0.0–0.1)
Basophils Relative: 0 %
Eosinophils Absolute: 0 10*3/uL (ref 0.0–0.5)
Eosinophils Relative: 0 %
HCT: 26.8 % — ABNORMAL LOW (ref 36.0–46.0)
Hemoglobin: 8.1 g/dL — ABNORMAL LOW (ref 12.0–15.0)
Lymphocytes Relative: 6 %
Lymphs Abs: 1.9 10*3/uL (ref 0.7–4.0)
MCH: 29.6 pg (ref 26.0–34.0)
MCHC: 30.2 g/dL (ref 30.0–36.0)
MCV: 97.8 fL (ref 80.0–100.0)
Metamyelocytes Relative: 5 %
Monocytes Absolute: 0.6 10*3/uL (ref 0.1–1.0)
Monocytes Relative: 2 %
Myelocytes: 1 %
Neutro Abs: 26.9 10*3/uL — ABNORMAL HIGH (ref 1.7–7.7)
Neutrophils Relative %: 65 %
Platelets: 42 10*3/uL — ABNORMAL LOW (ref 150–400)
RBC: 2.74 MIL/uL — ABNORMAL LOW (ref 3.87–5.11)
RDW: 15.9 % — ABNORMAL HIGH (ref 11.5–15.5)
WBC Morphology: INCREASED
WBC: 31.3 10*3/uL — ABNORMAL HIGH (ref 4.0–10.5)
nRBC: 3.5 % — ABNORMAL HIGH (ref 0.0–0.2)

## 2019-03-17 LAB — RENAL FUNCTION PANEL
Albumin: 1.7 g/dL — ABNORMAL LOW (ref 3.5–5.0)
Albumin: 1.4 g/dL — ABNORMAL LOW (ref 3.5–5.0)
Anion gap: 9 (ref 5–15)
Anion gap: 10 (ref 5–15)
BUN: 33 mg/dL — ABNORMAL HIGH (ref 8–23)
BUN: 34 mg/dL — ABNORMAL HIGH (ref 8–23)
CO2: 27 mmol/L (ref 22–32)
CO2: 24 mmol/L (ref 22–32)
Calcium: 9.5 mg/dL (ref 8.9–10.3)
Calcium: 9 mg/dL (ref 8.9–10.3)
Chloride: 99 mmol/L (ref 98–111)
Chloride: 103 mmol/L (ref 98–111)
Creatinine, Ser: 0.89 mg/dL (ref 0.44–1.00)
Creatinine, Ser: 1.2 mg/dL — ABNORMAL HIGH (ref 0.44–1.00)
GFR calc Af Amer: >60 mL/min (ref >60)
GFR calc Af Amer: 54 mL/min — ABNORMAL LOW (ref >60)
GFR calc non Af Amer: >60 mL/min (ref >60)
GFR calc non Af Amer: 46 mL/min — ABNORMAL LOW (ref >60)
Glucose, Bld: 140 mg/dL — ABNORMAL HIGH (ref 70–99)
Glucose, Bld: 64 mg/dL — ABNORMAL LOW (ref 70–99)
Phosphorus: 2.6 mg/dL (ref 2.5–4.6)
Phosphorus: 4 mg/dL (ref 2.5–4.6)
Potassium: 4.8 mmol/L (ref 3.5–5.1)
Potassium: 4.8 mmol/L (ref 3.5–5.1)
Sodium: 135 mmol/L (ref 135–145)
Sodium: 137 mmol/L (ref 135–145)

## 2019-03-17 LAB — MAGNESIUM: Magnesium: 2.6 mg/dL — ABNORMAL HIGH (ref 1.7–2.4)

## 2019-03-17 LAB — PROCALCITONIN: Procalcitonin: 36.94 ng/mL

## 2019-03-17 LAB — CULTURE, BLOOD (ROUTINE X 2)
Culture: NO GROWTH
Culture: NO GROWTH
Special Requests: ADEQUATE
Special Requests: ADEQUATE

## 2019-03-17 LAB — GLUCOSE, CAPILLARY
Glucose-Capillary: 121 mg/dL — ABNORMAL HIGH (ref 70–99)
Glucose-Capillary: 129 mg/dL — ABNORMAL HIGH (ref 70–99)
Glucose-Capillary: 57 mg/dL — ABNORMAL LOW (ref 70–99)
Glucose-Capillary: 77 mg/dL (ref 70–99)
Glucose-Capillary: 98 mg/dL (ref 70–99)

## 2019-03-17 LAB — HEPARIN INDUCED PLATELET AB (HIT ANTIBODY): Heparin Induced Plt Ab: 0.264 OD (ref 0.000–0.400)

## 2019-03-17 LAB — TRIGLYCERIDES: Triglycerides: 102 mg/dL (ref <150)

## 2019-03-17 LAB — APTT: aPTT: 39 seconds — ABNORMAL HIGH (ref 24–36)

## 2019-03-17 MED ORDER — DEXTROSE 5 % IV SOLN
INTRAVENOUS | Status: DC
  Administered 2019-03-17: via INTRAVENOUS

## 2019-03-17 MED ORDER — VANCOMYCIN HCL IN DEXTROSE 1-5 GM/200ML-% IV SOLN
1000.0000 mg | INTRAVENOUS | Status: AC
  Administered 2019-03-17: 1000 mg via INTRAVENOUS
  Filled 2019-03-17: qty 200

## 2019-03-17 MED ORDER — SODIUM CHLORIDE 0.9 % IV SOLN
1.0000 g | INTRAVENOUS | Status: DC
  Filled 2019-03-17: qty 1

## 2019-03-17 MED ORDER — MIDAZOLAM HCL 2 MG/2ML IJ SOLN
1.0000 mg | INTRAMUSCULAR | Status: DC | PRN
  Administered 2019-03-17: 2 mg via INTRAVENOUS
  Filled 2019-03-17: qty 2

## 2019-03-17 MED ORDER — MIDAZOLAM 50MG/50ML (1MG/ML) PREMIX INFUSION
0.0000 mg/h | INTRAVENOUS | Status: DC
  Administered 2019-03-17: 3 mg/h via INTRAVENOUS
  Administered 2019-03-17: 2 mg/h via INTRAVENOUS
  Filled 2019-03-17 (×2): qty 50

## 2019-03-17 MED ORDER — MIDAZOLAM BOLUS VIA INFUSION
1.0000 mg | INTRAVENOUS | Status: DC | PRN
  Filled 2019-03-17: qty 2

## 2019-03-17 NOTE — Progress Notes (Signed)
Patient brought to private room to meet with family by myself and RT,. Patient appears peaceful and comfortable. Emotional support ongoing.

## 2019-03-17 NOTE — Progress Notes (Signed)
NAME:  Nichole Franklin, MRN:  [id number], DOB:  1951-06-06, LOS: 75 ADMISSION DATE:  02/16/2019, CONSULTATION DATE:  7/4 REFERRING MD:  Aileen Fass, CHIEF COMPLAINT:  Found confused, tongue swelling   Brief History   68 y/o female with extensive PMH admitted June 28 in the setting of a new COVID 19 diagnosis.  Admitted to [facility] due to renal failure which improved with IV fluids.  Transferred to [facility].  On hospital day 6 she developed worsening confusion and tongue swelling felt to be a reaction to remdesivir; then was transferred to the ICU emergently for intubation.   Past Medical History  Hypertension Hyperlipidemia Diabetes mellitus Gastroesophageal reflux disease History of cervical cancer Anxiety  Significant Hospital Events   June 28 admission [facility] from [facility] for AKI, poor po intake June 30 transfer to [facility], confusion July 2: worsening hypoxemia July 3 remdesivir, worsening confusion July 4 tongue swelling, hypertension, severe encephalopathy, transfer to ICU, intubation, new worsening fever - Admitted to the ICU for worsening confusion, required emergent intubation Tongue swelling noted July 5 > July 7 tongue swelling removed, awake, not extubated July 8 extubated, some stridor July 9 mild increased work of breathing, steroids added, did not sleep the night before, trazodone added, mild delirium July 10 worsening delirium, Intubated for inability to protect airway  July 11 worsening renal function, started on CVVHD July 12 worsening hypotension overnight fever July 13 brief bradycardia event with increased work of breathing, breath stacking on vent July 14: v July 14 some increased work of breathing again Sedation has been off this morning, no meaningful or purposeful interaction on physical exam Remains on CVVHD No seizure activity Full code per goals of care   March 15, 2019: Remains on CRRT.  On fentanyl drip and propofol drip.  40% oxygen on  the ventilator.  On Levophed.  Withdraws to pain.  Has dark toes from the Levophed.  EEG apparently showed slow waves.  Procalcitonin high at 21 afebrile since July 11 with reducing white count.  On vancomycin and cefepime  03/16/2019 :  40% fio2 , on diprivan, levophe 62mcg and CRRT. Plat 51 with hgb 7.2gm%. CT head pending - RN concerned about transfer safety Daughter keen on head Ct. Daughter did not want support of palliative services  Consults:  Nephrology, signed off then reconsulted on 7/11 PCCM  Procedures:  July 4 left IJ CVL July 4 endotracheal tube> 7/8 July 10 ETT >  July 8 Cor-Trak gastric tube July 11 right internal jugular hemodialysis catheter  Significant Diagnostic Tests:  June 28 CT scan abdomen pelvis for nephrolithiasis: No explanation of renal failure, no urinary calculus, extensive multifocal groundglass opacity in lungs, sclerotic inferior endplate deformities of T11 and T12 July 4 CT Head > pending  Micro Data:  7/4 resp culture > OPF July 11 blood cultures > NGTD  Antimicrobials/COVID treatment  6/28 ceftriaxone/azithro > July 3 7/3 cefepime >  7/4 Linezolid > 7/5  7/3 remdesivir 6/29 solumedrol > 7/10  March 13, 2019: Vancomycin   Interim history/subjective:   03/17/2019 - decline with BP and bradycardia last 30 minutes. CRRT continues  On levophed incrased needs now > 40. Platelet dropped to 42. HITT pending. On sedation gtt with fent and diprivan -> Grimaces, and has gag per RN. Gets dysnchronous with vent  Procal at 69 and higher  Spoke to daughter- who is in tears . Says she realizes mom is dying and this was communicated. She was on verge of  discussing and possibly agreeing to  DNR but said to continue active Rx measures but at this point line got disconnected and could not reach her x 2. Later able to connect - daughter and expressed strong recommendation against CPR. She initially said she wanted CPR but later on explanation against CPR - she  agreed to no CPR. There was a female voice next to her who supported no CPR . She later expressed that she is missing being able to touch and be with her mom physically   D./w [facility] MD and prior CCM MD - Dr Lake Bells - patient will not survive illness - all parties agree on no CPR and no ACLS chemical code and no addition of further pressors.    Objective   Blood pressure (!) 98/41, pulse (!) 52, temperature (!) 97.3 F (36.3 C), temperature source Oral, resp. rate (!) 24, height 5\' 6"  (1.676 m), weight 73 kg, SpO2 90 %.    Vent Mode: PRVC FiO2 (%):  [40 %] 40 % Set Rate:  [24 bmp] 24 bmp Vt Set:  [410 mL] 410 mL PEEP:  [8 cmH20] 8 cmH20 Plateau Pressure:  [25 cmH20-31 cmH20] 31 cmH20   Intake/Output Summary (Last 24 hours) at 03/17/2019 1131 Last data filed at 03/17/2019 1000 Gross per 24 hour  Intake 3572.77 ml  Output 2517 ml  Net 1055.77 ml   Filed Weights   03/15/19 0434 03/16/19 0500 03/17/19 0500  Weight: 76.7 kg (!) 165.6 kg 73 kg     General Appearance:  Looks criticall ill Head:  Normocephalic, without obvious abnormality, atraumatic Eyes:  PERRL - yes, conjunctiva/corneas - muddy     Ears:  Normal external ear canals, both ears Nose:  G tube - no Throat:  ETT TUBE - yesy , OG tube - yes Neck:  Supple,  No enlargement/tenderness/nodules Lungs: Clear to auscultation bilaterally, Ventilator   Synchrony - yes Heart:  S1 and S2 normal, no murmur, CVP - no.  Pressors - levophed Abdomen:  Soft, no masses, no organomegaly Genitalia / Rectal:  Not done Extremities:  Extremities- intact Skin:  ntact in exposed areas . Sacral area - not examined Neurologic:  Sedation - diprivan gtt,   -> RASS - -3/-4       LABS    PULMONARY Recent Labs  Lab 03/11/19 1539 03/13/19 2130 03/13/19 2146 03/14/19 0503 03/14/19 0557 03/15/19 0612  PHART 7.325*  --  7.306* 7.239* 7.252* 7.333*  PCO2ART 39.1  --  59.5* 66.6* 59.3* 49.6*  PO2ART 73.0*  --  458.0* 326.0* 87.0 66.0*    HCO3 20.1 27.4 29.6* 28.4* 26.0 26.4  TCO2 21* 29 31 30 28 28   O2SAT 92.0 67.0 100.0 100.0 94.0 91.0    CBC Recent Labs  Lab 03/16/19 0500 03/16/19 1620 03/17/19 0500  HGB 7.2* 7.2* 8.1*  HCT 24.7* 24.6* 26.8*  WBC 35.5* 38.3* 31.3*  PLT 51* 47* 42*    COAGULATION No results for input(s): INR in the last 168 hours.  CARDIAC  No results for input(s): TROPONINI in the last 168 hours. No results for input(s): PROBNP in the last 168 hours.   CHEMISTRY Recent Labs  Lab 03/13/19 0420  03/14/19 0420  03/15/19 0100 03/15/19 0612 03/15/19 1600 03/16/19 0500 03/16/19 1620 03/17/19 0500  NA 136   < > 136   < > 133* 132* 131* 134* 132* 135  K 5.1   < > 5.3*   < > 4.9 4.6 4.5 4.7 5.0 4.8  CL  101   < > 99   < > 98  --  97* 99 97* 99  CO2 26   < > 26   < > 26  --  27 25 25 27   GLUCOSE 164*   < > 156*   < > 207*  --  176* 110* 174* 140*  BUN 53*   < > 48*   < > 49*  --  44* 44* 34* 33*  CREATININE 1.62*   < > 1.48*   < > 1.26*  --  1.14* 1.02* 0.94 0.89  CALCIUM 8.3*   < > 8.7*   < > 8.9  --  9.0 8.8* 8.9 9.5  MG 2.3  --  2.5*  --  2.5*  --   --  2.3  --  2.6*  PHOS 4.1   < > 4.6   < > 3.1  --  3.3 3.2 3.0 2.6   < > = values in this interval not displayed.   Estimated Creatinine Clearance: 61.9 mL/min (by C-G formula based on SCr of 0.89 mg/dL).   LIVER Recent Labs  Lab 03/11/19 0500  03/15/19 0100 03/15/19 1600 03/16/19 0500 03/16/19 1620 03/17/19 0500  AST 32  --   --   --   --   --   --   ALT 48*  --   --   --   --   --   --   ALKPHOS 146*  --   --   --   --   --   --   BILITOT 0.3  --   --   --   --   --   --   PROT 6.0*  --   --   --   --   --   --   ALBUMIN 2.1*   < > 1.8* 1.7* 1.8* 1.6* 1.7*   < > = values in this interval not displayed.     INFECTIOUS Recent Labs  Lab 03/13/19 0420 03/14/19 0420 03/16/19 0500 03/17/19 0500  LATICACIDVEN 1.8  --   --   --   PROCALCITON 23.09 21.69 34.60 36.94     ENDOCRINE CBG (last 3)  Recent Labs     03/16/19 2332 03/17/19 0410 03/17/19 0710  GLUCAP 129* 129* 121*         IMAGING x48h  - image(s) personally visualized  -   highlighted in bold Dg Chest Port 1 View  Result Date: 03/17/2019 CLINICAL DATA:  Endotracheal tube present. EXAM: PORTABLE CHEST 1 VIEW COMPARISON:  Radiographs of March 16, 2019. FINDINGS: The heart size and mediastinal contours are within normal limits. Endotracheal and feeding tubes are unchanged in position. Right internal jugular catheter is unchanged in position. Right-sided PICC line is unchanged in position. No pneumothorax or pleural effusion is noted. Stable bilateral multifocal airspace opacities are noted. The visualized skeletal structures are unremarkable. IMPRESSION: Stable support apparatus. Stable bilateral lung opacities are noted most consistent with multifocal pneumonia. Electronically Signed   By: Marijo Conception M.D.   On: 03/17/2019 09:35   Dg Chest Port 1 View  Result Date: 03/16/2019 CLINICAL DATA:  Hypoxia EXAM: PORTABLE CHEST 1 VIEW COMPARISON:  03/14/2019 FINDINGS: Cardiac shadow is stable. Endotracheal tube, feeding catheter and right jugular temporary dialysis catheter are again seen. Right-sided PICC line is also stable in appearance. The lungs are and well aerated. Increased patchy infiltrates are seen bilaterally primarily within the right lung base. No pneumothorax or sizable  effusion is seen. IMPRESSION: Increase in patchy infiltrates bilaterally consistent with the patient's given clinical history of COVID-19 positivity. Electronically Signed   By: Inez Catalina M.D.   On: 03/16/2019 09:06       Resolved Hospital Problem list   Angioedema due to remdesivir, requirement for intubation> resolved Mild stridor 7/9> resolved  Assessment & Plan:  Acute encephalopathy> persistent, no improvement despite several days of hemodialysis, trying to minimize sedation Insomnia/ICU delirium Neuromuscular weakness \ 7/17 - unresponsive on  wake up assessment. Too unstable for CT head  PLAN Cancel cT head Dc diprivan due to worsening circulatory shock Add versed prn - if needed versed gtt Fent gtt to conitnue RASS goal -/3/-4   acute on chronic renal failure Continue CVVHD  COVID 19 pneumonia> some degree of lung injury noted on CXR/ABG S/p solumedrol 10 days, I don't think there is a role for using more Acute respiratory failure with hypoxemia due to inability to protect airway/COVID 19 pneumonia Remdesivir allergy Vent dyssynchrony/air hunger due to ARDS from COVID   03/17/2019 - > does not meet criteria for SBT/Extubation in setting of Acute Respiratory Failure due to MODs  PLNA -Full mechanical ventilator support   Circulatory shock  03/17/2019 - worsening shock with bradycardia  plan Levophed titrated to MAP > 65 No other pressors  Goals of care 7/14 - Dr Lake Bells did lengthy conversation with the patient's daughter today 03/14/2019.  I expressed my concern that her overall condition is poor and she is not doing very well with multiorgan failure.  I explained that any sort of recovery would take weeks to months.  I talked about CODE STATUS including whether or not it would be appropriate to perform CPR.  I explained that this would be unlikely to provide medical benefit and would cause harm.  At this point the patient's daughter said she wants everything done for her mother including CPR.  Is not clear that she has an understanding of the overall severity and poor prognosis despite me attempting to describe this over the last few days.  We will ask palliative medicine to call her to help discuss goals of care.  Overall I do not think that she has a good chance of surviving this hospitalization.  7/15 - Daughter did not want to engage with palliative care   7/16- called daughter - went to VM. Might have to make patient 2 person DNR   7/17 - long discussion with daughter - informed actively dying. For now  continue full medical care. She agreed to no cpr. She wants to physically visit her mother - informed Therapist, sports. Suspect she is coming to terms  Best practice:  Diet: continue tube feeding Pain/Anxiety/Delirium protocol (if indicated): RASS -3, continue fentanyl  VAP protocol (if indicated): yes DVT prophylaxis: sub q heparin GI prophylaxis: famotidine Glucose control: SSI Mobility: bed rest Code Status: full Family Communication: Delma Officer -  Updated - see above Disposition: remain in ICU     ATTESTATION & SIGNATURE   The patient Nichole Franklin is critically ill with multiple organ systems failure and requires high complexity decision making for assessment and support, frequent evaluation and titration of therapies, application of advanced monitoring technologies and extensive interpretation of multiple databases.   Critical Care Time devoted to patient care services described in this note is  30  Minutes. This time reflects time of care of this signee Dr Brand Males. This critical care time does not reflect procedure time, or teaching time or  supervisory time of PA/NP/Med student/Med Resident etc but could involve care discussion time     Dr. Brand Males, M.D., [facility].C.P Pulmonary and Critical Care Medicine Staff Physician [place] [facility] Pager: [phone], If no answer or between  15:00h - 7:00h: call [phone]  03/17/2019 11:31 AM

## 2019-03-17 NOTE — Progress Notes (Signed)
PROGRESS NOTE  Nichole Franklin QAS:[id number] DOB: 03-Jun-1951 DOA: 02/23/2019  PCP: Elwyn Reach, MD  Brief History/Interval Summary:  -Patient is a 68 y.o. female with PMHx of DM-2, HTN, dyslipidemia, CKD stage IV who presented to the hospital initially for evaluation of weakness, she was found to have fever and acute kidney injury.  She was subsequently admitted to [facility] her renal function stabilized-she was transferred to [facility] course since admission has been complicated by encephalopathy, angioedema following initiation of Remdesivir-and respiratory failure requiring intubation on 7/4.   Subjective/Interval History: Patient remains intubated and sedated.  No significant events overnight as discussed with staff  Assessment/Plan:  Acute Hypoxic Resp. Failure due to Acute Covid 19 Viral Illness/sepsis  Vent Mode: PRVC FiO2 (%):  [30 %-40 %] 30 % Set Rate:  [24 bmp] 24 bmp Vt Set:  [410 mL] 410 mL PEEP:  [5 cmH20-8 cmH20] 5 cmH20 Plateau Pressure:  [25 cmH20-33 cmH20] 33 cmH20     Component Value Date/Time   PHART 7.333 (L) 03/15/2019 0612   PCO2ART 49.6 (H) 03/15/2019 0612   PO2ART 66.0 (L) 03/15/2019 0612   HCO3 26.4 03/15/2019 0612   TCO2 28 03/15/2019 0612   ACIDBASEDEF 2.0 03/14/2019 0557   O2SAT 91.0 03/15/2019 0612    COVID-19 Labs  No results for input(s): DDIMER, FERRITIN, LDH, CRP in the last 72 hours.  Lab Results  Component Value Date   SARSCOV2NAA POSITIVE (A) 02/24/2019     Oxygen requirements: On mechanical ventilation.  40% FiO2.  Saturating in the 90s.     Antibiotics: Cefepime, day 11.  Vancomycin was added 7/12.   Remdesivir: Patient developed angioedema to Remdesivir Steroids: She has been taken off of steroids due to encephalopathy. Diuretics: She has received furosemide during this hospitalization. Last dose was on 7/9 Actemra: Did not receive Convalescent Plasma: Did not receive Vitamin C and  Zinc: Continue DVT Prophylaxis: On heparin 7500 units subcutaneously every 8 hours, which has been stopped given significant thrombocytopenia  Patient was initially intubated due to angioedema thought to be secondary to Remdesivir.  Angioedema resolved.  Patient was extubated on 7/8.  She did have some stridor at the same evening which was treated with nebulizer treatments and steroids.  This has improved.  She had significant altered mentation, which continued to be significantly encephalopathic, she was reintubated 7/10 for airway protection . -She remains significantly encephalopathic despite minimizing sedation, CT head  was normal on 7/10. - is difficult to wean in the setting of her encephalopathy, vent management per PCCM  Patient remains on mechanical ventilation.  Pulmonology is following.  Patient developed a fever couple of days ago.  Concern was for sepsis.  As well she is having significantly elevated white blood cell count, and procalcitonin, continue with empiric antibiotic coverage with vancomycin, cefepime, repeat blood cultures.  Blood cultures done on 7/11 remains negative.  Acute kidney injury on chronic kidney disease stage IV Baseline creatinine around 1.6-2.  She initially presented with a BUN of 81 and a creatinine of 7.09.  Renal function had improved.  But then it started getting worse again a few days ago.  She had a decrease in her urine output.  Nephrology was consulted.  Dialysis catheter was placed.  Patient was started on CRRT.  CRRT management per renal  Thrombocytopenia Patient with significant thrombocytopenia, antibody is pending, will hold subcu heparin for now,   low score probability for HIT, for now continue with heparin  through CRRT and for DVT prophylaxis.  Septic shock Continues to require Levophed.  Procalcitonin continues to increase despite starting broad-spectrum antibiotic coverage, she is with significant leukocytosis, repeat blood cultures remain  negative, all her lines are new, x-ray with no acute findings, could not identify source of her sepsis yet, remains with significantly elevated leukocytosis, but it is trending down .  Continue with broad-spectrum antibiotics cefepime and vancomycin  Acute metabolic encephalopathy She had encephalopathy earlier during the course of the hospitalization thought to be due to hypoxemia and other acute metabolic derangement.  CT scan done on 7/4 did not show any acute findings.  After extubation her mentation has been progressively getting worse.  She had a CT scan repeated on 7/10 which did not show any acute findings.  She had to be reintubated as she was not able to protect her airway.  Mentation could also be altered due to uremia.  Steroids were discontinued.  Her BUN has improved with CRRT.   Elevated d-dimer Patient was empirically started on IV heparin on 7/4 due to concern for VTE.  Due to drop in hemoglobin this was discontinued.  Lower extremity Doppler study was negative for DVT.  Clinically there was low suspicion for VTE.  D-dimer continues to trend down.    Angioedema Most likely secondary to Remdesivir.  Tongue swelling has improved.  Hypernatremia Resolved with free water.  Monitor electrolytes closely.  Diabetes mellitus type 2 with uncontrolled hyperglycemia HbA1c 9.3.  Patient is on Lantus insulin and SSI.  CBGs are reasonably well controlled.  Initially was on IV insulin.    Normocytic anemia  Patient has required transfusion of 3 PRBCs (7/6, 7/11, 7/13).  No evidence for overt bleeding noted.  Hemoglobin has responded appropriately to blood transfusion.  Nutrition Being fed through a feeding tube.   Goals of care: -Patient is critically ill, with multiorgan failure, she has made very little improvement over last couple weeks, remains significantly encephalopathic, vent dependent, unsuccessful weaning so far, bicytopenia, anemia, renal failure, respiratory failure,  encephalopathic, patient would not survive event of cardiac arrest, feels strongly that intervention of CVR would cause excessive exposure to COVID from caregiver, and mainly would not provide any medical benefit to this patient, I feel her CODE STATUS should be changed to DNR, I have discussed with her current Investment banker, corporate, previous Investment banker, corporate, both would feel the same. -CODE STATUS has been changed to partial   DVT Prophylaxis: Subcutaneous Heparin  PUD Prophylaxis: Protonix Code Status: Partial Family Communication: Pulmonology has been discussing with patient's family. Disposition Plan: Remain in ICU for now Anti-infectives (From admission, onward)   Start     Dose/Rate Route Frequency Ordered Stop   03/13/19 1400  vancomycin (VANCOCIN) IVPB 1000 mg/200 mL premix     1,000 mg 200 mL/hr over 60 Minutes Intravenous Every 24 hours 03/12/19 1151     03/12/19 1100  vancomycin (VANCOCIN) 1,750 mg in sodium chloride 0.9 % 500 mL IVPB     1,750 mg 250 mL/hr over 120 Minutes Intravenous  Once 03/12/19 1056 03/12/19 1500   03/11/19 2230  ceFEPIme (MAXIPIME) 2 g in sodium chloride 0.9 % 100 mL IVPB     2 g 200 mL/hr over 30 Minutes Intravenous Every 12 hours 03/11/19 2150     03/07/19 1000  ceFEPIme (MAXIPIME) 2 g in sodium chloride 0.9 % 100 mL IVPB  Status:  Discontinued     2 g 200 mL/hr over 30 Minutes Intravenous Every 24 hours  03/06/19 1434 03/11/19 2150   03/04/19 2200  ceFEPIme (MAXIPIME) 1 g in sodium chloride 0.9 % 100 mL IVPB  Status:  Discontinued     1 g 200 mL/hr over 30 Minutes Intravenous Every 12 hours 03/04/19 1256 03/06/19 1434   03/04/19 1400  remdesivir 100 mg in sodium chloride 0.9 % 250 mL IVPB  Status:  Discontinued     100 mg 500 mL/hr over 30 Minutes Intravenous Every 24 hours 03/03/19 1305 03/04/19 0936   03/04/19 1200  linezolid (ZYVOX) IVPB 600 mg  Status:  Discontinued     600 mg 300 mL/hr over 60 Minutes Intravenous Every 12 hours 03/04/19 1141 03/05/19  1100   03/04/19 0900  linezolid (ZYVOX) IVPB 600 mg  Status:  Discontinued     600 mg 300 mL/hr over 60 Minutes Intravenous Every 12 hours 03/04/19 0759 03/04/19 1141   03/04/19 0800  ceFEPIme (MAXIPIME) 2 g in sodium chloride 0.9 % 100 mL IVPB  Status:  Discontinued     2 g 200 mL/hr over 30 Minutes Intravenous Every 12 hours 03/04/19 0706 03/04/19 1256   03/03/19 1400  remdesivir 200 mg in sodium chloride 0.9 % 250 mL IVPB     200 mg 500 mL/hr over 30 Minutes Intravenous Once 03/03/19 1305 03/03/19 1500   02/27/19 2000  azithromycin (ZITHROMAX) 500 mg in sodium chloride 0.9 % 250 mL IVPB  Status:  Discontinued     500 mg 250 mL/hr over 60 Minutes Intravenous Every 24 hours 02/27/19 0428 03/04/19 1211   02/27/19 0430  cefTRIAXone (ROCEPHIN) 1 g in sodium chloride 0.9 % 100 mL IVPB  Status:  Discontinued     1 g 200 mL/hr over 30 Minutes Intravenous Every 24 hours 02/27/19 0428 03/04/19 0706      Medications:  Scheduled:  sodium chloride   Intravenous Once   sodium chloride   Intravenous Once   chlorhexidine gluconate (MEDLINE KIT)  15 mL Mouth Rinse BID   Chlorhexidine Gluconate Cloth  6 each Topical Q0600   cholecalciferol  1,000 Units Per Tube Daily   [START ON 03/19/2019] darbepoetin (ARANESP) injection - NON-DIALYSIS  150 mcg Subcutaneous Q Sun-1800   feeding supplement (PRO-STAT SUGAR FREE 64)  60 mL Per Tube TID   free water  300 mL Per Tube Q8H   insulin aspart  0-20 Units Subcutaneous Q4H   insulin glargine  20 Units Subcutaneous BID   ipratropium-albuterol  3 mL Nebulization Q6H   mouth rinse  15 mL Mouth Rinse 10 times per day   pantoprazole sodium  40 mg Per Tube Daily   polyethylene glycol  17 g Per Tube BID   senna  2 tablet Per Tube QHS   sodium chloride flush  10-40 mL Intracatheter Q12H   Continuous:   prismasol BGK 4/2.5 400 mL/hr at 03/16/19 1612    prismasol BGK 4/2.5 200 mL/hr at 03/16/19 1400   sodium chloride Stopped (03/16/19 0440)     [START ON 03/21/19] ceFEPime (MAXIPIME) IV     feeding supplement (VITAL HIGH PROTEIN) Stopped (03/17/19 1200)   fentaNYL infusion INTRAVENOUS 150 mcg/hr (03/17/19 1300)   heparin 10,000 units/ 20 mL infusion syringe 500 Units/hr (03/17/19 0600)   midazolam     norepinephrine (LEVOPHED) Adult infusion 24 mcg/min (03/17/19 1300)   prismasol BGK 4/2.5 1,800 mL/hr at 03/17/19 0900   vancomycin 1,000 mg (03/17/19 1418)   VWU:JWJXBJ chloride, albuterol, alteplase, bisacodyl, dextrose, EPINEPHrine, fentaNYL (SUBLIMAZE) injection, fentaNYL (SUBLIMAZE) injection, guaiFENesin-dextromethorphan, heparin, heparin,  lip balm, midazolam, ondansetron **OR** ondansetron (ZOFRAN) IV, sodium chloride, sodium chloride flush, sodium phosphate   Objective:  Vital Signs  Vitals:   03/17/19 1000 03/17/19 1100 03/17/19 1111 03/17/19 1200  BP: (!) 98/41 (!) 114/43 (!) 114/43 (!) 119/39  Pulse: (!) 52 (!) 52 (!) 51 (!) 52  Resp: (!) 24 20 (!) 27 17  Temp:    98 F (36.7 C)  TempSrc:    Oral  SpO2: 90% 96% 97% 94%  Weight:      Height:        Intake/Output Summary (Last 24 hours) at 03/17/2019 1501 Last data filed at 03/17/2019 1400 Gross per 24 hour  Intake 3337.65 ml  Output 2371 ml  Net 966.65 ml   Filed Weights   03/15/19 0434 03/16/19 0500 03/17/19 0500  Weight: 76.7 kg (!) 165.6 kg 73 kg    Sedated, intubated, in no apparent distress. Symmetrical Chest wall movement, Good air movement bilaterally, CTAB RRR,No Gallops,Rubs or new Murmurs, No Parasternal Heave +ve B.Sounds, Abd Soft, No tenderness, No rebound - guarding or rigidity. No Cyanosis, Clubbing or edema, No new Rash or bruise      Lab Results:  Data Reviewed: I have personally reviewed following labs and imaging studies  CBC: Recent Labs  Lab 03/13/19 0420  03/14/19 0420  03/15/19 0100 03/15/19 0612 03/16/19 0500 03/16/19 1620 03/17/19 0500  WBC 43.9*   < > 40.8*  --  38.6*  --  35.5* 38.3* 31.3*  NEUTROABS  37.2*  --  35.1*  --  33.8*  --  30.2*  --  26.9*  HGB 7.3*   < > 8.3*   < > 7.6* 8.5* 7.2* 7.2* 8.1*  HCT 24.8*   < > 27.7*   < > 25.9* 25.0* 24.7* 24.6* 26.8*  MCV 94.7   < > 96.9  --  97.0  --  98.4 98.4 97.8  PLT 116*   < > 97*  --  77*  --  51* 47* 42*   < > = values in this interval not displayed.    Basic Metabolic Panel: Recent Labs  Lab 03/13/19 0420  03/14/19 0420  03/15/19 0100 03/15/19 0612 03/15/19 1600 03/16/19 0500 03/16/19 1620 03/17/19 0500  NA 136   < > 136   < > 133* 132* 131* 134* 132* 135  K 5.1   < > 5.3*   < > 4.9 4.6 4.5 4.7 5.0 4.8  CL 101   < > 99   < > 98  --  97* 99 97* 99  CO2 26   < > 26   < > 26  --  '27 25 25 27  '$ GLUCOSE 164*   < > 156*   < > 207*  --  176* 110* 174* 140*  BUN 53*   < > 48*   < > 49*  --  44* 44* 34* 33*  CREATININE 1.62*   < > 1.48*   < > 1.26*  --  1.14* 1.02* 0.94 0.89  CALCIUM 8.3*   < > 8.7*   < > 8.9  --  9.0 8.8* 8.9 9.5  MG 2.3  --  2.5*  --  2.5*  --   --  2.3  --  2.6*  PHOS 4.1   < > 4.6   < > 3.1  --  3.3 3.2 3.0 2.6   < > = values in this interval not displayed.    GFR: Estimated Creatinine Clearance:  61.9 mL/min (by C-G formula based on SCr of 0.89 mg/dL).  Liver Function Tests: Recent Labs  Lab 03/11/19 0500  03/15/19 0100 03/15/19 1600 03/16/19 0500 03/16/19 1620 03/17/19 0500  AST 32  --   --   --   --   --   --   ALT 48*  --   --   --   --   --   --   ALKPHOS 146*  --   --   --   --   --   --   BILITOT 0.3  --   --   --   --   --   --   PROT 6.0*  --   --   --   --   --   --   ALBUMIN 2.1*   < > 1.8* 1.7* 1.8* 1.6* 1.7*   < > = values in this interval not displayed.     CBG: Recent Labs  Lab 03/16/19 1928 03/16/19 2332 03/17/19 0410 03/17/19 0710 03/17/19 1228  GLUCAP 153* 129* 129* 121* 98      Recent Results (from the past 240 hour(s))  Culture, blood (routine x 2)     Status: None   Collection Time: 03/11/19 10:18 PM   Specimen: BLOOD  Result Value Ref Range Status   Specimen  Description BLOOD RIGHT ANTECUBITAL  Final   Special Requests   Final    BOTTLES DRAWN AEROBIC AND ANAEROBIC Blood Culture adequate volume   Culture   Final    NO GROWTH 5 DAYS Performed at [facility], [place] [address], [place], [place] [address]    Report Status 03/17/2019 FINAL  Final  Culture, blood (routine x 2)     Status: None   Collection Time: 03/11/19 10:23 PM   Specimen: BLOOD  Result Value Ref Range Status   Specimen Description BLOOD RIGHT ANTECUBITAL  Final   Special Requests   Final    BOTTLES DRAWN AEROBIC AND ANAEROBIC Blood Culture adequate volume   Culture   Final    NO GROWTH 5 DAYS Performed at [facility], [place] [address] [place], [place] [address]    Report Status 03/17/2019 FINAL  Final  Culture, blood (routine x 2)     Status: None (Preliminary result)   Collection Time: 03/15/19  7:10 PM   Specimen: BLOOD  Result Value Ref Range Status   Specimen Description   Final    BLOOD RIGHT ANTECUBITAL Performed at [facility] [address] [place], [place] [address]    Special Requests   Final    BOTTLES DRAWN AEROBIC AND ANAEROBIC Blood Culture adequate volume Performed at [facility] [address], [place], [place] [address]    Culture   Final    NO GROWTH 2 DAYS Performed at [facility] [address], [place], [place] [address]    Report Status PENDING  Incomplete  Culture, blood (routine x 2)     Status: None (Preliminary result)   Collection Time: 03/15/19  7:20 PM   Specimen: BLOOD  Result Value Ref Range Status   Specimen Description   Final    BLOOD BLOOD RIGHT FOREARM Performed at [facility] [address], [place], [place] [address]    Special Requests   Final    BOTTLES DRAWN AEROBIC ONLY Blood Culture adequate volume Performed at [facility] [address], [place], [place] [address]  Culture   Final    NO GROWTH 2  DAYS Performed at [facility], [place] [address], [place], [place] [address]    Report Status PENDING  Incomplete      Radiology Studies: Dg Chest Port 1 View  Result Date: 03/17/2019 CLINICAL DATA:  Endotracheal tube present. EXAM: PORTABLE CHEST 1 VIEW COMPARISON:  Radiographs of March 16, 2019. FINDINGS: The heart size and mediastinal contours are within normal limits. Endotracheal and feeding tubes are unchanged in position. Right internal jugular catheter is unchanged in position. Right-sided PICC line is unchanged in position. No pneumothorax or pleural effusion is noted. Stable bilateral multifocal airspace opacities are noted. The visualized skeletal structures are unremarkable. IMPRESSION: Stable support apparatus. Stable bilateral lung opacities are noted most consistent with multifocal pneumonia. Electronically Signed   By: Marijo Conception M.D.   On: 03/17/2019 09:35   Dg Chest Port 1 View  Result Date: 03/16/2019 CLINICAL DATA:  Hypoxia EXAM: PORTABLE CHEST 1 VIEW COMPARISON:  03/14/2019 FINDINGS: Cardiac shadow is stable. Endotracheal tube, feeding catheter and right jugular temporary dialysis catheter are again seen. Right-sided PICC line is also stable in appearance. The lungs are and well aerated. Increased patchy infiltrates are seen bilaterally primarily within the right lung base. No pneumothorax or sizable effusion is seen. IMPRESSION: Increase in patchy infiltrates bilaterally consistent with the patient's given clinical history of COVID-19 positivity. Electronically Signed   By: Inez Catalina M.D.   On: 03/16/2019 09:06       LOS: 28 days   Phillips Climes MD  [facility] Pager on [email or web address]  03/17/2019, 3:01 PM

## 2019-03-17 NOTE — Progress Notes (Signed)
[facility] Progress Note  Subjective:   No meaningful change in status-  Notes reviewed- expecting this to be a terminal situation- per nursing there is a family meeting scheduled for later so family can see her.  She will be taken off of CRRT for that.  Is now no CPR, no escalation.  Nurse asking if we can take her off CRRT at that point "give her a break"  Then reassess tomorrow for further need   Vitals:   03/17/19 1000 03/17/19 1100 03/17/19 1111 03/17/19 1200  BP: (!) 98/41 (!) 114/43 (!) 114/43 (!) 119/39  Pulse: (!) 52 (!) 52 (!) 51 (!) 52  Resp: (!) 24 20 (!) 27 17  Temp:    98 F (36.7 C)  TempSrc:    Oral  SpO2: 90% 96% 97% 94%  Weight:      Height:        Inpatient medications: . sodium chloride   Intravenous Once  . sodium chloride   Intravenous Once  . chlorhexidine gluconate (MEDLINE KIT)  15 mL Mouth Rinse BID  . Chlorhexidine Gluconate Cloth  6 each Topical Q0600  . cholecalciferol  1,000 Units Per Tube Daily  . [START ON 03/19/2019] darbepoetin (ARANESP) injection - NON-DIALYSIS  150 mcg Subcutaneous Q Sun-1800  . feeding supplement (PRO-STAT SUGAR FREE 64)  60 mL Per Tube TID  . free water  300 mL Per Tube Q8H  . insulin aspart  0-20 Units Subcutaneous Q4H  . insulin glargine  20 Units Subcutaneous BID  . ipratropium-albuterol  3 mL Nebulization Q6H  . mouth rinse  15 mL Mouth Rinse 10 times per day  . pantoprazole sodium  40 mg Per Tube Daily  . polyethylene glycol  17 g Per Tube BID  . senna  2 tablet Per Tube QHS  . sodium chloride flush  10-40 mL Intracatheter Q12H   .  prismasol BGK 4/2.5 400 mL/hr at 03/16/19 1612  .  prismasol BGK 4/2.5 200 mL/hr at 03/16/19 1400  . sodium chloride Stopped (03/16/19 0440)  . ceFEPime (MAXIPIME) IV 2 g (03/17/19 0929)  . feeding supplement (VITAL HIGH PROTEIN) Stopped (03/17/19 1200)  . fentaNYL infusion INTRAVENOUS 100 mcg/hr (03/17/19 0800)  . heparin 10,000 units/ 20 mL infusion syringe 500 Units/hr  (03/17/19 0600)  . norepinephrine (LEVOPHED) Adult infusion 16 mcg/min (03/17/19 0800)  . prismasol BGK 4/2.5 1,800 mL/hr at 03/17/19 0900  . vancomycin 1,000 mg (03/16/19 1436)   sodium chloride, albuterol, alteplase, bisacodyl, dextrose, EPINEPHrine, fentaNYL (SUBLIMAZE) injection, fentaNYL (SUBLIMAZE) injection, guaiFENesin-dextromethorphan, heparin, heparin, lip balm, midazolam, ondansetron **OR** ondansetron (ZOFRAN) IV, sodium chloride, sodium chloride flush, sodium phosphate    Exam: Due to COVID restrictions and isolation- pt was not seen by in an effort to preserve PPE and to minimize exposure to providers and other patients.  Temp HD cath- right IJ placed 7/11   Home meds of note:  - lisinopril 20 qd/ hydrochlorothiazide 25 qd   - duloxetine 30 qd/ gabapentin 300 tid/ vicodin qid prn  - naproxen 440 bid prn/    UNa 76, UCr 58  (02/27/19)   Date                            Creat               eGFR  2009- 11/2016              1.3- 1.8  32- 50  CKD III  05/2017                        2.61                 21  CKD IV  02/16/2019- 03/11/19         7.09 >> 4.14    Renal US >  8.8/ 10 cm kidneys, ^echogenicity c/w CKD, no hydro  CXR 7/11 > Patchy bilateral opacities, left greater than right, worrisome for an infectious process  total I/O 6/29 - 7/10 = 28L in and 27.8 L out, +365  admit wt = 77kg, peak 82kg- now 79.8    FeSat 4% on 6/29, ferr 244   Assessment/ Plan: 1. AKI on CKDIV - baseline creat around 2.5 from 2018 which was CKD IV eGFR 6m/min.  Echogenic kidneys on [place] go along w/ advanced renal failure chronically.  Creat 7 on admission due to dehydration, improved to 2.9 then rose to 4.0 and started CRRT on 7/11.  Plan cont CRRT for now, heparin 500u/hr flat rate. kidney numbers have improved on CRRT.  Since CRRT will be held for the family meeting I am OK holding it until I see her again tomorrow to see if need to resume or will take cues from CCM to resume or not  to resume   2. Volume -  CVP - lower  CXR's not helpful w/ bilat infiltrates.  no sig edema on exam. On low dose pressors-  keeping even on machine as best we can- turning up a little positive which is fine.  3. DM2 on insulin 4. COVID-19 PNA / vent dep resp failure: per primary- WBC very high but improving   5. Anemia ckd + blood draws+ other: possibly CKD related in part, Hb 9 on admit , now 7's.  Low Fe sat 6/29. Will start esa - inc dose and IV Fe -(pharmacy told me we cannot give ) . Transfuse prn- given one unit on 7/11 and 7/13.  6. Dec MS- cannot blame on uremia with better numbers -  7. Hyponatremia- dec free water- improved  8. Very poor prognosis per CCM-  Family coming to terms- for family meeting toda 971 Low platelets- checking HIT panel- cont heparin b/c apparently her risk is low for HIT    [facility] 03/17/2019, 1:10 PM  Iron/TIBC/Ferritin/ %Sat    Component Value Date/Time   IRON 14 (L) 02/27/2019 0215   TIBC 350 02/27/2019 0215   FERRITIN 154 03/08/2019 0400   IRONPCTSAT 4 (L) 02/27/2019 0215   Recent Labs  Lab 03/17/19 0500  NA 135  K 4.8  CL 99  CO2 27  GLUCOSE 140*  BUN 33*  CREATININE 0.89  CALCIUM 9.5  PHOS 2.6  ALBUMIN 1.7*   Recent Labs  Lab 03/11/19 0500  AST 32  ALT 48*  ALKPHOS 146*  BILITOT 0.3  PROT 6.0*   Recent Labs  Lab 03/17/19 0500  WBC 31.3*  HGB 8.1*  HCT 26.8*  PLT 42*

## 2019-03-17 NOTE — Progress Notes (Signed)
Called & spoke with daughter stephanie. We discussed her mothers status. I am continuing to love on and care for her mother. Goals of care were also discussed. That will be folowed up with the doctor. Emotional support ongoing for pt and family

## 2019-03-17 NOTE — Progress Notes (Signed)
Hypoglycemic Event  CBG: 43  Treatment: D50 50 mL (25 gm) 1/2 amp D50 given. Paged [place]. Will recheck at 2330  Symptoms: None UTA. Patient sedated with ventilator   Follow-up CBG: Time:2330 CBG Result:57 Repeat intervention  Possible Reasons for Event: Inadequate meal intake and Other: multi organ failure per md order Tube feeding stopped today for possible aspiration also multi system organ failure per MD notes  Comments/MD notified:paged MD    Cicily Bonano R RN-BSN  [facility]

## 2019-03-17 NOTE — Progress Notes (Signed)
Stopped and recirculated the blood in CRRT. CRRT will remain on hold for renal to reassess tomorrow per Dr Clover Mealy. Pt ready for family visit. At 1800. Emotional support and comfort measures.

## 2019-03-17 NOTE — Progress Notes (Signed)
Spoke with stephanie to organize a visit to she her mother. This will occur at 1800. emothional support ongoing

## 2019-03-18 LAB — GLUCOSE, CAPILLARY: Glucose-Capillary: 70 mg/dL (ref 70–99)

## 2019-03-20 LAB — TYPE AND SCREEN
ABO/RH(D): A POS
Antibody Screen: NEGATIVE
Unit division: 0
Unit division: 0

## 2019-03-20 LAB — BPAM RBC
Blood Product Expiration Date: 202008102359
Blood Product Expiration Date: 202008102359
ISSUE DATE / TIME: 202007162231
Unit Type and Rh: 6200
Unit Type and Rh: 6200

## 2019-03-20 LAB — CULTURE, BLOOD (ROUTINE X 2)
Culture: NO GROWTH
Culture: NO GROWTH
Special Requests: ADEQUATE
Special Requests: ADEQUATE

## 2019-03-20 LAB — GLUCOSE, CAPILLARY: Glucose-Capillary: 37 mg/dL — CL (ref 70–99)

## 2019-04-01 NOTE — Progress Notes (Addendum)
Wasted 225 ml of fentanyl and 52ml of Versed with Heide Scales RN.  Patient placement updated on patient transport to morgue. Funeral home unknown patient placement will follow up.  Patient had grey cane with purple flowers and a teal handle placed with her in body bag. Noted by myself and observed by Heide Scales RN

## 2019-04-01 NOTE — Progress Notes (Addendum)
Patients heart rate in 30's and bp 55/29. Called patients daughter Nichole Franklin and updated her on mothers condition, and the possibility of end of life transition. Nichole Franklin was able to say her final well wishes to her mother, and expressed gratitude for care. Nichole Franklin asked to update her if her mother passes. Provided emotional support to patient and her family. Will continue to provide support through this transition.

## 2019-04-01 NOTE — Death Summary Note (Signed)
DEATH SUMMARY   Patient Details  Name: Nichole Franklin MRN: [id number] DOB: May 29, 1951  Admission/Discharge Information   Admit Date:  2019-02-27  Date of Death: Date of Death: March 19, 2019  Time of Death: Time of Death: 0118  Length of Stay: November 19, 2022  Referring Physician: Elwyn Reach, MD   Reason(s) for Hospitalization    Acute Hypoxic Resp. Failure due to Acute Covid 19 Viral Illness/sepsis   Acute kidney injury on chronic kidney disease stage IV  Thrombocytopenia  Septic shock  Acute metabolic encephalopathy  Elevated d-dimer  Angioedema  Hypernatremia  Diabetes mellitus type 2 with uncontrolled hyperglycemia  Normocytic anemia     Diagnoses  Preliminary cause of death:  Secondary Diagnoses (including complications and co-morbidities):  Principal Problem:   Acute respiratory failure with hypoxia ([place]) Active Problems:   Acute renal failure (ARF) ([facility])   DM (diabetes mellitus), type 2 with renal complications ([facility])   Normocytic anemia   Pneumonia due to COVID-19 virus   Essential hypertension   HLD (hyperlipidemia)   Acute metabolic encephalopathy   High anion gap metabolic acidosis   AKI (acute kidney injury) ([place])   Pressure injury of skin   Brief Hospital Course (including significant findings, care, treatment, and services provided and events leading to death)  [name] is a 68 y.o. year old female with PMHx of DM-2, HTN, dyslipidemia, CKD stage IV who presented to the hospital initially for evaluation of weakness, she was found to have fever and acute kidney injury. She was subsequently admitted to [facility] her renal function stabilized-she was transferred to [facility] course since admission has been complicated by encephalopathy, angioedema following initiation of Remdesivir-and respiratory failure requiring intubation on 7/4. Angioedema resolved.  Patient was extubated on 7/8.  She did have some stridor at  the same evening which was treated with nebulizer treatments and steroids.  This has improved.  She had significant altered mentation, which continued to be significantly encephalopathic, she was reintubated 7/10 for airway protection . She remains significantly encephalopathic despite minimizing sedation, CT head  was normal on 7/10.she is difficult to wean in the setting of her encephalopathy, vent management per PCCM, Patient developed  sepsis.  As well she is having significantly elevated white blood cell count, and procalcitonin, she was started on  empiric antibiotic coverage with vancomycin, cefepime, will patient with oliguric renal failure, required CRRT during ICU stay, patient with multiorgan failure, circulatory, renal, respiratory, requiring pressors, vent support, CRRT, PCCM discussed goals of care, and overall poor prognosis, CODE STATUS changed to DNR, and family were able to visit patient, patient passed away 2019-03-19.   Pertinent Labs and Studies  Significant Diagnostic Studies Dg Chest 1 View  Result Date: 03/10/2019 CLINICAL DATA:  Evaluate ETT placement EXAM: CHEST  1 VIEW COMPARISON:  March 09, 2019 FINDINGS: The ETT terminates 1.8 cm above the carina. The feeding tube terminates below today's film. A left central line is stable in appropriate position. No pneumothorax. Hazy opacities in the lungs, left greater than right are stable. The cardiomediastinal silhouette is stable. IMPRESSION: 1. The ETT terminates 1.8 cm above the carina. Recommend withdrawing 1 cm. Other support apparatus as above. 2. Mild hazy opacity remains in the lungs, particularly the left lung. This could represent atypical infection or asymmetric pulmonary edema. Electronically Signed   By: Dorise Bullion III M.D   On: 03/10/2019 18:33   Dg Abd 1 View  Result Date: 03/04/2019 CLINICAL DATA:  Orogastric placement EXAM: ABDOMEN -  1 VIEW COMPARISON:  None. FINDINGS: Orogastric tube enters the stomach in has its tip  in the fundus. Gas pattern is unremarkable. IMPRESSION: Orogastric tube tip in the fundus of the stomach. Electronically Signed   By: Nelson Chimes M.D.   On: 03/04/2019 11:40   Ct Head Wo Contrast  Result Date: 03/10/2019 CLINICAL DATA:  Altered level of consciousness. EXAM: CT HEAD WITHOUT CONTRAST TECHNIQUE: Contiguous axial images were obtained from the base of the skull through the vertex without intravenous contrast. COMPARISON:  March 04, 2019 FINDINGS: Brain: No subdural, epidural, or subarachnoid hemorrhage. Cerebellum, brainstem, and basal cisterns normal. Ventricles and sulci are unremarkable. Scattered white matter changes remain. No acute cortical ischemia or infarct. No mass effect or midline shift. Vascular: Calcified atherosclerosis is seen in the intracranial carotids. Skull: Normal. Negative for fracture or focal lesion. Sinuses/Orbits: There is debris in the sphenoid sinuses. Paranasal sinuses are otherwise normal. Middle ears are well aerated. The right mastoid air cells are well aerated. There is fluid in a few left mastoid air cells without erosion or overlying soft tissue swelling. Other: None. IMPRESSION: 1. No acute intracranial abnormalities. Scattered white matter changes. Electronically Signed   By: Dorise Bullion III M.D   On: 03/10/2019 13:50   Ct Head Wo Contrast  Result Date: 03/04/2019 CLINICAL DATA:  68 year old female with COVID-19.  Confusion. EXAM: CT HEAD WITHOUT CONTRAST TECHNIQUE: Contiguous axial images were obtained from the base of the skull through the vertex without intravenous contrast. COMPARISON:  Cervical spine radiographs 08/07/2010. FINDINGS: Brain: No midline shift, mass effect, or evidence of intracranial mass lesion. No ventriculomegaly. No acute intracranial hemorrhage identified. Patchy bilateral cerebral white matter hypodensity most pronounced at the frontal horns. Deep gray matter nuclei, brainstem and cerebellum appear negative. No cortical  encephalomalacia. No cortically based acute infarct identified. Vascular: Calcified atherosclerosis at the skull base. No suspicious intracranial vascular hyperdensity. Skull: No acute osseous abnormality identified. Sinuses/Orbits: Trace sinus mucosal thickening, most pronounced in the right sphenoid. The tympanic cavities are clear. There are mild bilateral mastoid effusions. Other: The patient is intubated on the scout view. There is fluid in the visible pharynx. No acute orbit or scalp soft tissue findings. IMPRESSION: 1.  No acute intracranial abnormality. 2. Mild to moderate for age nonspecific white matter changes, most commonly due to chronic small vessel disease. Electronically Signed   By: Genevie Ann M.D.   On: 03/04/2019 18:15   US Renal  Result Date: 03/11/2019 CLINICAL DATA:  Acute renal failure EXAM: RENAL / URINARY TRACT ULTRASOUND COMPLETE COMPARISON:  None. FINDINGS: Right Kidney: Renal measurements: 10.2 x 5.1 x 5.2 cm = volume: 141.4 mL. Increased cortical echogenicity Left Kidney: Renal measurements: 8.8 x 5.3 x 5.6 cm = volume: 136.1 mL. Increased cortical echogenicity. Limited evaluation. Bladder: Not visualized on today's study. IMPRESSION: 1. The bladder was not visualized on today's study. 2. Evaluation of the left kidney is limited. Within this limitation, the kidneys demonstrate increased cortical echogenicity suggesting medical renal disease. No hydronephrosis noted. Electronically Signed   By: Dorise Bullion III M.D   On: 03/11/2019 16:36   Dg Chest Port 1 View  Result Date: 03/17/2019 CLINICAL DATA:  Endotracheal tube present. EXAM: PORTABLE CHEST 1 VIEW COMPARISON:  Radiographs of March 16, 2019. FINDINGS: The heart size and mediastinal contours are within normal limits. Endotracheal and feeding tubes are unchanged in position. Right internal jugular catheter is unchanged in position. Right-sided PICC line is unchanged in position. No pneumothorax or pleural  effusion is noted.  Stable bilateral multifocal airspace opacities are noted. The visualized skeletal structures are unremarkable. IMPRESSION: Stable support apparatus. Stable bilateral lung opacities are noted most consistent with multifocal pneumonia. Electronically Signed   By: Marijo Conception M.D.   On: 03/17/2019 09:35   Dg Chest Port 1 View  Result Date: 03/16/2019 CLINICAL DATA:  Hypoxia EXAM: PORTABLE CHEST 1 VIEW COMPARISON:  03/14/2019 FINDINGS: Cardiac shadow is stable. Endotracheal tube, feeding catheter and right jugular temporary dialysis catheter are again seen. Right-sided PICC line is also stable in appearance. The lungs are and well aerated. Increased patchy infiltrates are seen bilaterally primarily within the right lung base. No pneumothorax or sizable effusion is seen. IMPRESSION: Increase in patchy infiltrates bilaterally consistent with the patient's given clinical history of COVID-19 positivity. Electronically Signed   By: Inez Catalina M.D.   On: 03/16/2019 09:06   Dg Chest Port 1 View  Result Date: 03/14/2019 CLINICAL DATA:  68 year old female with COVID-19, decreased breath sounds. EXAM: PORTABLE CHEST 1 VIEW COMPARISON:  03/13/2019. FINDINGS: Portable AP semi upright view at 0053 hours. ET tube tip just below the clavicles. Enteric feeding tube courses to the abdomen, tip not included. Stable right IJ central line and PICC line. Stable to mildly larger lung volumes. Normal cardiac size and mediastinal contours. Stable to mildly regressed patchy perihilar and peripheral bilateral pulmonary opacity, greater on the left. No pneumothorax, pulmonary edema, pleural effusion or areas of worsening ventilation. Paucity of bowel gas in the upper abdomen. No acute osseous abnormality identified. IMPRESSION: 1. Stable lines and tubes. 2. Stable to mildly improved ventilation since yesterday. No new cardiopulmonary abnormality. Electronically Signed   By: Genevie Ann M.D.   On: 03/14/2019 01:14   Dg Chest Port 1  View  Result Date: 03/13/2019 CLINICAL DATA:  Pneumonia due to COVID-19 virus. EXAM: PORTABLE CHEST 1 VIEW COMPARISON:  Radiograph of March 11, 2019. FINDINGS: The heart size and mediastinal contours are within normal limits. Endotracheal and feeding tubes are unchanged in position. Right internal jugular catheter is unchanged in position. Left-sided internal jugular catheter has been removed. Interval placement of right-sided PICC line with distal tip in expected position of cavoatrial junction. Increased bilateral lung opacities are noted consistent with worsening multifocal pneumonia. The visualized skeletal structures are unremarkable. IMPRESSION: Interval placement of right sided PICC line. Otherwise stable support apparatus. Worsening bilateral lung opacities are noted most consistent with worsening multifocal pneumonia. Electronically Signed   By: Marijo Conception M.D.   On: 03/13/2019 08:10   Dg Chest Port 1 View  Result Date: 03/11/2019 CLINICAL DATA:  Central line placement EXAM: PORTABLE CHEST 1 VIEW COMPARISON:  March 10, 2019 FINDINGS: A new right central line terminates in the central SVC. No pneumothorax. The left central line is stable probably terminating in the left brachiocephalic vein. The ETT is in good position. The NG tube terminates below today's film. Cardiomediastinal silhouette is stable. Hazy opacities in the lungs, left greater than right, are stable. IMPRESSION: 1. Support apparatus as above. New right central line in good position. No pneumothorax. 2. Patchy bilateral opacities, left greater than right, worrisome for an infectious process. Electronically Signed   By: Dorise Bullion III M.D   On: 03/11/2019 17:11   Dg Chest Port 1 View  Result Date: 03/09/2019 CLINICAL DATA:  Pneumonia EXAM: PORTABLE CHEST 1 VIEW COMPARISON:  03/08/2019 FINDINGS: Interval removal of the endotracheal tube. Feeding tube in satisfactory position. Left jugular central venous catheter in unchanged  position. Peripheral hazy airspace disease in the left lung similar in appearance to the prior examination. No pleural effusion or pneumothorax. Stable cardiomediastinal silhouette. No aggressive osseous lesion. IMPRESSION: Stable peripheral hazy area airspace disease in the left lung likely secondary to an infectious etiology. Electronically Signed   By: Kathreen Devoid   On: 03/09/2019 10:19   Dg Chest Port 1 View  Result Date: 03/08/2019 CLINICAL DATA:  COVID-19 pneumonia.  Endotracheal tube present. EXAM: PORTABLE CHEST 1 VIEW COMPARISON:  03/07/2019 and 03/04/2019 FINDINGS: Endotracheal tube tip is 5 cm above the carina. NG tube tip is in the fundus of the stomach. Central venous catheter tip is in the superior vena cava at the level of the azygos vein, unchanged. Faint peripheral infiltrates in the left lung appears slightly improved. Right lung is clear. No effusion. Heart size and vascularity are normal. IMPRESSION: Slight improvement in the peripheral infiltrates in the left lung. Electronically Signed   By: Lorriane Shire M.D.   On: 03/08/2019 07:04   Dg Chest Port 1 View  Result Date: 03/07/2019 CLINICAL DATA:  Shortness of breath. EXAM: PORTABLE CHEST 1 VIEW COMPARISON:  03/04/2019. FINDINGS: Endotracheal tube, left IJ line, NG tube in stable position. Heart size stable. Unchanged bilateral interstitial prominence. Improved aeration in both lung bases. No pleural effusion or pneumothorax. IMPRESSION: 1.  Lines and tubes in stable position. 2. Persistent bilateral interstitial prominence unchanged. Improved aeration both lung bases. Electronically Signed   By: Marcello Moores  Register   On: 03/07/2019 06:43   Dg Chest Port 1 View  Result Date: 03/04/2019 CLINICAL DATA:  COVID-19 positive. Endotracheal and enteric tube placement. EXAM: PORTABLE CHEST 1 VIEW COMPARISON:  Chest x-ray dated February 28, 2019. FINDINGS: Interval placement of an endotracheal tube with the tip 3.0 cm above the carina. New enteric tube  entering the stomach with the tip in the fundus. New left internal jugular central venous catheter with tip in the proximal SVC. Stable cardiomediastinal silhouette. Normal pulmonary vascularity. Patchy bilateral airspace disease has improved. New left basilar atelectasis. No pleural effusion or pneumothorax. No acute osseous abnormality. IMPRESSION: 1. Appropriately positioned endotracheal and enteric tubes. 2. Improving COVID-19 pneumonia. Electronically Signed   By: Titus Dubin M.D.   On: 03/04/2019 11:42   Dg Chest Port 1 View  Result Date: 02/28/2019 CLINICAL DATA:  Hypoxia, COVID-19 EXAM: PORTABLE CHEST 1 VIEW COMPARISON:  Portable exam 0711 hours compared to 02/27/2019 FINDINGS: Stable heart size mediastinal contours. Patchy airspace infiltrates bilaterally, LEFT greater than RIGHT, consistent with multifocal pneumonia and diagnosis of COVID-19. Infiltrates appear mildly increased since previous exam. No pleural effusion or pneumothorax. Bones demineralized. IMPRESSION: BILATERAL airspace infiltrates consistent with pneumonia and diagnosis of COVID-19, mildly increased since previous exam. Electronically Signed   By: Lavonia Dana M.D.   On: 02/28/2019 07:38   Dg Chest Port 1 View  Result Date: 02/09/2019 CLINICAL DATA:  Shortness of breath and cough. EXAM: PORTABLE CHEST 1 VIEW COMPARISON:  02/17/2011 FINDINGS: Patchy bilateral pulmonary opacity with interstitial coarsening. Normal heart size for technique. No effusion or pneumothorax. IMPRESSION: Patchy bilateral infiltrate concerning for pneumonia-especially COVID-19. Electronically Signed   By: Monte Fantasia M.D.   On: 02/22/2019 13:04   Ct Renal Stone Study  Result Date: 02/21/2019 CLINICAL DATA:  Shortness of breath, dry cough, COVID-19, renal failure EXAM: CT ABDOMEN AND PELVIS WITHOUT CONTRAST TECHNIQUE: Multidetector CT imaging of the abdomen and pelvis was performed following the standard protocol without IV contrast. COMPARISON:   05/08/2017 FINDINGS: Lower  chest: Extensive, multifocal, predominantly subpleural ground-glass pulmonary opacity of the bilateral lung bases. Hepatobiliary: No focal liver abnormality is seen. Status post cholecystectomy. No biliary dilatation. Pancreas: Unremarkable. No pancreatic ductal dilatation or surrounding inflammatory changes. Spleen: Normal in size without significant abnormality. Adrenals/Urinary Tract: Adrenal glands are unremarkable. Kidneys are normal, without renal calculi, solid lesion, or hydronephrosis. Bladder is unremarkable. Stomach/Bowel: Stomach is within normal limits. Appendix appears normal. No evidence of bowel wall thickening, distention, or inflammatory changes. Colonic diverticulosis. Vascular/Lymphatic: Aortic atherosclerosis. No enlarged abdominal or pelvic lymph nodes. Reproductive: Status post hysterectomy. Other: No abdominal wall hernia or abnormality. No abdominopelvic ascites. Musculoskeletal: There are partially sclerotic inferior endplate deformities of the T11 and T12 vertebral bodies, new compared to prior examination. IMPRESSION: 1. No acute noncontrast CT findings of the abdomen or pelvis to explain renal failure. No evidence of urinary tract calculus or hydronephrosis. 2. Extensive, multifocal, predominantly subpleural ground-glass pulmonary opacity of the bilateral lung bases, findings in keeping with reported diagnosis of COVID-19. 3. There are partially sclerotic inferior endplate deformities of the T11 and T12 vertebral bodies, new compared to prior examination, although of uncertain acuity. Correlate for acute pain and point tenderness. 4. Other chronic, incidental, and postoperative findings as detailed above. Electronically Signed   By: Eddie Candle M.D.   On: 02/27/2019 14:58   Vas [place] Lower Extremity Venous (dvt)  Result Date: 03/06/2019  Lower Venous Study Indications: Swelling, Edema, and Fever and Positive Covid 19.  Comparison Study: No prior. Performing  Technologist: Oda Cogan RDMS, RVT  Examination Guidelines: A complete evaluation includes B-mode imaging, spectral Doppler, color Doppler, and power Doppler as needed of all accessible portions of each vessel. Bilateral testing is considered an integral part of a complete examination. Limited examinations for reoccurring indications may be performed as noted.  +---------+---------------+---------+-----------+----------+-------+ RIGHT    CompressibilityPhasicitySpontaneityPropertiesSummary +---------+---------------+---------+-----------+----------+-------+ CFV      Full           Yes      Yes                          +---------+---------------+---------+-----------+----------+-------+ SFJ      Full                                                 +---------+---------------+---------+-----------+----------+-------+ FV Prox  Full                                                 +---------+---------------+---------+-----------+----------+-------+ FV Mid   Full                                                 +---------+---------------+---------+-----------+----------+-------+ FV DistalFull                                                 +---------+---------------+---------+-----------+----------+-------+ PFV      Full                                                 +---------+---------------+---------+-----------+----------+-------+  POP      Full           Yes      Yes                          +---------+---------------+---------+-----------+----------+-------+ PTV      Full                                                 +---------+---------------+---------+-----------+----------+-------+ PERO     Full                                                 +---------+---------------+---------+-----------+----------+-------+   +---------+---------------+---------+-----------+----------+-------+ LEFT      CompressibilityPhasicitySpontaneityPropertiesSummary +---------+---------------+---------+-----------+----------+-------+ CFV      Full           Yes      Yes                          +---------+---------------+---------+-----------+----------+-------+ SFJ      Full                                                 +---------+---------------+---------+-----------+----------+-------+ FV Prox  Full                                                 +---------+---------------+---------+-----------+----------+-------+ FV Mid   Full                                                 +---------+---------------+---------+-----------+----------+-------+ FV DistalFull                                                 +---------+---------------+---------+-----------+----------+-------+ PFV      Full                                                 +---------+---------------+---------+-----------+----------+-------+ POP      Full           Yes      Yes                          +---------+---------------+---------+-----------+----------+-------+ PTV      Full                                                 +---------+---------------+---------+-----------+----------+-------+ PERO  Full                                                 +---------+---------------+---------+-----------+----------+-------+     Summary: Right: There is no evidence of deep vein thrombosis in the lower extremity. Left: There is no evidence of deep vein thrombosis in the lower extremity.  *See table(s) above for measurements and observations. Electronically signed by Ruta Hinds MD on 03/06/2019 at 8:38:47 PM.    Final    [place] Ekg Site Rite  Result Date: 03/11/2019 If [facility] image not attached, placement could not be confirmed due to current cardiac rhythm.   Microbiology Recent Results (from the past 240 hour(s))  Culture, blood (routine x 2)     Status: None   Collection Time:  03/11/19 10:18 PM   Specimen: BLOOD  Result Value Ref Range Status   Specimen Description BLOOD RIGHT ANTECUBITAL  Final   Special Requests   Final    BOTTLES DRAWN AEROBIC AND ANAEROBIC Blood Culture adequate volume   Culture   Final    NO GROWTH 5 DAYS Performed at [facility], [address], [place], [place] [address]    Report Status 03/17/2019 FINAL  Final  Culture, blood (routine x 2)     Status: None   Collection Time: 03/11/19 10:23 PM   Specimen: BLOOD  Result Value Ref Range Status   Specimen Description BLOOD RIGHT ANTECUBITAL  Final   Special Requests   Final    BOTTLES DRAWN AEROBIC AND ANAEROBIC Blood Culture adequate volume   Culture   Final    NO GROWTH 5 DAYS Performed at [facility], [place] [address], [place], [place] [address]    Report Status 03/17/2019 FINAL  Final  Culture, blood (routine x 2)     Status: None (Preliminary result)   Collection Time: 03/15/19  7:10 PM   Specimen: BLOOD  Result Value Ref Range Status   Specimen Description   Final    BLOOD RIGHT ANTECUBITAL Performed at [place] [address] [place], [place] [address]    Special Requests   Final    BOTTLES DRAWN AEROBIC AND ANAEROBIC Blood Culture adequate volume Performed at [place] [address] [place], [place] [address]    Culture   Final    NO GROWTH 3 DAYS Performed at [facility], [place] [address], [place], [place] [address]    Report Status PENDING  Incomplete  Culture, blood (routine x 2)     Status: None (Preliminary result)   Collection Time: 03/15/19  7:20 PM   Specimen: BLOOD RIGHT FOREARM  Result Value Ref Range Status   Specimen Description   Final    BLOOD RIGHT FOREARM Performed at [facility], [place] [address], [place], [place] [address]    Special Requests   Final    BOTTLES DRAWN AEROBIC ONLY Blood Culture adequate volume Performed at [place] [address], [place], [place] [address]    Culture   Final    NO GROWTH 3 DAYS Performed at [facility], [place] [address], [place],  [address]    Report Status PENDING  Incomplete    Lab Basic Metabolic Panel: Recent Labs  Lab 03/13/19 0420  03/14/19 0420  03/15/19 0100  03/15/19 1600 03/16/19 0500 03/16/19 1620 03/17/19 0500 03/17/19 2120  NA 136   < > 136   < > 133*   < > 131* 134* 132* 135 137  K 5.1   < > 5.3*   < > 4.9   < > 4.5 4.7 5.0 4.8 4.8  CL 101   < > 99   < > 98  --  97* 99 97* 99 103  CO2 26   < > 26   < > 26  --  27 25 25 27 24   GLUCOSE 164*   < > 156*   < > 207*  --  176* 110* 174* 140* 64*  BUN 53*   < > 48*   < > 49*  --  44* 44* 34* 33* 34*  CREATININE 1.62*   < > 1.48*   < > 1.26*  --  1.14* 1.02* 0.94 0.89 1.20*  CALCIUM 8.3*   < > 8.7*   < > 8.9  --  9.0 8.8* 8.9 9.5 9.0  MG 2.3  --  2.5*  --  2.5*  --   --  2.3  --  2.6*  --   PHOS 4.1   < > 4.6   < > 3.1  --  3.3 3.2 3.0 2.6 4.0   < > = values in this interval not displayed.   Liver Function Tests: Recent Labs  Lab 03/15/19 1600 03/16/19 0500 03/16/19 1620 03/17/19 0500 03/17/19 2120  ALBUMIN 1.7* 1.8* 1.6* 1.7* 1.4*   No results for input(s): LIPASE, AMYLASE in the last 168 hours. No results for input(s): AMMONIA in the last 168 hours. CBC: Recent Labs  Lab 03/13/19 0420  03/14/19 0420  03/15/19 0100 03/15/19 0612 03/16/19 0500 03/16/19 1620 03/17/19 0500  WBC 43.9*   < > 40.8*  --  38.6*  --  35.5* 38.3* 31.3*  NEUTROABS 37.2*  --  35.1*  --  33.8*  --  30.2*  --  26.9*  HGB 7.3*   < > 8.3*   < > 7.6* 8.5* 7.2* 7.2* 8.1*  HCT 24.8*   < > 27.7*   < > 25.9* 25.0* 24.7* 24.6* 26.8*  MCV 94.7   < > 96.9  --  97.0  --  98.4 98.4 97.8  PLT 116*   < > 97*  --  77*  --  51* 47* 42*   < > = values in this interval not displayed.   Cardiac Enzymes: No results for input(s): CKTOTAL, CKMB, CKMBINDEX, TROPONINI in the last 168 hours. Sepsis Labs: Recent Labs  Lab 03/13/19 0420   03/14/19 0420 03/15/19 0100 03/16/19 0500 03/16/19 1620 03/17/19 0500  PROCALCITON 23.09  --  21.69  --  34.60  --  36.94  WBC 43.9*   < > 40.8* 38.6* 35.5* 38.3* 31.3*  LATICACIDVEN 1.8  --   --   --   --   --   --    < > = values in this interval not displayed.    Procedures/Operations   July 4 left IJ CVL July 4 endotracheal tube> 7/8 July 10 ETT >  July 8 Cor-Trak gastric tube July 11 right internal jugular hemodialysis catheter   Amit Meloy 13-Apr-2019, 5:10 PM

## 2019-04-01 DEATH — deceased
